# Patient Record
Sex: Female | Born: 1935 | Race: Black or African American | Hispanic: No | Smoking: Never smoker
Health system: Southern US, Community
[De-identification: ages and names within clinical notes are randomized; demographics above are authoritative.]

## PROBLEM LIST (undated history)

## (undated) DIAGNOSIS — G309 Alzheimer's disease, unspecified: Secondary | ICD-10-CM

## (undated) DIAGNOSIS — N289 Disorder of kidney and ureter, unspecified: Secondary | ICD-10-CM

## (undated) DIAGNOSIS — R51 Headache: Secondary | ICD-10-CM

## (undated) DIAGNOSIS — I639 Cerebral infarction, unspecified: Secondary | ICD-10-CM

## (undated) DIAGNOSIS — E559 Vitamin D deficiency, unspecified: Secondary | ICD-10-CM

## (undated) DIAGNOSIS — M1990 Unspecified osteoarthritis, unspecified site: Secondary | ICD-10-CM

## (undated) DIAGNOSIS — G459 Transient cerebral ischemic attack, unspecified: Secondary | ICD-10-CM

## (undated) DIAGNOSIS — F419 Anxiety disorder, unspecified: Secondary | ICD-10-CM

## (undated) DIAGNOSIS — I10 Essential (primary) hypertension: Secondary | ICD-10-CM

## (undated) DIAGNOSIS — F32A Depression, unspecified: Secondary | ICD-10-CM

## (undated) DIAGNOSIS — R443 Hallucinations, unspecified: Secondary | ICD-10-CM

## (undated) DIAGNOSIS — F329 Major depressive disorder, single episode, unspecified: Secondary | ICD-10-CM

## (undated) DIAGNOSIS — F0390 Unspecified dementia without behavioral disturbance: Secondary | ICD-10-CM

## (undated) DIAGNOSIS — K219 Gastro-esophageal reflux disease without esophagitis: Secondary | ICD-10-CM

## (undated) DIAGNOSIS — R519 Headache, unspecified: Secondary | ICD-10-CM

## (undated) DIAGNOSIS — F0280 Dementia in other diseases classified elsewhere without behavioral disturbance: Secondary | ICD-10-CM

## (undated) DIAGNOSIS — E539 Vitamin B deficiency, unspecified: Secondary | ICD-10-CM

## (undated) DIAGNOSIS — R009 Unspecified abnormalities of heart beat: Secondary | ICD-10-CM

## (undated) HISTORY — DX: Depression, unspecified: F32.A

## (undated) HISTORY — DX: Alzheimer's disease, unspecified: G30.9

## (undated) HISTORY — DX: Dementia in other diseases classified elsewhere, unspecified severity, without behavioral disturbance, psychotic disturbance, mood disturbance, and anxiety: F02.80

## (undated) HISTORY — DX: Hallucinations, unspecified: R44.3

## (undated) HISTORY — DX: Headache: R51

## (undated) HISTORY — DX: Unspecified abnormalities of heart beat: R00.9

## (undated) HISTORY — DX: Gastro-esophageal reflux disease without esophagitis: K21.9

## (undated) HISTORY — DX: Unspecified osteoarthritis, unspecified site: M19.90

## (undated) HISTORY — DX: Anxiety disorder, unspecified: F41.9

## (undated) HISTORY — DX: Disorder of kidney and ureter, unspecified: N28.9

## (undated) HISTORY — DX: Vitamin B deficiency, unspecified: E53.9

## (undated) HISTORY — DX: Major depressive disorder, single episode, unspecified: F32.9

## (undated) HISTORY — DX: Vitamin D deficiency, unspecified: E55.9

## (undated) HISTORY — DX: Headache, unspecified: R51.9

---

## 2013-12-12 ENCOUNTER — Emergency Department (HOSPITAL_COMMUNITY)
Admission: EM | Admit: 2013-12-12 | Discharge: 2013-12-12 | Disposition: A | Discharge status: Skilled Nursing Facility | Payer: Medicare Other | Attending: Emergency Medicine | Admitting: Emergency Medicine

## 2013-12-12 ENCOUNTER — Encounter (HOSPITAL_COMMUNITY): Payer: Self-pay | Admitting: Emergency Medicine

## 2013-12-12 DIAGNOSIS — Z79899 Other long term (current) drug therapy: Secondary | ICD-10-CM | POA: Insufficient documentation

## 2013-12-12 DIAGNOSIS — F0390 Unspecified dementia without behavioral disturbance: Secondary | ICD-10-CM | POA: Diagnosis not present

## 2013-12-12 DIAGNOSIS — Z8673 Personal history of transient ischemic attack (TIA), and cerebral infarction without residual deficits: Secondary | ICD-10-CM | POA: Insufficient documentation

## 2013-12-12 DIAGNOSIS — I10 Essential (primary) hypertension: Secondary | ICD-10-CM | POA: Insufficient documentation

## 2013-12-12 DIAGNOSIS — R531 Weakness: Secondary | ICD-10-CM | POA: Insufficient documentation

## 2013-12-12 DIAGNOSIS — Z791 Long term (current) use of non-steroidal anti-inflammatories (NSAID): Secondary | ICD-10-CM | POA: Insufficient documentation

## 2013-12-12 DIAGNOSIS — N390 Urinary tract infection, site not specified: Secondary | ICD-10-CM | POA: Diagnosis not present

## 2013-12-12 DIAGNOSIS — Z8659 Personal history of other mental and behavioral disorders: Secondary | ICD-10-CM | POA: Diagnosis not present

## 2013-12-12 DIAGNOSIS — R29898 Other symptoms and signs involving the musculoskeletal system: Secondary | ICD-10-CM

## 2013-12-12 HISTORY — DX: Unspecified dementia, unspecified severity, without behavioral disturbance, psychotic disturbance, mood disturbance, and anxiety: F03.90

## 2013-12-12 HISTORY — DX: Cerebral infarction, unspecified: I63.9

## 2013-12-12 HISTORY — DX: Essential (primary) hypertension: I10

## 2013-12-12 LAB — URINE MICROSCOPIC-ADD ON

## 2013-12-12 LAB — CBC WITH DIFFERENTIAL/PLATELET
Basophils Absolute: 0 10*3/uL (ref 0.0–0.1)
Basophils Relative: 0 % (ref 0–1)
Eosinophils Absolute: 0.2 10*3/uL (ref 0.0–0.7)
Eosinophils Relative: 3 % (ref 0–5)
HCT: 32.3 % — ABNORMAL LOW (ref 36.0–46.0)
Hemoglobin: 10.6 g/dL — ABNORMAL LOW (ref 12.0–15.0)
Lymphocytes Relative: 17 % (ref 12–46)
Lymphs Abs: 1 10*3/uL (ref 0.7–4.0)
MCH: 31 pg (ref 26.0–34.0)
MCHC: 32.8 g/dL (ref 30.0–36.0)
MCV: 94.4 fL (ref 78.0–100.0)
Monocytes Absolute: 0.7 10*3/uL (ref 0.1–1.0)
Monocytes Relative: 11 % (ref 3–12)
Neutro Abs: 4.3 10*3/uL (ref 1.7–7.7)
Neutrophils Relative %: 69 % (ref 43–77)
Platelets: 244 10*3/uL (ref 150–400)
RBC: 3.42 MIL/uL — ABNORMAL LOW (ref 3.87–5.11)
RDW: 14.1 % (ref 11.5–15.5)
WBC: 6.2 10*3/uL (ref 4.0–10.5)

## 2013-12-12 LAB — COMPREHENSIVE METABOLIC PANEL
ALT: 18 U/L (ref 0–35)
AST: 40 U/L — ABNORMAL HIGH (ref 0–37)
Albumin: 4 g/dL (ref 3.5–5.2)
Alkaline Phosphatase: 98 U/L (ref 39–117)
Anion gap: 14 (ref 5–15)
BUN: 31 mg/dL — ABNORMAL HIGH (ref 6–23)
CO2: 26 mEq/L (ref 19–32)
Calcium: 9.8 mg/dL (ref 8.4–10.5)
Chloride: 101 mEq/L (ref 96–112)
Creatinine, Ser: 1.86 mg/dL — ABNORMAL HIGH (ref 0.50–1.10)
GFR calc Af Amer: 27 mL/min — ABNORMAL LOW (ref >90)
GFR calc non Af Amer: 23 mL/min — ABNORMAL LOW (ref >90)
Glucose, Bld: 107 mg/dL — ABNORMAL HIGH (ref 70–99)
Potassium: 4.1 mEq/L (ref 3.7–5.3)
Sodium: 141 mEq/L (ref 137–147)
Total Bilirubin: 0.3 mg/dL (ref 0.3–1.2)
Total Protein: 7.6 g/dL (ref 6.0–8.3)

## 2013-12-12 LAB — URINALYSIS, ROUTINE W REFLEX MICROSCOPIC
Bilirubin Urine: NEGATIVE
Glucose, UA: NEGATIVE mg/dL
Ketones, ur: NEGATIVE mg/dL
Nitrite: NEGATIVE
Protein, ur: NEGATIVE mg/dL
Specific Gravity, Urine: 1.014 (ref 1.005–1.030)
Urobilinogen, UA: 0.2 mg/dL (ref 0.0–1.0)
pH: 5 (ref 5.0–8.0)

## 2013-12-12 MED ORDER — CEPHALEXIN 500 MG PO CAPS: 500.0000 mg | ORAL_CAPSULE | Freq: Four times a day (QID) | ORAL | Status: DC

## 2013-12-12 NOTE — ED Provider Notes (Addendum)
CSN: [id number]     Arrival date & time 12/12/13  1439 History   First MD Initiated Contact with Patient 12/12/13 1609     Chief Complaint  Patient presents with  . Extremity Weakness     (Consider location/radiation/quality/duration/timing/severity/associated sxs/prior Treatment) Patient is a 80 y.o. female presenting with extremity weakness. The history is provided by the patient.  Extremity Weakness This is a new problem. Episode onset: 2 weeks. The problem occurs constantly. The problem has been gradually improving. Pertinent negatives include no chest pain, no abdominal pain, no headaches and no shortness of breath. Associated symptoms comments: When she holds things she is dropping them and states hand grip feels weak.  Before started to feel weak she had some numbness, tingling and pain in the right 1st-3rd finger.  She denies facial or lower ext numbness or weakness.  No speech or swallowing problems.  Denies CP, SOB, cough, abd pain, nausea or diet changes.  She recently was started on tramadol and since that time was having gait unsteadiness which has improved in the last 4 days since that medication was stopped. Exacerbated by: gripping. The symptoms are relieved by rest. She has tried nothing for the symptoms. The treatment provided no relief.    Past Medical History  Diagnosis Date  . Stroke   . Hypertension   . Dementia    History reviewed. No pertinent past surgical history. History reviewed. No pertinent family history. History  Substance Use Topics  . Smoking status: Never Smoker   . Smokeless tobacco: Not on file  . Alcohol Use: No   OB History    No data available     Review of Systems  Respiratory: Negative for shortness of breath.   Cardiovascular: Negative for chest pain.  Gastrointestinal: Negative for abdominal pain.  Musculoskeletal: Positive for extremity weakness.  Neurological: Negative for headaches.  All other systems reviewed and are  negative.     Allergies  Review of patient's allergies indicates no known allergies.  Home Medications   Prior to Admission medications   Medication Sig Start Date End Date Taking? Authorizing Provider  acetaminophen (TYLENOL) 325 MG tablet Take 650 mg by mouth every 4 (four) hours as needed for moderate pain.   Yes Historical Provider, MD  atorvastatin (LIPITOR) 20 MG tablet Take 20 mg by mouth daily at 6 PM.   Yes Historical Provider, MD  clopidogrel (PLAVIX) 75 MG tablet Take 75 mg by mouth daily.   Yes Historical Provider, MD  diclofenac sodium (VOLTAREN) 1 % GEL Apply 1 g topically 2 (two) times daily.   Yes Historical Provider, MD  gabapentin (NEURONTIN) 400 MG capsule Take 400 mg by mouth 3 (three) times daily.   Yes Historical Provider, MD  guaifenesin (ROBITUSSIN) 100 MG/5ML syrup Take 200 mg by mouth every 4 (four) hours as needed for cough.   Yes Historical Provider, MD  hydrochlorothiazide (HYDRODIURIL) 25 MG tablet Take 25 mg by mouth daily.   Yes Historical Provider, MD  ibuprofen (ADVIL,MOTRIN) 600 MG tablet Take 600 mg by mouth 3 (three) times daily as needed for moderate pain.   Yes Historical Provider, MD  lisinopril (PRINIVIL,ZESTRIL) 40 MG tablet Take 40 mg by mouth daily.   Yes Historical Provider, MD  phenol (CHLORASEPTIC) 1.4 % LIQD Use as directed 2 sprays in the mouth or throat every 4 (four) hours as needed for throat irritation / pain (for 48 hours).   Yes Historical Provider, MD  traMADol (ULTRAM) 50 MG tablet Take  50 mg by mouth 3 (three) times daily.   Yes Historical Provider, MD   BP 124/60 mmHg  Pulse 80  Temp(Src) 98.8 F (37.1 C) (Oral)  Resp 17  Ht 5\' 3"  (1.6 m)  Wt 153 lb (69.4 kg)  BMI 27.11 kg/m2  SpO2 98% Physical Exam  Constitutional: She is oriented to person, place, and time. She appears well-developed and well-nourished. No distress.  HENT:  Head: Normocephalic and atraumatic.  Mouth/Throat: Oropharynx is clear and moist.  Eyes:  Conjunctivae and EOM are normal. Pupils are equal, round, and reactive to light.  Neck: Normal range of motion. Neck supple.  Cardiovascular: Normal rate, regular rhythm and intact distal pulses.   No murmur heard. Pulmonary/Chest: Effort normal and breath sounds normal. No respiratory distress. She has no wheezes. She has no rales.  Abdominal: Soft. She exhibits no distension. There is no tenderness. There is no rebound and no guarding.  Musculoskeletal: Normal range of motion. She exhibits no edema or tenderness.  Neurological: She is alert and oriented to person, place, and time. No cranial nerve deficit or sensory deficit.  5/5 strength in lower ext bilaterally.  5/5 hand grip and bicep strength bilaterally.  4/5 tricep strength on right and 5/5 on the left.  Normal hand sensation   Skin: Skin is warm and dry. No rash noted. No erythema.  Psychiatric: She has a normal mood and affect. Her behavior is normal.  Nursing note and vitals reviewed.   ED Course  Procedures (including critical care time) Labs Review Labs Reviewed  CBC WITH DIFFERENTIAL - Abnormal; Notable for the following:    RBC 3.42 (*)    Hemoglobin 10.6 (*)    HCT 32.3 (*)    All other components within normal limits  URINALYSIS, ROUTINE W REFLEX MICROSCOPIC - Abnormal; Notable for the following:    APPearance CLOUDY (*)    Hgb urine dipstick SMALL (*)    Leukocytes, UA LARGE (*)    All other components within normal limits  COMPREHENSIVE METABOLIC PANEL - Abnormal; Notable for the following:    Glucose, Bld 107 (*)    BUN 31 (*)    Creatinine, Ser 1.86 (*)    AST 40 (*)    GFR calc non Af Amer 23 (*)    GFR calc Af Amer 27 (*)    All other components within normal limits  URINE MICROSCOPIC-ADD ON - Abnormal; Notable for the following:    Squamous Epithelial / LPF FEW (*)    Bacteria, UA FEW (*)    All other components within normal limits    Imaging Review No results found.   EKG Interpretation None       MDM   Final diagnoses:  Right hand weakness  UTI (lower urinary tract infection)    Patient presenting with vague right hand weakness for the last 2 weeks. States that she has started dropping things when she is holding them in her handgrip feels weak. Also she noted some unsteadiness with gait over the last month which has improved greatly since the new tramadol they started her on at the beginning of the month was stopped in the last 4 days. She does not know if that has helped with her hand weakness or not. She denies any swallowing, speech difficulty, no numbness in the lower extremity or leg weakness. Denies headaches. No cardiac, respiratory or GI complaints at this time. On exam of note patient has mild weakness when testing the right tricep but  otherwise bicep and handgrip strength 5 out of 5.  Other than having her metabolic DC and starting tramadol she's had no other medication changes. She is otherwise well appearing with normal vital signs. She had a complete stroke workup about one year ago was diagnosed with a TIA.  She states before her hand started to become weak she had numbness tingling and mild pain in the medial nerve distribution. Feel that this could be carpal tunnel syndrome versus higher nerve impingement. Lower suspicion for stroke at this time given patient's symptoms. Discussed doing a head CT and at this point patient and family do not want a CT done.  CBC, CMP, UA all within normal limits. Patient given wrist splint to be worn at night and during the day for support. Encouraged her to continue to abstain from tramadol and follow-up with her doctor if hand weakness does not improve.  7:17 PM Labs with some CKD but o/w normal lytes.  Also pt has UTI.  Will fax prescription to [facility].  Pt d/ced home with velcro wrist splint to f/u with PCP or return if sx worsen.    Gwyneth Sprout, MD 12/12/13 1610  Gwyneth Sprout, MD 12/12/13 2113

## 2013-12-12 NOTE — ED Notes (Signed)
From [facility], reports right hand grip issues for one week. Dropping things with right hand. Has also noticed less steady gait for the last month.

## 2013-12-12 NOTE — ED Notes (Signed)
Attempted to call report to [facility] X 2 with no answer.

## 2013-12-12 NOTE — ED Notes (Signed)
Patient refusing vital signs. Pt is disoriented to place, situation, and time. EDP made aware.

## 2013-12-12 NOTE — ED Notes (Signed)
Pt refusing vital sign recheck ?

## 2014-04-19 ENCOUNTER — Other Ambulatory Visit: Payer: Self-pay | Admitting: Geriatric Medicine

## 2014-04-19 DIAGNOSIS — N6320 Unspecified lump in the left breast, unspecified quadrant: Secondary | ICD-10-CM

## 2014-05-03 ENCOUNTER — Ambulatory Visit
Admission: RE | Admit: 2014-05-03 | Discharge: 2014-05-03 | Disposition: A | Discharge status: Home / Self Care | Payer: Medicare Other | Source: Ambulatory Visit | Attending: Geriatric Medicine | Admitting: Geriatric Medicine

## 2014-05-03 DIAGNOSIS — N6320 Unspecified lump in the left breast, unspecified quadrant: Secondary | ICD-10-CM

## 2014-05-28 ENCOUNTER — Encounter (HOSPITAL_COMMUNITY): Payer: Self-pay | Admitting: Emergency Medicine

## 2014-05-28 ENCOUNTER — Emergency Department (HOSPITAL_COMMUNITY): Payer: Medicare Other

## 2014-05-28 ENCOUNTER — Emergency Department (HOSPITAL_COMMUNITY)
Admission: EM | Admit: 2014-05-28 | Discharge: 2014-05-28 | Disposition: A | Discharge status: Home / Self Care | Payer: Medicare Other | Attending: Emergency Medicine | Admitting: Emergency Medicine

## 2014-05-28 DIAGNOSIS — R112 Nausea with vomiting, unspecified: Secondary | ICD-10-CM | POA: Diagnosis present

## 2014-05-28 DIAGNOSIS — Z79899 Other long term (current) drug therapy: Secondary | ICD-10-CM | POA: Insufficient documentation

## 2014-05-28 DIAGNOSIS — F0390 Unspecified dementia without behavioral disturbance: Secondary | ICD-10-CM | POA: Insufficient documentation

## 2014-05-28 DIAGNOSIS — I10 Essential (primary) hypertension: Secondary | ICD-10-CM | POA: Insufficient documentation

## 2014-05-28 DIAGNOSIS — Z791 Long term (current) use of non-steroidal anti-inflammatories (NSAID): Secondary | ICD-10-CM | POA: Diagnosis not present

## 2014-05-28 DIAGNOSIS — Z8673 Personal history of transient ischemic attack (TIA), and cerebral infarction without residual deficits: Secondary | ICD-10-CM | POA: Insufficient documentation

## 2014-05-28 LAB — URINALYSIS, ROUTINE W REFLEX MICROSCOPIC
Bilirubin Urine: NEGATIVE
Glucose, UA: NEGATIVE mg/dL
Hgb urine dipstick: NEGATIVE
Ketones, ur: NEGATIVE mg/dL
Leukocytes, UA: NEGATIVE
Nitrite: NEGATIVE
Protein, ur: NEGATIVE mg/dL
Specific Gravity, Urine: 1.016 (ref 1.005–1.030)
Urobilinogen, UA: 0.2 mg/dL (ref 0.0–1.0)
pH: 5.5 (ref 5.0–8.0)

## 2014-05-28 LAB — CBC WITH DIFFERENTIAL/PLATELET
Basophils Absolute: 0 10*3/uL (ref 0.0–0.1)
Basophils Relative: 1 % (ref 0–1)
Eosinophils Absolute: 0.2 10*3/uL (ref 0.0–0.7)
Eosinophils Relative: 3 % (ref 0–5)
HCT: 32.8 % — ABNORMAL LOW (ref 36.0–46.0)
Hemoglobin: 10.6 g/dL — ABNORMAL LOW (ref 12.0–15.0)
Lymphocytes Relative: 14 % (ref 12–46)
Lymphs Abs: 0.8 10*3/uL (ref 0.7–4.0)
MCH: 30.5 pg (ref 26.0–34.0)
MCHC: 32.3 g/dL (ref 30.0–36.0)
MCV: 94.3 fL (ref 78.0–100.0)
Monocytes Absolute: 0.5 10*3/uL (ref 0.1–1.0)
Monocytes Relative: 9 % (ref 3–12)
Neutro Abs: 4.1 10*3/uL (ref 1.7–7.7)
Neutrophils Relative %: 73 % (ref 43–77)
Platelets: 175 10*3/uL (ref 150–400)
RBC: 3.48 MIL/uL — ABNORMAL LOW (ref 3.87–5.11)
RDW: 14.5 % (ref 11.5–15.5)
WBC: 5.6 10*3/uL (ref 4.0–10.5)

## 2014-05-28 LAB — COMPREHENSIVE METABOLIC PANEL
ALT: 10 U/L (ref 0–35)
AST: 18 U/L (ref 0–37)
Albumin: 4.3 g/dL (ref 3.5–5.2)
Alkaline Phosphatase: 84 U/L (ref 39–117)
Anion gap: 8 (ref 5–15)
BUN: 33 mg/dL — ABNORMAL HIGH (ref 6–23)
CO2: 27 mmol/L (ref 19–32)
Calcium: 9.2 mg/dL (ref 8.4–10.5)
Chloride: 109 mmol/L (ref 96–112)
Creatinine, Ser: 1.44 mg/dL — ABNORMAL HIGH (ref 0.50–1.10)
GFR calc Af Amer: 37 mL/min — ABNORMAL LOW (ref >90)
GFR calc non Af Amer: 32 mL/min — ABNORMAL LOW (ref >90)
Glucose, Bld: 104 mg/dL — ABNORMAL HIGH (ref 70–99)
Potassium: 3.8 mmol/L (ref 3.5–5.1)
Sodium: 144 mmol/L (ref 135–145)
Total Bilirubin: 0.6 mg/dL (ref 0.3–1.2)
Total Protein: 7.5 g/dL (ref 6.0–8.3)

## 2014-05-28 LAB — LIPASE, BLOOD: Lipase: 22 U/L (ref 11–59)

## 2014-05-28 MED ORDER — ONDANSETRON HCL 4 MG/2ML IJ SOLN
4.0000 mg | Freq: Once | INTRAMUSCULAR | Status: AC
  Administered 2014-05-28: 4 mg via INTRAVENOUS
  Filled 2014-05-28: qty 2

## 2014-05-28 MED ORDER — SODIUM CHLORIDE 0.9 % IV SOLN: 1000.0000 mL | INTRAVENOUS | Status: DC

## 2014-05-28 MED ORDER — SODIUM CHLORIDE 0.9 % IV SOLN
1000.0000 mL | Freq: Once | INTRAVENOUS | Status: AC
  Administered 2014-05-28: 1000 mL via INTRAVENOUS

## 2014-05-28 MED ORDER — ONDANSETRON HCL 4 MG PO TABS: 4.0000 mg | ORAL_TABLET | Freq: Four times a day (QID) | ORAL | Status: DC

## 2014-05-28 NOTE — ED Notes (Signed)
Pt refused discharge Vital signs. Pt is agitated. NT with patient.

## 2014-05-28 NOTE — ED Provider Notes (Signed)
CSN: [id number]     Arrival date & time 05/28/14  1746 History   First MD Initiated Contact with Patient 05/28/14 1749     Chief Complaint  Patient presents with  . Emesis   Patient is a 81 y.o. female presenting with vomiting. The history is provided by the patient.  Emesis Severity:  Mild (Pt states she was able to eat breakfast today.) Duration:  1 day Timing:  Intermittent Number of daily episodes:  1 Quality:  Unable to specify Chronicity:  New Recent urination:  Normal Relieved by:  Nothing Worsened by:  Nothing tried Associated symptoms: no abdominal pain, no chills, no cough, no diarrhea, no fever and no URI   She also had a fall a couple of days ago but she denies hitting her head.  No loc. She denies hip pain although it is noted in the nursing triage note.  Past Medical History  Diagnosis Date  . Stroke   . Hypertension   . Dementia    History reviewed. No pertinent past surgical history. No family history on file. History  Substance Use Topics  . Smoking status: Never Smoker   . Smokeless tobacco: Not on file  . Alcohol Use: No   OB History    No data available     Review of Systems  Constitutional: Negative for chills.  Gastrointestinal: Positive for vomiting. Negative for abdominal pain and diarrhea.      Allergies  Review of patient's allergies indicates no known allergies.  Home Medications   Prior to Admission medications   Medication Sig Start Date End Date Taking? Authorizing Provider  acetaminophen (TYLENOL) 325 MG tablet Take 650 mg by mouth every 6 (six) hours as needed for moderate pain (no more than 4gms of tylenol in 24 hours).    Yes Historical Provider, MD  atorvastatin (LIPITOR) 20 MG tablet Take 20 mg by mouth daily at 6 PM.   Yes Historical Provider, MD  benztropine (COGENTIN) 1 MG tablet Take 1 mg by mouth daily.   Yes Historical Provider, MD  Cholecalciferol (VITAMIN D-3) 1000 UNITS CAPS Take 1 capsule by mouth daily.   Yes  Historical Provider, MD  clopidogrel (PLAVIX) 75 MG tablet Take 75 mg by mouth daily.   Yes Historical Provider, MD  diclofenac sodium (VOLTAREN) 1 % GEL Apply 1 g topically 2 (two) times daily.   Yes Historical Provider, MD  docusate sodium (COLACE) 100 MG capsule Take 200 mg by mouth 2 (two) times daily.   Yes Historical Provider, MD  gabapentin (NEURONTIN) 100 MG capsule Take 300 mg by mouth 2 (two) times daily.   Yes Historical Provider, MD  guaifenesin (ROBITUSSIN) 100 MG/5ML syrup Take 200 mg by mouth every 4 (four) hours as needed for cough.   Yes Historical Provider, MD  hydrochlorothiazide (HYDRODIURIL) 25 MG tablet Take 25 mg by mouth daily.   Yes Historical Provider, MD  hydrocortisone cream 1 % Apply 1 application topically 2 (two) times daily as needed for itching (and apply to affected area bid until healed).    Yes Historical Provider, MD  ibuprofen (ADVIL,MOTRIN) 600 MG tablet Take 600 mg by mouth 3 (three) times daily as needed for moderate pain.   Yes Historical Provider, MD  lisinopril (PRINIVIL,ZESTRIL) 40 MG tablet Take 40 mg by mouth daily.   Yes Historical Provider, MD  loratadine (CLARITIN) 10 MG tablet Take 10 mg by mouth daily.   Yes Historical Provider, MD  memantine (NAMENDA XR) 28 MG CP24 24  hr capsule Take 28 mg by mouth at bedtime.   Yes Historical Provider, MD  Nutritional Supplements (NUTRITIONAL DRINK PO) Take 1 Can by mouth 2 (two) times daily between meals.   Yes Historical Provider, MD  oxyCODONE-acetaminophen (PERCOCET/ROXICET) 5-325 MG per tablet Take 0.5 tablets by mouth 3 (three) times daily.   Yes Historical Provider, MD  phenol (CHLORASEPTIC) 1.4 % LIQD Use as directed 2 sprays in the mouth or throat every 4 (four) hours as needed for throat irritation / pain (for 48 hours).   Yes Historical Provider, MD  sertraline (ZOLOFT) 50 MG tablet Take 50 mg by mouth daily.   Yes Historical Provider, MD  cephALEXin (KEFLEX) 500 MG capsule Take 1 capsule (500 mg total)  by mouth 4 (four) times daily. Patient not taking: Reported on 05/28/2014 12/12/13   Gwyneth Sprout, MD  ondansetron (ZOFRAN) 4 MG tablet Take 1 tablet (4 mg total) by mouth every 6 (six) hours. 05/28/14   Linwood Dibbles, MD  traMADol (ULTRAM) 50 MG tablet Take 50 mg by mouth 3 (three) times daily.    Historical Provider, MD   BP 110/67 mmHg  Pulse 80  Temp(Src) 98.7 F (37.1 C) (Oral)  Resp 18  SpO2 96% Physical Exam  Constitutional: No distress.  HENT:  Head: Normocephalic and atraumatic.  Right Ear: External ear normal.  Left Ear: External ear normal.  Eyes: Conjunctivae are normal. Right eye exhibits no discharge. Left eye exhibits no discharge. No scleral icterus.  Neck: Neck supple. No tracheal deviation present.  Cardiovascular: Normal rate, regular rhythm and intact distal pulses.   Pulmonary/Chest: Effort normal and breath sounds normal. No stridor. No respiratory distress. She has no wheezes. She has no rales.  Abdominal: Soft. Bowel sounds are normal. She exhibits no distension. There is no tenderness. There is no rebound and no guarding.  Musculoskeletal: She exhibits no edema or tenderness.  Neurological: She is alert. She has normal strength. No cranial nerve deficit (no facial droop, extraocular movements intact, no slurred speech) or sensory deficit. She exhibits normal muscle tone. She displays no seizure activity. Coordination normal.  Skin: Skin is warm and dry. No rash noted.  Psychiatric: She has a normal mood and affect.  Nursing note and vitals reviewed.   ED Course  Procedures (including critical care time) Labs Review Labs Reviewed  CBC WITH DIFFERENTIAL/PLATELET - Abnormal; Notable for the following:    RBC 3.48 (*)    Hemoglobin 10.6 (*)    HCT 32.8 (*)    All other components within normal limits  COMPREHENSIVE METABOLIC PANEL - Abnormal; Notable for the following:    Glucose, Bld 104 (*)    BUN 33 (*)    Creatinine, Ser 1.44 (*)    GFR calc non Af  Amer 32 (*)    GFR calc Af Amer 37 (*)    All other components within normal limits  LIPASE, BLOOD  URINALYSIS, ROUTINE W REFLEX MICROSCOPIC    Imaging Review Ct Head Wo Contrast  05/28/2014   CLINICAL DATA:  Emesis.  Dementia.  Fall 2 days ago.  EXAM: CT HEAD WITHOUT CONTRAST  TECHNIQUE: Contiguous axial images were obtained from the base of the skull through the vertex without intravenous contrast.  COMPARISON:  None.  FINDINGS: Sinuses/Soft tissues: Left sphenoid sinus mucous retention cyst or polyp. Clear mastoid air cells.  Intracranial: Cerebral atrophy is moderate. Mild low density in the periventricular white matter likely related to small vessel disease. No mass lesion, hemorrhage, hydrocephalus, acute  infarct, intra-axial, or extra-axial fluid collection.  IMPRESSION: 1.  No acute intracranial abnormality. 2.  Cerebral atrophy and small vessel ischemic change.   Electronically Signed   By: Jeronimo Greaves M.D.   On: 05/28/2014 18:59   Dg Abd Acute W/chest  05/28/2014   CLINICAL DATA:  Emesis.  Dementia.  EXAM: DG ABDOMEN ACUTE W/ 1V CHEST  COMPARISON:  None.  FINDINGS: Frontal view of the chest demonstrates chin overlies the apices, greater on the left. Normal heart size. No pleural effusion or pneumothorax. Low lung volumes with resultant pulmonary interstitial prominence. No lobar consolidation.  Abdominal films demonstrate No free intraperitoneal air or significant air-fluid levels on right-sided decubitus imaging. No bowel distension. Phleboliths in the left hemipelvis. Aortic atherosclerosis.  IMPRESSION: No acute process in the abdomen/pelvis.  Cardiomegaly and low lung volumes.   Electronically Signed   By: Jeronimo Greaves M.D.   On: 05/28/2014 19:02    MDM   Final diagnoses:  Non-intractable vomiting with nausea, vomiting of unspecified type   Pt has had no vomiting in the ED.  She denies any abdominal pain.  No signs of head injury or stroke on CT scan.  Pt remains asymptomatic and  is ready to go home.  She has been able to walk in her room and bear weight without difficulty.    Linwood Dibbles, MD 05/28/14 2102

## 2014-05-28 NOTE — ED Notes (Signed)
3 failed attempts to reach [facility] on [address]

## 2014-05-28 NOTE — ED Notes (Signed)
Bed: [id number] Expected date: 05/28/14 Expected time: 5:30 PM Means of arrival: Ambulance Comments: vomiting

## 2014-05-28 NOTE — ED Notes (Signed)
Per ems from [facility], [address], per staff pt had 1 episode of emesis. Pt hx dementia. Per ems pt is oriented to person, place and situation. Pt is alert. Also pt had fall 2 days, right hip pain painful only with weight baring. No loc when fall. No anticoagulants.

## 2014-05-28 NOTE — Discharge Instructions (Signed)

## 2014-05-28 NOTE — ED Notes (Signed)
PTAR at bedside 

## 2014-06-04 ENCOUNTER — Emergency Department (HOSPITAL_COMMUNITY): Payer: Medicare Other

## 2014-06-04 ENCOUNTER — Inpatient Hospital Stay (HOSPITAL_COMMUNITY)
Admission: EM | Admit: 2014-06-04 | Discharge: 2014-06-06 | DRG: 071 | Disposition: A | Discharge status: Home / Self Care | Payer: Medicare Other | Attending: Internal Medicine | Admitting: Internal Medicine

## 2014-06-04 ENCOUNTER — Encounter (HOSPITAL_COMMUNITY): Payer: Self-pay | Admitting: Emergency Medicine

## 2014-06-04 DIAGNOSIS — Z8673 Personal history of transient ischemic attack (TIA), and cerebral infarction without residual deficits: Secondary | ICD-10-CM

## 2014-06-04 DIAGNOSIS — R519 Headache, unspecified: Secondary | ICD-10-CM | POA: Diagnosis present

## 2014-06-04 DIAGNOSIS — I129 Hypertensive chronic kidney disease with stage 1 through stage 4 chronic kidney disease, or unspecified chronic kidney disease: Secondary | ICD-10-CM | POA: Diagnosis present

## 2014-06-04 DIAGNOSIS — D649 Anemia, unspecified: Secondary | ICD-10-CM | POA: Diagnosis present

## 2014-06-04 DIAGNOSIS — Z79899 Other long term (current) drug therapy: Secondary | ICD-10-CM

## 2014-06-04 DIAGNOSIS — G9341 Metabolic encephalopathy: Principal | ICD-10-CM | POA: Diagnosis present

## 2014-06-04 DIAGNOSIS — N183 Chronic kidney disease, stage 3 (moderate): Secondary | ICD-10-CM | POA: Diagnosis present

## 2014-06-04 DIAGNOSIS — F0390 Unspecified dementia without behavioral disturbance: Secondary | ICD-10-CM | POA: Diagnosis present

## 2014-06-04 DIAGNOSIS — R4182 Altered mental status, unspecified: Secondary | ICD-10-CM | POA: Diagnosis not present

## 2014-06-04 DIAGNOSIS — Z7902 Long term (current) use of antithrombotics/antiplatelets: Secondary | ICD-10-CM

## 2014-06-04 DIAGNOSIS — N19 Unspecified kidney failure: Secondary | ICD-10-CM

## 2014-06-04 DIAGNOSIS — G459 Transient cerebral ischemic attack, unspecified: Secondary | ICD-10-CM

## 2014-06-04 DIAGNOSIS — N179 Acute kidney failure, unspecified: Secondary | ICD-10-CM | POA: Diagnosis present

## 2014-06-04 DIAGNOSIS — R51 Headache: Secondary | ICD-10-CM

## 2014-06-04 HISTORY — DX: Transient cerebral ischemic attack, unspecified: G45.9

## 2014-06-04 LAB — RETICULOCYTES
RBC.: 3.25 MIL/uL — ABNORMAL LOW (ref 3.87–5.11)
Retic Count, Absolute: 65 10*3/uL (ref 19.0–186.0)
Retic Ct Pct: 2 % (ref 0.4–3.1)

## 2014-06-04 LAB — IRON AND TIBC
Iron: 41 ug/dL (ref 28–170)
Saturation Ratios: 18 % (ref 10.4–31.8)
TIBC: 230 ug/dL — ABNORMAL LOW (ref 250–450)
UIBC: 189 ug/dL

## 2014-06-04 LAB — CBC WITH DIFFERENTIAL/PLATELET
Basophils Absolute: 0 10*3/uL (ref 0.0–0.1)
Basophils Relative: 0 % (ref 0–1)
Eosinophils Absolute: 0.2 10*3/uL (ref 0.0–0.7)
Eosinophils Relative: 5 % (ref 0–5)
HCT: 31.1 % — ABNORMAL LOW (ref 36.0–46.0)
Hemoglobin: 10.1 g/dL — ABNORMAL LOW (ref 12.0–15.0)
Lymphocytes Relative: 19 % (ref 12–46)
Lymphs Abs: 1 10*3/uL (ref 0.7–4.0)
MCH: 30.3 pg (ref 26.0–34.0)
MCHC: 32.5 g/dL (ref 30.0–36.0)
MCV: 93.4 fL (ref 78.0–100.0)
Monocytes Absolute: 0.4 10*3/uL (ref 0.1–1.0)
Monocytes Relative: 7 % (ref 3–12)
Neutro Abs: 3.7 10*3/uL (ref 1.7–7.7)
Neutrophils Relative %: 69 % (ref 43–77)
Platelets: 185 10*3/uL (ref 150–400)
RBC: 3.33 MIL/uL — ABNORMAL LOW (ref 3.87–5.11)
RDW: 14.7 % (ref 11.5–15.5)
WBC: 5.4 10*3/uL (ref 4.0–10.5)

## 2014-06-04 LAB — COMPREHENSIVE METABOLIC PANEL
ALT: 11 U/L — ABNORMAL LOW (ref 14–54)
AST: 25 U/L (ref 15–41)
Albumin: 3.8 g/dL (ref 3.5–5.0)
Alkaline Phosphatase: 75 U/L (ref 38–126)
Anion gap: 10 (ref 5–15)
BUN: 31 mg/dL — ABNORMAL HIGH (ref 6–20)
CO2: 25 mmol/L (ref 22–32)
Calcium: 8.9 mg/dL (ref 8.9–10.3)
Chloride: 106 mmol/L (ref 101–111)
Creatinine, Ser: 3.59 mg/dL — ABNORMAL HIGH (ref 0.44–1.00)
GFR calc Af Amer: 12 mL/min — ABNORMAL LOW (ref >60)
GFR calc non Af Amer: 10 mL/min — ABNORMAL LOW (ref >60)
Glucose, Bld: 98 mg/dL (ref 70–99)
Potassium: 4.4 mmol/L (ref 3.5–5.1)
Sodium: 141 mmol/L (ref 135–145)
Total Bilirubin: 0.6 mg/dL (ref 0.3–1.2)
Total Protein: 6.6 g/dL (ref 6.5–8.1)

## 2014-06-04 LAB — URINALYSIS, ROUTINE W REFLEX MICROSCOPIC
Glucose, UA: NEGATIVE mg/dL
Hgb urine dipstick: NEGATIVE
Ketones, ur: NEGATIVE mg/dL
Leukocytes, UA: NEGATIVE
Nitrite: NEGATIVE
Protein, ur: NEGATIVE mg/dL
Specific Gravity, Urine: 1.017 (ref 1.005–1.030)
Urobilinogen, UA: 0.2 mg/dL (ref 0.0–1.0)
pH: 5 (ref 5.0–8.0)

## 2014-06-04 LAB — I-STAT CG4 LACTIC ACID, ED: Lactic Acid, Venous: 1.2 mmol/L (ref 0.5–2.0)

## 2014-06-04 LAB — TROPONIN I: Troponin I: <0.03 ng/mL (ref <0.031)

## 2014-06-04 LAB — FOLATE: Folate: 9.3 ng/mL (ref >5.9)

## 2014-06-04 LAB — VITAMIN B12: Vitamin B-12: 317 pg/mL (ref 180–914)

## 2014-06-04 LAB — FERRITIN: Ferritin: 28 ng/mL (ref 11–307)

## 2014-06-04 MED ORDER — SODIUM CHLORIDE 0.9 % IV BOLUS (SEPSIS)
500.0000 mL | Freq: Once | INTRAVENOUS | Status: AC
  Administered 2014-06-04: 500 mL via INTRAVENOUS

## 2014-06-04 MED ORDER — NALOXONE HCL 0.4 MG/ML IJ SOLN
0.4000 mg | Freq: Once | INTRAMUSCULAR | Status: AC
  Administered 2014-06-04: 0.4 mg via INTRAVENOUS
  Filled 2014-06-04: qty 1

## 2014-06-04 MED ORDER — SODIUM CHLORIDE 0.9 % IV SOLN
INTRAVENOUS | Status: DC
  Administered 2014-06-04: 22:00:00 via INTRAVENOUS

## 2014-06-04 MED ORDER — ONDANSETRON HCL 4 MG/2ML IJ SOLN: 4.0000 mg | Freq: Four times a day (QID) | INTRAMUSCULAR | Status: DC | PRN

## 2014-06-04 MED ORDER — ONDANSETRON HCL 4 MG PO TABS: 4.0000 mg | ORAL_TABLET | Freq: Four times a day (QID) | ORAL | Status: DC | PRN

## 2014-06-04 MED ORDER — HEPARIN SODIUM (PORCINE) 5000 UNIT/ML IJ SOLN
5000.0000 [IU] | Freq: Three times a day (TID) | INTRAMUSCULAR | Status: DC
  Administered 2014-06-04 – 2014-06-06 (×6): 5000 [IU] via SUBCUTANEOUS
  Filled 2014-06-04 (×8): qty 1

## 2014-06-04 MED ORDER — ACETAMINOPHEN 325 MG PO TABS: 650.0000 mg | ORAL_TABLET | Freq: Four times a day (QID) | ORAL | Status: DC | PRN

## 2014-06-04 MED ORDER — WHITE PETROLATUM GEL
Status: DC | PRN
  Filled 2014-06-04: qty 28.35

## 2014-06-04 MED ORDER — CLOPIDOGREL BISULFATE 75 MG PO TABS
75.0000 mg | ORAL_TABLET | Freq: Every day | ORAL | Status: DC
  Administered 2014-06-05 – 2014-06-06 (×2): 75 mg via ORAL
  Filled 2014-06-04 (×2): qty 1

## 2014-06-04 MED ORDER — SODIUM CHLORIDE 0.9 % IJ SOLN
3.0000 mL | Freq: Two times a day (BID) | INTRAMUSCULAR | Status: DC
  Administered 2014-06-04 – 2014-06-06 (×4): 3 mL via INTRAVENOUS

## 2014-06-04 MED ORDER — ALUM & MAG HYDROXIDE-SIMETH 200-200-20 MG/5ML PO SUSP: 30.0000 mL | Freq: Four times a day (QID) | ORAL | Status: DC | PRN

## 2014-06-04 MED ORDER — ACETAMINOPHEN 650 MG RE SUPP: 650.0000 mg | Freq: Four times a day (QID) | RECTAL | Status: DC | PRN

## 2014-06-04 NOTE — ED Notes (Signed)
Dr. Onalee Huaavid at bedside-- pt answering all questions , awake, pleasant, cooperative.

## 2014-06-04 NOTE — H&P (Signed)
PCP:   No primary care provider on file.   Chief Complaint:  confusion  HPI: 81 yo female sent in from SNF for confusion today, not acting normal.  No report of illness.  No fevers.  No n/v/d.  Pt is on narcotics at snf was given narcan no immediate response but now 15 minutes later is improving and responsive.  No swelling.  No complaints of pain.  Pt alert now and much improved.  Denies any pain.  No sob.    Review of Systems:  Positive and negative as per HPI otherwise all other systems are negative  Past Medical History: Past Medical History  Diagnosis Date  . Stroke   . Hypertension   . Dementia   . TIA (transient ischemic attack)    History reviewed. No pertinent past surgical history.  Medications: Prior to Admission medications   Medication Sig Start Date End Date Taking? Authorizing Provider  acetaminophen (TYLENOL) 325 MG tablet Take 650 mg by mouth every 6 (six) hours as needed for moderate pain (no more than 4gms of tylenol in 24 hours).    Yes Historical Provider, MD  benztropine (COGENTIN) 1 MG tablet Take 1 mg by mouth daily.   Yes Historical Provider, MD  Cholecalciferol (VITAMIN D-3) 1000 UNITS CAPS Take 1 capsule by mouth daily.   Yes Historical Provider, MD  clopidogrel (PLAVIX) 75 MG tablet Take 75 mg by mouth daily.   Yes Historical Provider, MD  diclofenac sodium (VOLTAREN) 1 % GEL Apply 1 g topically 2 (two) times daily.   Yes Historical Provider, MD  docusate sodium (COLACE) 100 MG capsule Take 200 mg by mouth 2 (two) times daily.   Yes Historical Provider, MD  gabapentin (NEURONTIN) 100 MG capsule Take 300 mg by mouth 2 (two) times daily.   Yes Historical Provider, MD  guaifenesin (ROBITUSSIN) 100 MG/5ML syrup Take 200 mg by mouth every 4 (four) hours as needed for cough.   Yes Historical Provider, MD  hydrochlorothiazide (HYDRODIURIL) 25 MG tablet Take 25 mg by mouth daily.   Yes Historical Provider, MD  hydrocortisone cream 1 % Apply 1 application  topically 2 (two) times daily as needed for itching (and apply to affected area bid until healed).    Yes Historical Provider, MD  ibuprofen (ADVIL,MOTRIN) 600 MG tablet Take 600 mg by mouth 3 (three) times daily as needed for moderate pain.   Yes Historical Provider, MD  lisinopril (PRINIVIL,ZESTRIL) 40 MG tablet Take 40 mg by mouth daily.   Yes Historical Provider, MD  loratadine (CLARITIN) 10 MG tablet Take 10 mg by mouth daily.   Yes Historical Provider, MD  memantine (NAMENDA XR) 28 MG CP24 24 hr capsule Take 28 mg by mouth at bedtime.   Yes Historical Provider, MD  Nutritional Supplements (NUTRITIONAL DRINK PO) Take 1 Can by mouth 2 (two) times daily between meals.   Yes Historical Provider, MD  ondansetron (ZOFRAN) 4 MG tablet Take 1 tablet (4 mg total) by mouth every 6 (six) hours. 05/28/14  Yes Linwood Dibbles, MD  oxyCODONE-acetaminophen (PERCOCET/ROXICET) 5-325 MG per tablet Take 0.5 tablets by mouth 3 (three) times daily.   Yes Historical Provider, MD  phenol (CHLORASEPTIC) 1.4 % LIQD Use as directed 2 sprays in the mouth or throat every 4 (four) hours as needed for throat irritation / pain (for 48 hours).   Yes Historical Provider, MD  sertraline (ZOLOFT) 50 MG tablet Take 50 mg by mouth daily.   Yes Historical Provider, MD  cephALEXin (  KEFLEX) 500 MG capsule Take 1 capsule (500 mg total) by mouth 4 (four) times daily. Patient not taking: Reported on 05/28/2014 12/12/13   Gwyneth SproutWhitney Plunkett, MD    Allergies:  No Known Allergies  Social History:  reports that she has never smoked. She does not have any smokeless tobacco history on file. She reports that she does not drink alcohol or use illicit drugs.  Family History: History reviewed. No pertinent family history.  Physical Exam: Filed Vitals:   06/04/14 1715 06/04/14 1730 06/04/14 1745 06/04/14 1800  BP: 120/76 107/62 104/60 120/66  Pulse: 70 67 64 68  Temp:      TempSrc:      Resp:    10  Height:      Weight:      SpO2: 98% 100%  97% 97%   General appearance: alert, cooperative and no distress Head: Normocephalic, without obvious abnormality, atraumatic Eyes: negative Nose: Nares normal. Septum midline. Mucosa normal. No drainage or sinus tenderness. Neck: no JVD and supple, symmetrical, trachea midline Lungs: clear to auscultation bilaterally Heart: regular rate and rhythm, S1, S2 normal, no murmur, click, rub or gallop Abdomen: soft, non-tender; bowel sounds normal; no masses,  no organomegaly Extremities: extremities normal, atraumatic, no cyanosis or edema Pulses: 2+ and symmetric Skin: Skin color, texture, turgor normal. No rashes or lesions Neurologic: Grossly normal  MAE.  No focal neurological deficits.    Labs on Admission:   Recent Labs  06/04/14 1511  NA 141  K 4.4  CL 106  CO2 25  GLUCOSE 98  BUN 31*  CREATININE 3.59*  CALCIUM 8.9    Recent Labs  06/04/14 1511  AST 25  ALT 11*  ALKPHOS 75  BILITOT 0.6  PROT 6.6  ALBUMIN 3.8    Recent Labs  06/04/14 1511  WBC 5.4  NEUTROABS 3.7  HGB 10.1*  HCT 31.1*  MCV 93.4  PLT 185   Radiological Exams on Admission: Dg Chest 2 View  06/04/2014   CLINICAL DATA:  81 year old female with altered mental status, weakness and shaking.  EXAM: CHEST  2 VIEW  COMPARISON:  Prior acute abdominal series including chest x-ray 05/28/2014  FINDINGS: Low inspiratory volumes with bibasilar atelectasis. Borderline cardiomegaly. Atherosclerotic and slightly ectatic thoracic aorta. No definite focal airspace consolidation, pleural effusion or pneumothorax. Central bronchitic changes appear similar compared to prior. No acute osseous abnormality.  IMPRESSION: 1. Stable chest x-ray without acute cardiopulmonary process. 2. Low inspiratory volumes with bibasilar atelectasis. 3. Borderline cardiomegaly and aortic atherosclerosis.   Electronically Signed   By: Malachy MoanHeath  McCullough M.D.   On: 06/04/2014 13:27   Ct Head Wo Contrast  06/04/2014   CLINICAL DATA:  Altered  mental status and failure to thrive for 2 weeks, loss of appetite, history hypertension, dementia, stroke  EXAM: CT HEAD WITHOUT CONTRAST  TECHNIQUE: Contiguous axial images were obtained from the base of the skull through the vertex without intravenous contrast.  COMPARISON:  05/28/2014  FINDINGS: Generalized atrophy.  Normal ventricular morphology.  No midline shift or mass effect.  Small vessel chronic ischemic changes of deep cerebral white matter.  Normal appearance of brain parenchyma.  No intracranial hemorrhage, mass lesion, or evidence acute infarction.  No extra-axial fluid collections.  Visualized sinuses clear and bones unremarkable.  IMPRESSION: Atrophy with small vessel chronic ischemic changes of deep cerebral white matter.  No acute intracranial abnormalities.   Electronically Signed   By: Ulyses SouthwardMark  Boles M.D.   On: 06/04/2014 14:18   Assessment/Plan  81 yo female with ams/encephalopathy may be due to medication  Principal Problem:   Metabolic encephalopathy-  No source of infection.   ua and cxr are negative.  Ck troponin.  Responded slowly to narcan and improved now.  Obtain freq neuro checks.  Admit to tele bed.  Hold sedatives.  Active Problems:   AKI (acute kidney injury)-   Ck urine studies and renal ultrasound.  ivf overnight.  I/o monitor closely.  Hold ace and diuretics.   Dementia   Normochromic anemia- ck anemia panel, can be followed as outpt.  No bleeding at this time.   H/O: CVA (cerebrovascular accident)-  Ck mri in am, if positive do further stroke w/u.  Appears ams may be due to medications.  Admit to tele bed.  Full code.  Dianelly Ferran A 06/04/2014, 7:10 PM

## 2014-06-04 NOTE — ED Notes (Signed)
Pt difficult to arouse--- will open eyes with tactile stimulation and repetitive verbal stimulation. Spoke with niece on the phone-- states pt is normally alert/oriented, able to walk on own, has been taking oxycodone-- niece feels that she is taking too much medicine.

## 2014-06-04 NOTE — ED Notes (Signed)
Patient transported to CT 

## 2014-06-04 NOTE — ED Notes (Signed)
Per EMS- Pt here from [place] [facility] with c/o altered mental status/failure to thrive x 2 weeks. Per staff- pt is normally a/o x 4, ambulatory without cane or waler, but for the past 2 weeks has had no appetite, only oriented to self and situation. Staff also reported to EMS dark, foul smelling urine. Patient is hot to touch at this time. [facility].

## 2014-06-04 NOTE — ED Notes (Signed)
Dr. Arlys JohnBrian cook to order 500cc bolus of NaCl IV.

## 2014-06-04 NOTE — ED Notes (Signed)
Report attempted 

## 2014-06-04 NOTE — ED Provider Notes (Signed)
CSN: [id number]     Arrival date & time 06/04/14  1214 History   First MD Initiated Contact with Patient 06/04/14 1231     Chief Complaint  Patient presents with  . Altered Mental Status     (Consider location/radiation/quality/duration/timing/severity/associated sxs/prior Treatment) HPI Comments: Patient with a history of CVA, TIA, and Dementia presents today from [facility] due to altered mental status.  Called staff at the Nursing home to obtain further history.  Staff report that the patient has not been as social and not communicating as well over the past week.  They report that she has been staying in bed more often.  They also report that she was incontinent of stool today, which was new for her.  No new medications.  Staff report that she is on Oxycodone, but has been on this for a while.  They report that she has not had a fever or vomiting, but that her appetite has been decreased.  No recent falls or injuries.  They have not noticed a cough.  Staff report that her vital signs have been stable.  Patient denies any pain at this time, but is unable to provide further history.    The history is provided by the patient.    Past Medical History  Diagnosis Date  . Stroke   . Hypertension   . Dementia   . TIA (transient ischemic attack)    History reviewed. No pertinent past surgical history. History reviewed. No pertinent family history. History  Substance Use Topics  . Smoking status: Never Smoker   . Smokeless tobacco: Not on file  . Alcohol Use: No   OB History    No data available     Review of Systems  Unable to perform ROS: Dementia      Allergies  Review of patient's allergies indicates no known allergies.  Home Medications   Prior to Admission medications   Medication Sig Start Date End Date Taking? Authorizing Provider  acetaminophen (TYLENOL) 325 MG tablet Take 650 mg by mouth every 6 (six) hours as needed for moderate pain (no more than 4gms  of tylenol in 24 hours).    Yes Historical Provider, MD  benztropine (COGENTIN) 1 MG tablet Take 1 mg by mouth daily.   Yes Historical Provider, MD  Cholecalciferol (VITAMIN D-3) 1000 UNITS CAPS Take 1 capsule by mouth daily.   Yes Historical Provider, MD  clopidogrel (PLAVIX) 75 MG tablet Take 75 mg by mouth daily.   Yes Historical Provider, MD  diclofenac sodium (VOLTAREN) 1 % GEL Apply 1 g topically 2 (two) times daily.   Yes Historical Provider, MD  docusate sodium (COLACE) 100 MG capsule Take 200 mg by mouth 2 (two) times daily.   Yes Historical Provider, MD  gabapentin (NEURONTIN) 100 MG capsule Take 300 mg by mouth 2 (two) times daily.   Yes Historical Provider, MD  guaifenesin (ROBITUSSIN) 100 MG/5ML syrup Take 200 mg by mouth every 4 (four) hours as needed for cough.   Yes Historical Provider, MD  hydrochlorothiazide (HYDRODIURIL) 25 MG tablet Take 25 mg by mouth daily.   Yes Historical Provider, MD  hydrocortisone cream 1 % Apply 1 application topically 2 (two) times daily as needed for itching (and apply to affected area bid until healed).    Yes Historical Provider, MD  ibuprofen (ADVIL,MOTRIN) 600 MG tablet Take 600 mg by mouth 3 (three) times daily as needed for moderate pain.   Yes Historical Provider, MD  lisinopril (PRINIVIL,ZESTRIL)  40 MG tablet Take 40 mg by mouth daily.   Yes Historical Provider, MD  loratadine (CLARITIN) 10 MG tablet Take 10 mg by mouth daily.   Yes Historical Provider, MD  memantine (NAMENDA XR) 28 MG CP24 24 hr capsule Take 28 mg by mouth at bedtime.   Yes Historical Provider, MD  Nutritional Supplements (NUTRITIONAL DRINK PO) Take 1 Can by mouth 2 (two) times daily between meals.   Yes Historical Provider, MD  ondansetron (ZOFRAN) 4 MG tablet Take 1 tablet (4 mg total) by mouth every 6 (six) hours. 05/28/14  Yes Linwood DibblesJon Knapp, MD  oxyCODONE-acetaminophen (PERCOCET/ROXICET) 5-325 MG per tablet Take 0.5 tablets by mouth 3 (three) times daily.   Yes Historical  Provider, MD  phenol (CHLORASEPTIC) 1.4 % LIQD Use as directed 2 sprays in the mouth or throat every 4 (four) hours as needed for throat irritation / pain (for 48 hours).   Yes Historical Provider, MD  sertraline (ZOLOFT) 50 MG tablet Take 50 mg by mouth daily.   Yes Historical Provider, MD  cephALEXin (KEFLEX) 500 MG capsule Take 1 capsule (500 mg total) by mouth 4 (four) times daily. Patient not taking: Reported on 05/28/2014 12/12/13   Gwyneth SproutWhitney Plunkett, MD   BP 127/70 mmHg  Pulse 80  Temp(Src) 98.4 F (36.9 C) (Oral)  Resp 12  Ht 5' (1.524 m)  Wt 150 lb (68.04 kg)  BMI 29.30 kg/m2  SpO2 99% Physical Exam  Constitutional: She appears well-developed and well-nourished.  HENT:  Head: Normocephalic and atraumatic.  Mouth/Throat: Oropharynx is clear and moist.  Eyes: EOM are normal. Pupils are equal, round, and reactive to light.  Neck: Normal range of motion. Neck supple.  Cardiovascular: Normal rate, regular rhythm and normal heart sounds.   Pulmonary/Chest: Effort normal and breath sounds normal.  Abdominal: Soft. Bowel sounds are normal. She exhibits no distension and no mass. There is no tenderness. There is no rebound and no guarding.  Musculoskeletal: Normal range of motion.  Neurological: She is alert. No cranial nerve deficit.  Muscle strength is symmetric  Skin: Skin is warm and dry.  Psychiatric: She has a normal mood and affect.  Nursing note and vitals reviewed.   ED Course  Procedures (including critical care time) Labs Review Labs Reviewed  URINALYSIS, ROUTINE W REFLEX MICROSCOPIC - Abnormal; Notable for the following:    Bilirubin Urine SMALL (*)    All other components within normal limits  CBC WITH DIFFERENTIAL/PLATELET  COMPREHENSIVE METABOLIC PANEL  I-STAT CG4 LACTIC ACID, ED    Imaging Review Dg Chest 2 View  06/04/2014   CLINICAL DATA:  81 year old female with altered mental status, weakness and shaking.  EXAM: CHEST  2 VIEW  COMPARISON:  Prior acute  abdominal series including chest x-ray 05/28/2014  FINDINGS: Low inspiratory volumes with bibasilar atelectasis. Borderline cardiomegaly. Atherosclerotic and slightly ectatic thoracic aorta. No definite focal airspace consolidation, pleural effusion or pneumothorax. Central bronchitic changes appear similar compared to prior. No acute osseous abnormality.  IMPRESSION: 1. Stable chest x-ray without acute cardiopulmonary process. 2. Low inspiratory volumes with bibasilar atelectasis. 3. Borderline cardiomegaly and aortic atherosclerosis.   Electronically Signed   By: Malachy MoanHeath  McCullough M.D.   On: 06/04/2014 13:27   Ct Head Wo Contrast  06/04/2014   CLINICAL DATA:  Altered mental status and failure to thrive for 2 weeks, loss of appetite, history hypertension, dementia, stroke  EXAM: CT HEAD WITHOUT CONTRAST  TECHNIQUE: Contiguous axial images were obtained from the base of the skull  through the vertex without intravenous contrast.  COMPARISON:  05/28/2014  FINDINGS: Generalized atrophy.  Normal ventricular morphology.  No midline shift or mass effect.  Small vessel chronic ischemic changes of deep cerebral white matter.  Normal appearance of brain parenchyma.  No intracranial hemorrhage, mass lesion, or evidence acute infarction.  No extra-axial fluid collections.  Visualized sinuses clear and bones unremarkable.  IMPRESSION: Atrophy with small vessel chronic ischemic changes of deep cerebral white matter.  No acute intracranial abnormalities.   Electronically Signed   By: Ulyses SouthwardMark  Boles M.D.   On: 06/04/2014 14:18     EKG Interpretation   Date/Time:  Saturday Jun 04 2014 12:28:59 EDT Ventricular Rate:  77 PR Interval:  176 QRS Duration: 82 QT Interval:  408 QTC Calculation: 462 R Axis:   -5 Text Interpretation:  Sinus rhythm Posterior infarct, old Baseline wander  in lead(s) V1 Sinus rhythm Artifact T wave abnormality No significant  change since last tracing Abnormal ekg Confirmed by Gerhard MunchLOCKWOOD, ROBERT   MD  [phone]([id number]) on 06/04/2014 12:58:34 PM      MDM   Final diagnoses:  Altered mental status   Patient presents today from Nursing Home due to AMS.  Staff report that she has not been communicating as well over the past.  Staff report no new changes in medication.  Head CT is negative.  CXR is negative.  UA today is also negative.  CBC unremarkable.  Lactate WNL.  CMP pending.  [facility] Neese, [place]-C will follow up on the results of the CMP and dispo accordingly.    Santiago GladHeather Keefer Soulliere, PA-C 06/06/14 1017  Gerhard Munchobert Lockwood, MD 06/08/14 [phone]

## 2014-06-04 NOTE — ED Provider Notes (Signed)
Patient does not answers questions appropriately. According to nursing home and niece, she is normally reasonably interactive. Creatinine noted to be elevated at greater than 3. CT head, chest x-ray, urinalysis all negative for acute findings. There was a question whether patient had been taking opiates. Narcan 0.4 mg IV administered with no significant change in behavior.  Cathy HutchingBrian Callista Hoh, MD 06/04/14 [phone]

## 2014-06-04 NOTE — ED Notes (Signed)
Pt sleeping soundly -- difficult to arouse, Dr. Adriana Simasook notified -- fluids ordered.

## 2014-06-05 ENCOUNTER — Inpatient Hospital Stay (HOSPITAL_COMMUNITY): Payer: Medicare Other

## 2014-06-05 LAB — BASIC METABOLIC PANEL
Anion gap: 10 (ref 5–15)
BUN: 24 mg/dL — ABNORMAL HIGH (ref 6–20)
CO2: 23 mmol/L (ref 22–32)
Calcium: 8.3 mg/dL — ABNORMAL LOW (ref 8.9–10.3)
Chloride: 112 mmol/L — ABNORMAL HIGH (ref 101–111)
Creatinine, Ser: 2.1 mg/dL — ABNORMAL HIGH (ref 0.44–1.00)
GFR calc Af Amer: 23 mL/min — ABNORMAL LOW (ref >60)
GFR calc non Af Amer: 20 mL/min — ABNORMAL LOW (ref >60)
Glucose, Bld: 92 mg/dL (ref 70–99)
Potassium: 3.9 mmol/L (ref 3.5–5.1)
Sodium: 145 mmol/L (ref 135–145)

## 2014-06-05 LAB — OSMOLALITY, URINE: Osmolality, Ur: 386 mOsm/kg — ABNORMAL LOW (ref 390–1090)

## 2014-06-05 LAB — RAPID URINE DRUG SCREEN, HOSP PERFORMED
Amphetamines: NOT DETECTED
Barbiturates: NOT DETECTED
Benzodiazepines: NOT DETECTED
Cocaine: NOT DETECTED
Opiates: NOT DETECTED
Tetrahydrocannabinol: NOT DETECTED

## 2014-06-05 LAB — CBC
HCT: 28.1 % — ABNORMAL LOW (ref 36.0–46.0)
Hemoglobin: 9.2 g/dL — ABNORMAL LOW (ref 12.0–15.0)
MCH: 30.7 pg (ref 26.0–34.0)
MCHC: 32.7 g/dL (ref 30.0–36.0)
MCV: 93.7 fL (ref 78.0–100.0)
Platelets: 164 10*3/uL (ref 150–400)
RBC: 3 MIL/uL — ABNORMAL LOW (ref 3.87–5.11)
RDW: 14.6 % (ref 11.5–15.5)
WBC: 4.8 10*3/uL (ref 4.0–10.5)

## 2014-06-05 LAB — TROPONIN I
Troponin I: <0.03 ng/mL (ref <0.031)
Troponin I: <0.03 ng/mL (ref <0.031)

## 2014-06-05 LAB — SODIUM, URINE, RANDOM: Sodium, Ur: 81 mmol/L

## 2014-06-05 LAB — MRSA PCR SCREENING: MRSA by PCR: NEGATIVE

## 2014-06-05 LAB — OSMOLALITY: Osmolality: 302 mOsm/kg — ABNORMAL HIGH (ref 275–300)

## 2014-06-05 MED ORDER — HALOPERIDOL LACTATE 5 MG/ML IJ SOLN
2.5000 mg | Freq: Once | INTRAMUSCULAR | Status: AC
  Administered 2014-06-05: 2.5 mg via INTRAVENOUS
  Filled 2014-06-05: qty 1

## 2014-06-05 NOTE — Progress Notes (Signed)
Patient confused this evening pulling at IV and attempting to leave room, patient not easily reoriented and at risk for fall, Dr. Clearence PedSchorr notified and 2.5 mg IV haldol ordered and administered with good effect, posey restraint order also placed, however, restraints not initiated and order discontinued 2/2 medication effect, patient currently resting comfortably, bed alarm minatained, frequent checks made, patient reoriented as needed, will continue to monitor.

## 2014-06-05 NOTE — Evaluation (Signed)
Clinical/Bedside Swallow Evaluation Patient Details  Name: Cathy RacerSusie Beatrice Stiff MRN: [id number] Date of Birth: May 31, 1924  Today's Date: 06/05/2014 Time: SLP Start Time (ACUTE ONLY): 0910 SLP Stop Time (ACUTE ONLY): 0930 SLP Time Calculation (min) (ACUTE ONLY): 20 min  Past Medical History:  Past Medical History  Diagnosis Date  . Stroke   . Hypertension   . Dementia   . TIA (transient ischemic attack)    Past Surgical History: History reviewed. No pertinent past surgical history. HPI:  81 yo female sent in from SNF for confusion today, not acting normal. No report of illness. No fevers. No n/v/d. Pt is on narcotics at snf was given narcan no immediate response but now 15 minutes later is improving and responsive. No swelling. No complaints of pain. Pt alert now and much improved. Denies any pain. No sob. Suspect issues related to medication.  Current head CT shows the following:  Atrophy with small vessel chronic ischemic changes of deep cerebral.  Current chest x-ray shows the following:  Stable chest x-ray without acute cardiopulmonary process`   Assessment / Plan / Recommendation Clinical Impression  Clinical evaluation of swallowing was completed.  The pt's oral/pharyngeal swallow appear functional.  The pt did not present with any overt s/s of aspiration.  Respiratory status remained stable throughout assessment.  Rx regular diet and thin liquids.  Acute ST needs are not identified.  Please reconsult if further ST needs are identified.      Aspiration Risk  Mild    Diet Recommendation  Regular diet with thin liquids  Medication Administration: Whole meds with liquid Compensations: None    Other  Recommendations Oral Care Recommendations: Oral care QID    Swallow Study Prior Functional Status   The patient and the patient's daughter do not report any baseline swallowing issues.      General Date of Onset: 06/04/14 Other Pertinent Information: 81 yo female sent in  from SNF for confusion today, not acting normal. No report of illness. No fevers. No n/v/d. Pt is on narcotics at snf was given narcan no immediate response but now 15 minutes later is improving and responsive. No swelling. No complaints of pain. Pt alert now and much improved. Denies any pain. No sob. Suspect issues related to medication.  Current head CT shows the following:  Atrophy with small vessel chronic ischemic changes of deep cerebral.  Current chest x-ray shows the following:  Stable chest x-ray without acute cardiopulmonary process` Type of Study: Bedside swallow evaluation Previous Swallow Assessment: none noted Diet Prior to this Study: NPO Temperature Spikes Noted: No Respiratory Status: Room air History of Recent Intubation: No Behavior/Cognition: Alert;Cooperative;Pleasant mood Oral Cavity - Dentition: Adequate natural dentition/normal for age Self-Feeding Abilities: Able to feed self;Needs assist Patient Positioning: Upright in bed Baseline Vocal Quality: Normal Volitional Cough: Weak Volitional Swallow: Able to elicit    Oral/Motor/Sensory Function Overall Oral Motor/Sensory Function: Appears within functional limits for tasks assessed Labial ROM: Within Functional Limits Labial Symmetry: Within Functional Limits Labial Strength: Within Functional Limits Lingual ROM: Within Functional Limits Lingual Symmetry: Within Functional Limits Lingual Strength: Within Functional Limits Facial ROM: Within Functional Limits Facial Symmetry: Within Functional Limits Mandible: Within Functional Limits   Ice Chips Ice chips: Within functional limits Presentation: Spoon   Thin Liquid Thin Liquid: Within functional limits Presentation: Cup;Self Fed;Spoon    Nectar Thick Nectar Thick Liquid: Not tested   Honey Thick Honey Thick Liquid: Not tested   Puree Puree: Within functional limits Presentation: Spoon  Solid   GO    Solid: Within functional limits Presentation:  Self Harlon FlorFed       Hallee Mckenny N 06/05/2014,9:39 AM  Dimas AguasMelissa Mylena Sedberry, MA, CCC-SLP Acute Rehab SLP [phone]

## 2014-06-05 NOTE — Progress Notes (Addendum)
[facility]                                                                              Patient Demographics  Cathy Rose, is a 81 y.o. female, DOB - 1924/05/01, ZOX:[id number]  Admit date - 06/04/2014   Admitting Physician Haydee Monicaachal A David, MD  Outpatient Primary MD for the patient is No primary care provider on file.  LOS - 1   Chief Complaint  Patient presents with  . Altered Mental Status      HPI on 06/04/2014 by Dr. Tarry Kosachal David 81 yo female sent in from SNF for confusion today, not acting normal. No report of illness. No fevers. No n/v/d. Pt is on narcotics at snf was given narcan no immediate response but now 15 minutes later is improving and responsive. No swelling. No complaints of pain. Pt alert now and much improved. Denies any pain. No sob.   Assessment & Plan   Metabolic Encephalopathy  -Appears to baseline per daughter -possibly secondary to medications, as patient was given narcan and improved.  -Troponins negative -No leukocytosis, patient afebrile -UA and CXR negative for infection -CT and MRI negative for acute infarct -Pending PT and OT  Acute Kidney Injury on Chronic kidney Disease, stage III  -Creatinie improving, currently 2.1 (was 3.59 upon admission) -Unknown baseline creatinine, however in April 2016, was 1.44 -Continue to monitor BMP -Renal US: normal  Dementia -Appears at baseline -Namenda held  History of CVA -CT and MRI negative for acute infarcts -Continue plavix  Hypertension  -lisinopril and HCTZ held -BP currently stable  Normocytic Anemia -Appears baseline Hb 10, currently 9.2 (possible drop due to dilutional componenet) -Continue to monitor CBC -Currently hemodynamically stable, no evidence of bleeding   Code Status: Full  Family Communication: Daughter at bedside  Disposition Plan: Admitted. Pending PT and OT consults.   Time Spent in minutes   30 minutes  Procedures  None  Consults   None  DVT  Prophylaxis  Heparin  Lab Results  Component Value Date   PLT 164 06/05/2014    Medications  Scheduled Meds: . clopidogrel  75 mg Oral Daily  . heparin  5,000 Units Subcutaneous 3 times per day  . sodium chloride  3 mL Intravenous Q12H   Continuous Infusions:  PRN Meds:.acetaminophen **OR** acetaminophen, alum & mag hydroxide-simeth, ondansetron **OR** ondansetron (ZOFRAN) IV  Antibiotics    Anti-infectives    None      Subjective:   Cathy GuadalajaraSusie Asselin seen and examined today.  Patient feels much better today.  Per daughter, patient is at her baseline.  She denies chest pain, shortness of breath, abdominal pain, nausea, vomiting, diarrhea, constipation.   Objective:   Filed Vitals:   06/04/14 2143 06/04/14 2146 06/05/14 0500 06/05/14 0531  BP: 139/81 127/90  119/70  Pulse: 71 77  59  Temp:    98 F (36.7 C)  TempSrc:    Oral  Resp:    18  Height:      Weight:   64.3 kg (141 lb 12.1 oz)   SpO2: 100% 76%  94%    Wt Readings from Last 3 Encounters:  06/05/14 64.3 kg (141 lb 12.1  oz)  12/12/13 69.4 kg (153 lb)     Intake/Output Summary (Last 24 hours) at 06/05/14 1144 Last data filed at 06/05/14 1001  Gross per 24 hour  Intake   1100 ml  Output      0 ml  Net   1100 ml    Exam  General: Well developed, well nourished, NAD, appears stated age  HEENT: NCAT, PERRLA, EOMI, Anicteic Sclera, mucous membranes moist.   Cardiovascular: S1 S2 auscultated, no rubs, murmurs or gallops. Regular rate and rhythm.  Respiratory: Clear to auscultation bilaterally with equal chest rise  Abdomen: Soft, nontender, nondistended, + bowel sounds  Extremities: warm dry without cyanosis clubbing or edema  Neuro: AAOx3, cranial nerves grossly intact. Strength 5/5 in patient's upper and lower extremities bilaterally  Psych: Normal affect and demeanor with intact judgement and insight  Data Review   Micro Results Recent Results (from the past 240 hour(s))  MRSA PCR Screening      Status: None   Collection Time: 06/04/14  9:28 PM  Result Value Ref Range Status   MRSA by PCR NEGATIVE NEGATIVE Final    Comment:        The GeneXpert MRSA Assay (FDA approved for NASAL specimens only), is one component of a comprehensive MRSA colonization surveillance program. It is not intended to diagnose MRSA infection nor to guide or monitor treatment for MRSA infections.     Radiology Reports Dg Chest 2 View  06/04/2014   CLINICAL DATA:  81 year old female with altered mental status, weakness and shaking.  EXAM: CHEST  2 VIEW  COMPARISON:  Prior acute abdominal series including chest x-ray 05/28/2014  FINDINGS: Low inspiratory volumes with bibasilar atelectasis. Borderline cardiomegaly. Atherosclerotic and slightly ectatic thoracic aorta. No definite focal airspace consolidation, pleural effusion or pneumothorax. Central bronchitic changes appear similar compared to prior. No acute osseous abnormality.  IMPRESSION: 1. Stable chest x-ray without acute cardiopulmonary process. 2. Low inspiratory volumes with bibasilar atelectasis. 3. Borderline cardiomegaly and aortic atherosclerosis.   Electronically Signed   By: Malachy Moan M.D.   On: 06/04/2014 13:27   Ct Head Wo Contrast  06/04/2014   CLINICAL DATA:  Altered mental status and failure to thrive for 2 weeks, loss of appetite, history hypertension, dementia, stroke  EXAM: CT HEAD WITHOUT CONTRAST  TECHNIQUE: Contiguous axial images were obtained from the base of the skull through the vertex without intravenous contrast.  COMPARISON:  05/28/2014  FINDINGS: Generalized atrophy.  Normal ventricular morphology.  No midline shift or mass effect.  Small vessel chronic ischemic changes of deep cerebral white matter.  Normal appearance of brain parenchyma.  No intracranial hemorrhage, mass lesion, or evidence acute infarction.  No extra-axial fluid collections.  Visualized sinuses clear and bones unremarkable.  IMPRESSION: Atrophy with small  vessel chronic ischemic changes of deep cerebral white matter.  No acute intracranial abnormalities.   Electronically Signed   By: Ulyses Southward M.D.   On: 06/04/2014 14:18   Ct Head Wo Contrast  05/28/2014   CLINICAL DATA:  Emesis.  Dementia.  Fall 2 days ago.  EXAM: CT HEAD WITHOUT CONTRAST  TECHNIQUE: Contiguous axial images were obtained from the base of the skull through the vertex without intravenous contrast.  COMPARISON:  None.  FINDINGS: Sinuses/Soft tissues: Left sphenoid sinus mucous retention cyst or polyp. Clear mastoid air cells.  Intracranial: Cerebral atrophy is moderate. Mild low density in the periventricular white matter likely related to small vessel disease. No mass lesion, hemorrhage, hydrocephalus, acute infarct,  intra-axial, or extra-axial fluid collection.  IMPRESSION: 1.  No acute intracranial abnormality. 2.  Cerebral atrophy and small vessel ischemic change.   Electronically Signed   By: Jeronimo Greaves M.D.   On: 05/28/2014 18:59   Mr Brain Wo Contrast  06/05/2014   CLINICAL DATA:  81 year old female with confusion onset yesterday. Failure to thrive for 2 weeks. Initial encounter.  EXAM: MRI HEAD WITHOUT CONTRAST  TECHNIQUE: Multiplanar, multiecho pulse sequences of the brain and surrounding structures were obtained without intravenous contrast.  COMPARISON:  Head CTs without contrast 06/04/2014 and earlier.  FINDINGS: Study is intermittently degraded by motion artifact despite repeated imaging attempts.  Major intracranial vascular flow voids are preserved, diminutive vertebrobasilar junction with evidence of fetal type PCA origins.  No restricted diffusion or evidence of acute infarction. No midline shift, mass effect, or evidence of intracranial mass lesion. No ventriculomegaly. No acute intracranial hemorrhage identified. Negative pituitary and cervicomedullary junction. Scattered and patchy cerebral white matter T2 and FLAIR hyperintensity. No cortical encephalomalacia or chronic  blood products identified. Deep gray matter nuclei, brainstem and cerebellum are within normal limits for age.  Visible internal auditory structures appear normal. Mastoids are clear. Trace paranasal sinus mucosal thickening. Postoperative changes to the globes. Visualized scalp soft tissues are within normal limits. Normal bone marrow signal. Multilevel degenerative spondylolisthesis in the visible cervical spine.  IMPRESSION: No acute intracranial abnormality identified.   Electronically Signed   By: Odessa Fleming M.D.   On: 06/05/2014 07:51   US Renal  06/05/2014   CLINICAL DATA:  Renal failure.  EXAM: RENAL / URINARY TRACT ULTRASOUND COMPLETE  COMPARISON:  None.  FINDINGS: Right Kidney:  Length: 9.7 cm. Echogenicity within normal limits. No mass or hydronephrosis visualized.  Left Kidney:  Length: 10.7 cm. Echogenicity within normal limits. No mass or hydronephrosis visualized.  Bladder:  Appears normal for degree of bladder distention.  IMPRESSION: Normal examination.   Electronically Signed   By: Beckie Salts M.D.   On: 06/05/2014 08:25   Dg Abd Acute W/chest  05/28/2014   CLINICAL DATA:  Emesis.  Dementia.  EXAM: DG ABDOMEN ACUTE W/ 1V CHEST  COMPARISON:  None.  FINDINGS: Frontal view of the chest demonstrates chin overlies the apices, greater on the left. Normal heart size. No pleural effusion or pneumothorax. Low lung volumes with resultant pulmonary interstitial prominence. No lobar consolidation.  Abdominal films demonstrate No free intraperitoneal air or significant air-fluid levels on right-sided decubitus imaging. No bowel distension. Phleboliths in the left hemipelvis. Aortic atherosclerosis.  IMPRESSION: No acute process in the abdomen/pelvis.  Cardiomegaly and low lung volumes.   Electronically Signed   By: Jeronimo Greaves M.D.   On: 05/28/2014 19:02    CBC  Recent Labs Lab 06/04/14 1511 06/05/14 0340  WBC 5.4 4.8  HGB 10.1* 9.2*  HCT 31.1* 28.1*  PLT 185 164  MCV 93.4 93.7  MCH 30.3 30.7    MCHC 32.5 32.7  RDW 14.7 14.6  LYMPHSABS 1.0  --   MONOABS 0.4  --   EOSABS 0.2  --   BASOSABS 0.0  --     Chemistries   Recent Labs Lab 06/04/14 1511 06/05/14 0340  NA 141 145  K 4.4 3.9  CL 106 112*  CO2 25 23  GLUCOSE 98 92  BUN 31* 24*  CREATININE 3.59* 2.10*  CALCIUM 8.9 8.3*  AST 25  --   ALT 11*  --   ALKPHOS 75  --   BILITOT 0.6  --    ------------------------------------------------------------------------------------------------------------------  estimated creatinine clearance is 15.8 mL/min (by C-G formula based on Cr of 2.1). ------------------------------------------------------------------------------------------------------------------ No results for input(s): HGBA1C in the last 72 hours. ------------------------------------------------------------------------------------------------------------------ No results for input(s): CHOL, HDL, LDLCALC, TRIG, CHOLHDL, LDLDIRECT in the last 72 hours. ------------------------------------------------------------------------------------------------------------------ No results for input(s): TSH, T4TOTAL, T3FREE, THYROIDAB in the last 72 hours.  Invalid input(s): FREET3 ------------------------------------------------------------------------------------------------------------------  Recent Labs  06/04/14 2230  VITAMINB12 317  FOLATE 9.3  FERRITIN 28  TIBC 230*  IRON 41  RETICCTPCT 2.0    Coagulation profile No results for input(s): INR, PROTIME in the last 168 hours.  No results for input(s): DDIMER in the last 72 hours.  Cardiac Enzymes  Recent Labs Lab 06/04/14 2230 06/05/14 0340 06/05/14 0940  TROPONINI <0.03 <0.03 <0.03   ------------------------------------------------------------------------------------------------------------------ Invalid input(s): POCBNP    Anniah Glick D.O. on 06/05/2014 at 11:44 AM  Between 7am to 7pm - Pager - [phone]  After 7pm go to [email or web address] -  password [email or web address]  And look for the night coverage person covering for me after hours  [facility] Office  [phone]

## 2014-06-05 NOTE — Progress Notes (Signed)
[facility] Pt oriented to room and call-light. Placed on telemetry and resting comfortably. RN will continue to monitor.

## 2014-06-05 NOTE — Evaluation (Signed)
Physical Therapy Evaluation Patient Details Name: Cathy RacerSusie Beatrice Rose MRN: [id number] DOB: 1924/06/10 Today's Date: 06/05/2014   History of Present Illness  Patient is a 81 y/o female admitted from SNF for confusion due to medications. PMH of HTN, CVA, dementia and TIA.  Clinical Impression  Patient presents with generalized weakness, balance deficits and baseline cognitive deficits impacting safe mobility. Pt reports hx of falls. Not able to obtain a clear PLOF/history secondary to hx of dementia. Would benefit from skilled PT and ST SNF to improve transfers, gait, balance and mobility so pt can ease burden of care, minimize fall risk and maximize independence.     Follow Up Recommendations SNF    Equipment Recommendations  None recommended by PT    Recommendations for Other Services       Precautions / Restrictions Precautions Precautions: Fall Restrictions Weight Bearing Restrictions: No      Mobility  Bed Mobility Overal bed mobility: Needs Assistance Bed Mobility: Supine to Sit;Sit to Supine     Supine to sit: Min guard;HOB elevated Sit to supine: Min guard;HOB elevated   General bed mobility comments: Use of rails for support. Min guard for safety. Increased time.   Transfers Overall transfer level: Needs assistance Equipment used: Rolling walker (2 wheeled) Transfers: Sit to/from Stand Sit to Stand: Min assist         General transfer comment: Min A to rise from EOB for balance. Stood from AllstateEOB x1, from toilet x1 using grab bar.  Ambulation/Gait Ambulation/Gait assistance: Min assist Ambulation Distance (Feet): 15 Feet (x2 bouts) Assistive device: Rolling walker (2 wheeled) Gait Pattern/deviations: Step-through pattern;Decreased stride length;Trunk flexed;Drifts right/left;Narrow base of support   Gait velocity interpretation: Below normal speed for age/gender General Gait Details: Pt with slow, unsteady gait. Cues for RW management and safety.    Stairs            Wheelchair Mobility    Modified Rankin (Stroke Patients Only)       Balance Overall balance assessment: Needs assistance;History of Falls Sitting-balance support: Feet supported;No upper extremity supported Sitting balance-Leahy Scale: Fair     Standing balance support: During functional activity Standing balance-Leahy Scale: Poor Standing balance comment: Relient on RW for support.                              Pertinent Vitals/Pain Pain Assessment: No/denies pain    Home Living Family/patient expects to be discharged to:: Skilled nursing facility                      Prior Function Level of Independence: Needs assistance   Gait / Transfers Assistance Needed: Reports using RW PTA.     Comments: Pt not able to provide PLOF/history secondary to dementia. Pt reports falls.     Hand Dominance        Extremity/Trunk Assessment   Upper Extremity Assessment: Defer to OT evaluation           Lower Extremity Assessment: Generalized weakness         Communication   Communication: No difficulties  Cognition Arousal/Alertness: Awake/alert Behavior During Therapy: WFL for tasks assessed/performed Overall Cognitive Status: No family/caregiver present to determine baseline cognitive functioning Area of Impairment: Orientation;Problem solving;Safety/judgement Orientation Level: Disoriented to;Place;Time;Situation       Safety/Judgement: Decreased awareness of safety;Decreased awareness of deficits   Problem Solving: Slow processing;Requires verbal cues      General Comments  Exercises        Assessment/Plan    PT Assessment Patient needs continued PT services  PT Diagnosis Difficulty walking;Generalized weakness   PT Problem List Decreased strength;Decreased cognition;Decreased activity tolerance;Decreased balance;Decreased mobility;Decreased safety awareness  PT Treatment Interventions Balance  training;Gait training;Patient/family education;Functional mobility training;Therapeutic activities;Therapeutic exercise;DME instruction   PT Goals (Current goals can be found in the Care Plan section) Acute Rehab PT Goals Patient Stated Goal: none stated PT Goal Formulation: Patient unable to participate in goal setting Time For Goal Achievement: 06/19/14 Potential to Achieve Goals: Fair    Frequency Min 2X/week   Barriers to discharge        Co-evaluation               End of Session Equipment Utilized During Treatment: Gait belt Activity Tolerance: Patient tolerated treatment well Patient left: in bed;with call bell/phone within reach;with bed alarm set Nurse Communication: Mobility status         Time: [id number] PT Time Calculation (min) (ACUTE ONLY): 15 min   Charges:   PT Evaluation $Initial PT Evaluation Tier I: 1 Procedure     PT G CodesAlvie Heidelberg:        Folan, Calub Tarnow A 06/05/2014, 3:18 PM Mylo RedShauna Dorian Duval, PT, DPT [phone]

## 2014-06-06 LAB — CBC
HCT: 29.5 % — ABNORMAL LOW (ref 36.0–46.0)
Hemoglobin: 9.9 g/dL — ABNORMAL LOW (ref 12.0–15.0)
MCH: 30.9 pg (ref 26.0–34.0)
MCHC: 33.6 g/dL (ref 30.0–36.0)
MCV: 92.2 fL (ref 78.0–100.0)
Platelets: 170 10*3/uL (ref 150–400)
RBC: 3.2 MIL/uL — ABNORMAL LOW (ref 3.87–5.11)
RDW: 14.5 % (ref 11.5–15.5)
WBC: 4.7 10*3/uL (ref 4.0–10.5)

## 2014-06-06 LAB — BASIC METABOLIC PANEL
Anion gap: 11 (ref 5–15)
BUN: 14 mg/dL (ref 6–20)
CO2: 24 mmol/L (ref 22–32)
Calcium: 8.8 mg/dL — ABNORMAL LOW (ref 8.9–10.3)
Chloride: 108 mmol/L (ref 101–111)
Creatinine, Ser: 1.07 mg/dL — ABNORMAL HIGH (ref 0.44–1.00)
GFR calc Af Amer: 53 mL/min — ABNORMAL LOW (ref >60)
GFR calc non Af Amer: 45 mL/min — ABNORMAL LOW (ref >60)
Glucose, Bld: 100 mg/dL — ABNORMAL HIGH (ref 70–99)
Potassium: 3 mmol/L — ABNORMAL LOW (ref 3.5–5.1)
Sodium: 143 mmol/L (ref 135–145)

## 2014-06-06 MED ORDER — ALUM & MAG HYDROXIDE-SIMETH 200-200-20 MG/5ML PO SUSP: 30.0000 mL | Freq: Four times a day (QID) | ORAL | Status: DC | PRN

## 2014-06-06 MED ORDER — MEMANTINE HCL ER 28 MG PO CP24
28.0000 mg | ORAL_CAPSULE | Freq: Every day | ORAL | Status: DC
  Filled 2014-06-06: qty 1

## 2014-06-06 MED ORDER — ONDANSETRON HCL 4 MG PO TABS: 4.0000 mg | ORAL_TABLET | Freq: Four times a day (QID) | ORAL | Status: DC | PRN

## 2014-06-06 MED ORDER — BENZTROPINE MESYLATE 1 MG PO TABS
1.0000 mg | ORAL_TABLET | Freq: Every day | ORAL | Status: DC
  Filled 2014-06-06: qty 1

## 2014-06-06 MED ORDER — DICLOFENAC SODIUM 1 % TD GEL: 1.0000 g | Freq: Two times a day (BID) | TRANSDERMAL | Status: DC | PRN

## 2014-06-06 NOTE — Clinical Social Work Note (Signed)
Clinical Social Work Assessment  Patient Details  Name: Cathy Rose MRN: [id number] Date of Birth: 02/27/1924  Date of referral:  06/06/14               Reason for consult:  Facility Placement, Discharge Planning                Permission sought to share information with:  Family Supports Permission granted to share information::  Yes, Verbal Permission Granted  Name::     Marjory Lies  Relationship::  Niece  Contact Information:  [phone]  Housing/Transportation Living arrangements for the past 2 months:  [place] of Information:  Patient, Other (Comment Required) (Niece) Patient Interpreter Needed:  None Criminal Activity/Legal Involvement Pertinent to Current Situation/Hospitalization:  No - Comment as needed Significant Relationships:  Other Family Members Lives with:  Facility Resident Do you feel safe going back to the place where you live?  Yes Need for family participation in patient care:  Yes (Comment)  Care giving concerns:  Clinical Social Worker spoke with patient niece over the phone who confirmed patient current living arrangement and wishes for her to return when ready.  Patient niece's only concern was transportation and requested that the facility provide transport at discharge - CSW to notify facility.   Social Worker assessment / plan:  Holiday representative met with patient at bedside and spoke with patient niece over the phone.  Patient and patient family both state that patient is from [facility] and would like to return at discharge.  Patient was able to articulate "the food is better."  Patient plans to return to [facility] today pending facility approval.  CSW remains available for support and to facilitate patient discharge needs.  Employment status:  Retired Forensic scientist:  Medicare PT Recommendations:  [address] / Referral to community resources:  Other  (Comment Required) (Assisted Living)  Patient/Family's Response to care:  Patient and patient niece verbalized agreement with return to [facility] and were appreciative of CSW involvement and support.  Patient/Family's Understanding of and Emotional Response to Diagnosis, Current Treatment, and Prognosis:  Patient confused at bedside, however patient niece realistic and understanding that patient may need a higher level of care in the future.  Patient family feel that [facility] will be handle to patient needs at this time.    Emotional Assessment Appearance:  Appears younger than stated age Attitude/Demeanor/Rapport:  Other (Calm and Confused) Affect (typically observed):  Accepting, Happy Orientation:  Oriented to Self Alcohol / Substance use:  Not Applicable Psych involvement (Current and /or in the community):  No (Comment)  Discharge Needs  Concerns to be addressed:  Discharge Planning Concerns Readmission within the last 30 days:  No Current discharge risk:  None Barriers to Discharge:  No Barriers Identified  [place]bette Or, [place]

## 2014-06-06 NOTE — Progress Notes (Signed)
Physical Therapy Treatment Patient Details Name: Cathy Rose MRN: [id number] DOB: 11/02/1924 Today's Date: 06/06/2014    History of Present Illness Patient is a 81 y/o female admitted from SNF for confusion due to medications. PMH of HTN, CVA, dementia and TIA.    PT Comments    Patient seen at request of MD due to actually from ALF.  Patient much improved with mobility requiring virtually no assist except for safety with walker (cues) and in tight space to maneuver safely with walker.  Feel safe to d/c back to ALF; HHPT would assist with walker training in her room for safety.  Follow Up Recommendations  Home health PT     Equipment Recommendations  None recommended by PT    Recommendations for Other Services       Precautions / Restrictions Precautions Precautions: Fall Restrictions Weight Bearing Restrictions: No    Mobility  Bed Mobility         Supine to sit: Modified independent (Device/Increase time);HOB elevated        Transfers   Equipment used: Rolling walker (2 wheeled) Transfers: Sit to/from Stand Sit to Stand: Modified independent (Device/Increase time)         General transfer comment: from bed and from toilet in bathroom; no loss of balance  Ambulation/Gait Ambulation/Gait assistance: Supervision;Min guard Ambulation Distance (Feet): 120 Feet Assistive device: Rolling walker (2 wheeled)       General Gait Details: performed well in open space of hallway (despite walker too tall for her,) cues for proximity to walker and assist in tight spaces in room to maneuver walker side stepping.   Stairs            Wheelchair Mobility    Modified Rankin (Stroke Patients Only)       Balance Overall balance assessment: Needs assistance         Standing balance support: No upper extremity supported Standing balance-Leahy Scale: Good Standing balance comment: in bathroom without UE support standing to perform perineal hygiene  without loss of balance (about 5 minutes,) and turning to flush toilet                     Cognition Arousal/Alertness: Awake/alert Behavior During Therapy: WFL for tasks assessed/performed Overall Cognitive Status: No family/caregiver present to determine baseline cognitive functioning                      Exercises      General Comments        Pertinent Vitals/Pain Pain Assessment: No/denies pain    Home Living                      Prior Function            PT Goals (current goals can now be found in the care plan section) Progress towards PT goals: Goals met and updated - see care plan    Frequency  Min 3X/week    PT Plan Discharge plan needs to be updated    Co-evaluation             End of Session Equipment Utilized During Treatment: Gait belt Activity Tolerance: Patient tolerated treatment well Patient left: in chair;with chair alarm set;with call bell/phone within reach     Time: 0958-1022 PT Time Calculation (min) (ACUTE ONLY): 24 min  Charges:  $Gait Training: 8-22 mins $Therapeutic Activity: 8-22 mins                      G Codes:      WYNN,CYNDI 06/06/2014, 10:52 AM  Magda Kiel, PT [phone] 06/06/2014

## 2014-06-06 NOTE — Progress Notes (Signed)
OT Cancellation Note  Patient Details Name: Gracy RacerSusie Beatrice Schmelzle MRN: [id number] DOB: Jun 23, 1924   Cancelled Treatment:    Reason Eval/Treat Not Completed: OT screened. Pt is from SNF and current D/C plan is to return to SNF. No apparent immediate acute care OT needs, therefore will defer OT to SNF. If OT eval is needed please call Acute Rehab Dept. at [phone] or text page OT at [phone].    Earlie RavelingStraub, Santiel Topper L OTR/L [phone] 06/06/2014, 8:29 AM

## 2014-06-06 NOTE — Clinical Social Work Note (Signed)
Clinical Social Worker facilitated patient discharge including contacting patient family and facility to confirm patient discharge plans.  Clinical information faxed to facility and family agreeable with plan.  [facility]  RN to call report prior to discharge.  Clinical Social Worker will sign off for now as social work intervention is no longer needed. Please consult us again if new need arises.  Macario GoldsJesse Rita Prom, [place]CS[place] [phone]

## 2014-06-06 NOTE — Discharge Instructions (Signed)
Your work up in the Emergency Department looked OK today.  CT of your head, chest xray looked Ok and did not show evidence of stroke or infection.  Urine did not show infection.  Labs all look good today.     Altered Mental Status Altered mental status most often refers to an abnormal change in your responsiveness and awareness. It can affect your speech, thought, mobility, memory, attention span, or alertness. It can range from slight confusion to complete unresponsiveness (coma). Altered mental status can be a sign of a serious underlying medical condition. Rapid evaluation and medical treatment is necessary for patients having an altered mental status. CAUSES   Low blood sugar (hypoglycemia) or diabetes.  Severe loss of body fluids (dehydration) or a body salt (electrolyte) imbalance.  A stroke or other neurologic problem, such as dementia or delirium.  A head injury or tumor.  A drug or alcohol overdose.  Exposure to toxins or poisons.  Depression, anxiety, and stress.  A low oxygen level (hypoxia).  An infection.  Blood loss.  Twitching or shaking (seizure).  Heart problems, such as heart attack or heart rhythm problems (arrhythmias).  A body temperature that is too low or too high (hypothermia or hyperthermia). DIAGNOSIS  A diagnosis is based on your history, symptoms, physical and neurologic examinations, and diagnostic tests. Diagnostic tests may include:  Measurement of your blood pressure, pulse, breathing, and oxygen levels (vital signs).  Blood tests.  Urine tests.  X-ray exams.  A computerized magnetic scan (magnetic resonance imaging, MRI).  A computerized X-ray scan (computed tomography, CT scan). TREATMENT  Treatment will depend on the cause. Treatment may include:  Management of an underlying medical or mental health condition.  Critical care or support in the hospital. HOME CARE INSTRUCTIONS   Only take over-the-counter or prescription medicines  for pain, discomfort, or fever as directed by your caregiver.  Manage underlying conditions as directed by your caregiver.  Eat a healthy, well-balanced diet to maintain strength.  Join a support group or prevention program to cope with the condition or trauma that caused the altered mental status. Ask your caregiver to help choose a program that works for you.  Follow up with your caregiver for further examination, therapy, or testing as directed. SEEK MEDICAL CARE IF:   You feel unwell or have chills.  You or your family notice a change in your behavior or your alertness.  You have trouble following your caregiver's treatment plan.  You have questions or concerns. SEEK IMMEDIATE MEDICAL CARE IF:   You have a rapid heartbeat or have chest pain.  You have difficulty breathing.  You have a fever.  You have a headache with a stiff neck.  You cough up blood.  You have blood in your urine or stool.  You have severe agitation or confusion. MAKE SURE YOU:   Understand these instructions.  Will watch your condition.  Will get help right away if you are not doing well or get worse. Document Released: 07/04/2009 Document Revised: 04/08/2011 Document Reviewed: 07/04/2009 [facility] Patient Information 2015 [employer] [place]. This information is not intended to replace advice given to you by your health care provider. Make sure you discuss any questions you have with your health care provider.

## 2014-06-06 NOTE — Discharge Summary (Signed)
Physician Discharge Summary  Cathy Rose ZOX:[id number] DOB: Aug 08, 1924 DOA: 06/04/2014  PCP: No primary care provider on file.  Admit date: 06/04/2014 Discharge date: 06/06/2014  Time spent: 40 minutes  Recommendations for Outpatient Follow-up:  Patient will be discharged to  Assisted-living facility, with home health services PT.  Patient will need to follow up with primary care provider within one week of discharge and discuss her medications.  Patient should continue medications as prescribed.  Patient should follow a  Heart healthy diet.   Discharge Diagnoses:  Metabolicencephalopathy Acute kidney injury on chronic kidney disease, stage III Dementia  History of CVA Hypertension Normocytic anemia  Discharge Condition: Stable  Diet recommendation: Heart healthy  Filed Weights   06/04/14 1229 06/05/14 0500 06/06/14 0610  Weight: 68.04 kg (150 lb) 64.3 kg (141 lb 12.1 oz) 60.4 kg (133 lb 2.5 oz)    History of present illness:  on 06/04/2014 by Dr. Tarry Kos 81 yo female sent in from SNF for confusion today, not acting normal. No report of illness. No fevers. No n/v/d. Pt is on narcotics at snf was given narcan no immediate response but now 15 minutes later is improving and responsive. No swelling. No complaints of pain. Pt alert now and much improved. Denies any pain. No sob.   Hospital Course:  Metabolic Encephalopathy  -Appears to baseline per daughter -possibly secondary to medications, as patient was given narcan and improved.  -Troponins negative -No leukocytosis, patient afebrile -UA and CXR negative for infection -CT and MRI negative for acute infarct -PT and OT consult, PT recommended home health -held patient's home medications including zoloft, cogentin, gabapentin, percocet.   Discussed with patient and her niece at bedside that these medications should be discussed with her primary care physician regarding resumption.  Acute Kidney Injury on  Chronic kidney Disease, stage III  -Creatinie improving, currently 1.07 (was 3.59 upon admission) -Unknown baseline creatinine, however in April 2016, was 1.44 -Continue to monitor BMP -Renal US: normal   Dementia -Appears at baseline -Namenda held  History of CVA -CT and MRI negative for acute infarcts -Continue plavix  Hypertension  -lisinopril and HCTZ held , continue upon discharge -BP currently stable  Normocytic Anemia -Appears baseline Hb 10, currently 9.9  -Currently hemodynamically stable, no evidence of bleeding   Procedures: None  Consultations: None  Discharge Exam: Filed Vitals:   06/06/14 0612  BP: 144/77  Pulse: 72  Temp: 98.5 F (36.9 C)  Resp: 20   Exam  General: Well developed, well nourished, NAD, appears stated age  HEENT: NCAT,  mucous membranes moist.   Cardiovascular: S1 S2 auscultated, RRR  Respiratory: Clear to auscultation   Abdomen: Soft, nontender, nondistended, + bowel sounds  Extremities: warm dry without cyanosis clubbing or edema  Neuro: AAOx3, nonfocal  Psych:  Appropriate mood and affect, pleasant  Discharge Instructions      Discharge Instructions    Discharge instructions    Complete by:  As directed   Patient will be discharged to  Assisted-living facility, with home health services PT.  Patient will need to follow up with primary care provider within one week of discharge and discuss her medications.  Patient should continue medications as prescribed.  Patient should follow a  Heart healthy diet.            Medication List    STOP taking these medications        benztropine 1 MG tablet  Commonly known as:  COGENTIN  cephALEXin 500 MG capsule  Commonly known as:  KEFLEX     gabapentin 100 MG capsule  Commonly known as:  NEURONTIN     hydrochlorothiazide 25 MG tablet  Commonly known as:  HYDRODIURIL     oxyCODONE-acetaminophen 5-325 MG per tablet  Commonly known as:  PERCOCET/ROXICET      sertraline 50 MG tablet  Commonly known as:  ZOLOFT      TAKE these medications        acetaminophen 325 MG tablet  Commonly known as:  TYLENOL  Take 650 mg by mouth every 6 (six) hours as needed for moderate pain (no more than 4gms of tylenol in 24 hours).     alum & mag hydroxide-simeth 200-200-20 MG/5ML suspension  Commonly known as:  MAALOX/MYLANTA  Take 30 mLs by mouth every 6 (six) hours as needed for indigestion or heartburn (dyspepsia).     CHLORASEPTIC 1.4 % Liqd  Generic drug:  phenol  Use as directed 2 sprays in the mouth or throat every 4 (four) hours as needed for throat irritation / pain (for 48 hours).     clopidogrel 75 MG tablet  Commonly known as:  PLAVIX  Take 75 mg by mouth daily.     diclofenac sodium 1 % Gel  Commonly known as:  VOLTAREN  Apply 2 g topically 2 (two) times daily as needed.     docusate sodium 100 MG capsule  Commonly known as:  COLACE  Take 200 mg by mouth 2 (two) times daily.     guaifenesin 100 MG/5ML syrup  Commonly known as:  ROBITUSSIN  Take 200 mg by mouth every 4 (four) hours as needed for cough.     hydrocortisone cream 1 %  Apply 1 application topically 2 (two) times daily as needed for itching (and apply to affected area bid until healed).     ibuprofen 600 MG tablet  Commonly known as:  ADVIL,MOTRIN  Take 600 mg by mouth 3 (three) times daily as needed for moderate pain.     lisinopril 40 MG tablet  Commonly known as:  PRINIVIL,ZESTRIL  Take 40 mg by mouth daily.     loratadine 10 MG tablet  Commonly known as:  CLARITIN  Take 10 mg by mouth daily.     memantine 28 MG Cp24 24 hr capsule  Commonly known as:  NAMENDA XR  Take 28 mg by mouth at bedtime.     NUTRITIONAL DRINK PO  Take 1 Can by mouth 2 (two) times daily between meals.     ondansetron 4 MG tablet  Commonly known as:  ZOFRAN  Take 1 tablet (4 mg total) by mouth every 6 (six) hours as needed for nausea or vomiting.     Vitamin D-3 1000 UNITS Caps    Take 1 capsule by mouth daily.       No Known Allergies Follow-up Information    Please follow up.   Why:  Follow up with your Physician at the Nursing home       The results of significant diagnostics from this hospitalization (including imaging, microbiology, ancillary and laboratory) are listed below for reference.    Significant Diagnostic Studies: Dg Chest 2 View  06/04/2014   CLINICAL DATA:  81 year old female with altered mental status, weakness and shaking.  EXAM: CHEST  2 VIEW  COMPARISON:  Prior acute abdominal series including chest x-ray 05/28/2014  FINDINGS: Low inspiratory volumes with bibasilar atelectasis. Borderline cardiomegaly. Atherosclerotic and slightly ectatic thoracic aorta. No  definite focal airspace consolidation, pleural effusion or pneumothorax. Central bronchitic changes appear similar compared to prior. No acute osseous abnormality.  IMPRESSION: 1. Stable chest x-ray without acute cardiopulmonary process. 2. Low inspiratory volumes with bibasilar atelectasis. 3. Borderline cardiomegaly and aortic atherosclerosis.   Electronically Signed   By: Malachy MoanHeath  McCullough M.D.   On: 06/04/2014 13:27   Ct Head Wo Contrast  06/04/2014   CLINICAL DATA:  Altered mental status and failure to thrive for 2 weeks, loss of appetite, history hypertension, dementia, stroke  EXAM: CT HEAD WITHOUT CONTRAST  TECHNIQUE: Contiguous axial images were obtained from the base of the skull through the vertex without intravenous contrast.  COMPARISON:  05/28/2014  FINDINGS: Generalized atrophy.  Normal ventricular morphology.  No midline shift or mass effect.  Small vessel chronic ischemic changes of deep cerebral white matter.  Normal appearance of brain parenchyma.  No intracranial hemorrhage, mass lesion, or evidence acute infarction.  No extra-axial fluid collections.  Visualized sinuses clear and bones unremarkable.  IMPRESSION: Atrophy with small vessel chronic ischemic changes of deep cerebral  white matter.  No acute intracranial abnormalities.   Electronically Signed   By: Ulyses SouthwardMark  Boles M.D.   On: 06/04/2014 14:18   Ct Head Wo Contrast  05/28/2014   CLINICAL DATA:  Emesis.  Dementia.  Fall 2 days ago.  EXAM: CT HEAD WITHOUT CONTRAST  TECHNIQUE: Contiguous axial images were obtained from the base of the skull through the vertex without intravenous contrast.  COMPARISON:  None.  FINDINGS: Sinuses/Soft tissues: Left sphenoid sinus mucous retention cyst or polyp. Clear mastoid air cells.  Intracranial: Cerebral atrophy is moderate. Mild low density in the periventricular white matter likely related to small vessel disease. No mass lesion, hemorrhage, hydrocephalus, acute infarct, intra-axial, or extra-axial fluid collection.  IMPRESSION: 1.  No acute intracranial abnormality. 2.  Cerebral atrophy and small vessel ischemic change.   Electronically Signed   By: Jeronimo GreavesKyle  Talbot M.D.   On: 05/28/2014 18:59   Mr Brain Wo Contrast  06/05/2014   CLINICAL DATA:  81 year old female with confusion onset yesterday. Failure to thrive for 2 weeks. Initial encounter.  EXAM: MRI HEAD WITHOUT CONTRAST  TECHNIQUE: Multiplanar, multiecho pulse sequences of the brain and surrounding structures were obtained without intravenous contrast.  COMPARISON:  Head CTs without contrast 06/04/2014 and earlier.  FINDINGS: Study is intermittently degraded by motion artifact despite repeated imaging attempts.  Major intracranial vascular flow voids are preserved, diminutive vertebrobasilar junction with evidence of fetal type PCA origins.  No restricted diffusion or evidence of acute infarction. No midline shift, mass effect, or evidence of intracranial mass lesion. No ventriculomegaly. No acute intracranial hemorrhage identified. Negative pituitary and cervicomedullary junction. Scattered and patchy cerebral white matter T2 and FLAIR hyperintensity. No cortical encephalomalacia or chronic blood products identified. Deep gray matter nuclei,  brainstem and cerebellum are within normal limits for age.  Visible internal auditory structures appear normal. Mastoids are clear. Trace paranasal sinus mucosal thickening. Postoperative changes to the globes. Visualized scalp soft tissues are within normal limits. Normal bone marrow signal. Multilevel degenerative spondylolisthesis in the visible cervical spine.  IMPRESSION: No acute intracranial abnormality identified.   Electronically Signed   By: Odessa FlemingH  Hall M.D.   On: 06/05/2014 07:51   [place] Renal  06/05/2014   CLINICAL DATA:  Renal failure.  EXAM: RENAL / URINARY TRACT ULTRASOUND COMPLETE  COMPARISON:  None.  FINDINGS: Right Kidney:  Length: 9.7 cm. Echogenicity within normal limits. No mass or hydronephrosis visualized.  Left Kidney:  Length: 10.7 cm. Echogenicity within normal limits. No mass or hydronephrosis visualized.  Bladder:  Appears normal for degree of bladder distention.  IMPRESSION: Normal examination.   Electronically Signed   By: Beckie Salts M.D.   On: 06/05/2014 08:25   Dg Abd Acute W/chest  05/28/2014   CLINICAL DATA:  Emesis.  Dementia.  EXAM: DG ABDOMEN ACUTE W/ 1V CHEST  COMPARISON:  None.  FINDINGS: Frontal view of the chest demonstrates chin overlies the apices, greater on the left. Normal heart size. No pleural effusion or pneumothorax. Low lung volumes with resultant pulmonary interstitial prominence. No lobar consolidation.  Abdominal films demonstrate No free intraperitoneal air or significant air-fluid levels on right-sided decubitus imaging. No bowel distension. Phleboliths in the left hemipelvis. Aortic atherosclerosis.  IMPRESSION: No acute process in the abdomen/pelvis.  Cardiomegaly and low lung volumes.   Electronically Signed   By: Jeronimo Greaves M.D.   On: 05/28/2014 19:02    Microbiology: Recent Results (from the past 240 hour(s))  MRSA PCR Screening     Status: None   Collection Time: 06/04/14  9:28 PM  Result Value Ref Range Status   MRSA by PCR NEGATIVE NEGATIVE  Final    Comment:        The GeneXpert MRSA Assay (FDA approved for NASAL specimens only), is one component of a comprehensive MRSA colonization surveillance program. It is not intended to diagnose MRSA infection nor to guide or monitor treatment for MRSA infections.      Labs: Basic Metabolic Panel:  Recent Labs Lab 06/04/14 1511 06/05/14 0340 06/06/14 0742  NA 141 145 143  K 4.4 3.9 3.0*  CL 106 112* 108  CO2 GLUCOSE 98 92 100*  BUN 31* 24* 14  CREATININE 3.59* 2.10* 1.07*  CALCIUM 8.9 8.3* 8.8*   Liver Function Tests:  Recent Labs Lab 06/04/14 1511  AST 25  ALT 11*  ALKPHOS 75  BILITOT 0.6  PROT 6.6  ALBUMIN 3.8   No results for input(s): LIPASE, AMYLASE in the last 168 hours. No results for input(s): AMMONIA in the last 168 hours. CBC:  Recent Labs Lab 06/04/14 1511 06/05/14 0340 06/06/14 0742  WBC 5.4 4.8 4.7  NEUTROABS 3.7  --   --   HGB 10.1* 9.2* 9.9*  HCT 31.1* 28.1* 29.5*  MCV 93.4 93.7 92.2  PLT 185 164 170   Cardiac Enzymes:  Recent Labs Lab 06/04/14 2230 06/05/14 0340 06/05/14 0940  TROPONINI <0.03 <0.03 <0.03   BNP: BNP (last 3 results) No results for input(s): BNP in the last 8760 hours.  ProBNP (last 3 results) No results for input(s): PROBNP in the last 8760 hours.  CBG: No results for input(s): GLUCAP in the last 168 hours.     SignedEdsel Petrin  [facility] 06/06/2014, 12:04 PM

## 2014-06-06 NOTE — Progress Notes (Signed)
Per physical therapy rec hhpt/ot, NCM gave orders to CSW to put in packet to go to ALF.

## 2014-06-06 NOTE — Progress Notes (Signed)
[facility] Currently [facility]

## 2015-10-03 ENCOUNTER — Telehealth: Payer: Self-pay | Admitting: *Deleted

## 2015-10-03 ENCOUNTER — Ambulatory Visit: Payer: Medicare Other | Admitting: Neurology

## 2015-10-03 NOTE — Telephone Encounter (Signed)
Nursing home canceled pt's 11:30 new patient appt at 10:30 because her niece could not attend the appt w/ the pt.

## 2015-10-04 ENCOUNTER — Encounter: Payer: Self-pay | Admitting: Neurology

## 2015-10-17 ENCOUNTER — Ambulatory Visit (INDEPENDENT_AMBULATORY_CARE_PROVIDER_SITE_OTHER): Payer: Medicare Other | Admitting: Neurology

## 2015-10-17 ENCOUNTER — Encounter: Payer: Self-pay | Admitting: Neurology

## 2015-10-17 VITALS — BP 158/84 | HR 70 | Ht 60.0 in | Wt 145.5 lb

## 2015-10-17 DIAGNOSIS — R0683 Snoring: Secondary | ICD-10-CM

## 2015-10-17 DIAGNOSIS — R51 Headache: Secondary | ICD-10-CM | POA: Diagnosis not present

## 2015-10-17 DIAGNOSIS — R519 Headache, unspecified: Secondary | ICD-10-CM

## 2015-10-17 MED ORDER — DONEPEZIL HCL 10 MG PO TABS: 10.0000 mg | ORAL_TABLET | Freq: Every day | ORAL | 3 refills | Status: DC

## 2015-10-17 MED ORDER — MEMANTINE HCL-DONEPEZIL HCL ER 28-10 MG PO CP24: 1.0000 | ORAL_CAPSULE | Freq: Every day | ORAL | 4 refills | Status: DC

## 2015-10-17 NOTE — Progress Notes (Signed)
PATIENT: Cathy Rose DOB: December 13, 1924  Chief Complaint  Patient presents with  . Headache    She wakes up each morning, around 9am, with a headache and nausea.  The headaches tends to resolve, with Tylenol 397m, about 11am.  Her headaches have been daily for 2-3 months.     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, who had a lot of medical illness, who just passed away in 203/01/16 she moved to [place] since 22016-03-01 she graduated from high school, retired from [employer], educationaljob[place]  Since 2Mar 02, 2014 she was noted to have memory loss, hallucinations, usually visual hallucinations, she saw people sitting at her house, no auditory hallucinations, previously she called police multiple times to check on those people, now she realizes they are not real, for a while, she has difficulty keeping up with her household payments, had her water cut off, now her niece become her power of attorney, she is taking Namenda XR 28 mg since 22016/03/01 she reported mild cognitive impairment, she sleeps well, eats well  Today she came in with a main complaint of morning headache since summer of 2017 every morning she woke up with bilateral frontal temporal pressure headaches, up to 8/10, lasting for couple hours, she has to take Tylenol almost on daily basis for that, she sleeps well, go to bed around 10:00, niece reported  that she snores a lot, had frequent choking episode observed, she does complain of dry most 1 waking up, tends to get sip of water when she woke few times in the middle of the night using bathroom,  I reviewed laboratory evaluation in 22017/03/02 creatinine 0.96, normal CBC, hemoglobin 12 point 6, normal CMP, vitamin B12 382, most recent was 1945, ferritin level was 13, TIBC is 329, iron level was 31, folic acid 7.2, vitamin D level was 20 3.4, previously low vitamin B12 134 in October 2016, she often misses her breakfast, her headache was much improved around 10 to 11:00  Have personally reviewed MRI of the brain in May 2016, moderate generalized atrophy, mild to moderate supratentorium small vessel disease.,  REVIEW OF SYSTEMS: Full 14 system review of systems performed and notable only for weight gain, fatigue, swelling legs, snoring, constipation, memory loss, confusion, headaches, weakness, snoring, depression, anxiety, decreased energy, disinteresting activities  ALLERGIES: No Known Allergies  HOME MEDICATIONS: Current Outpatient Prescriptions  Medication Sig Dispense Refill  . acetaminophen (TYLENOL) 325 MG tablet Take 650 mg by mouth every 6 (six) hours as needed for moderate pain (no more than 4gms of tylenol in 24 hours).     .Marland Kitchenalum & mag hydroxide-simeth (MAALOX/MYLANTA) 200-200-20 MG/5ML suspension Take 30 mLs by mouth every 6 (six) hours as needed for indigestion or heartburn (dyspepsia). 355 mL 0  . Cholecalciferol (VITAMIN D-3) 1000 UNITS CAPS Take 1 capsule by mouth daily.    . clopidogrel (PLAVIX) 75 MG tablet Take 75 mg by mouth daily.    .Marland Kitchen  diclofenac sodium (VOLTAREN) 1 % GEL Apply 2 g topically 2 (two) times daily as needed.    . docusate sodium (COLACE) 100 MG capsule Take 200 mg by mouth 2 (two) times daily.    Marland Kitchen guaifenesin (ROBITUSSIN) 100 MG/5ML syrup Take 200 mg by mouth every 4 (four) hours as needed for cough.    . hydrocortisone cream 1 % Apply 1 application topically 2  (two) times daily as needed for itching (and apply to affected area bid until healed).     Marland Kitchen ibuprofen (ADVIL,MOTRIN) 600 MG tablet Take 600 mg by mouth 3 (three) times daily as needed for moderate pain.    Marland Kitchen lisinopril (PRINIVIL,ZESTRIL) 40 MG tablet Take 40 mg by mouth daily.    Marland Kitchen loratadine (CLARITIN) 10 MG tablet Take 10 mg by mouth daily.    . memantine (NAMENDA XR) 28 MG CP24 24 hr capsule Take 28 mg by mouth at bedtime.    . Nutritional Supplements (NUTRITIONAL DRINK PO) Take 1 Can by mouth 2 (two) times daily between meals.    . ondansetron (ZOFRAN) 4 MG tablet Take 1 tablet (4 mg total) by mouth every 6 (six) hours as needed for nausea or vomiting. 12 tablet 0  . phenol (CHLORASEPTIC) 1.4 % LIQD Use as directed 2 sprays in the mouth or throat every 4 (four) hours as needed for throat irritation / pain (for 48 hours).     No current facility-administered medications for this visit.     PAST MEDICAL HISTORY: Past Medical History:  Diagnosis Date  . Alzheimer's disease   . Anxiety   . Dementia   . Depression   . GERD (gastroesophageal reflux disease)   . Hallucinations   . Headache   . Heart beat abnormality   . Hypertension   . Kidney disease   . Osteoarthritis   . Stroke ([facility])   . TIA (transient ischemic attack)   . Vitamin B deficiency   . Vitamin D deficiency     PAST SURGICAL HISTORY: History reviewed. No pertinent surgical history.  FAMILY HISTORY: Family History  Problem Relation Age of Onset  . Stroke Father     SOCIAL HISTORY:  Social History   Social History  . Marital status: Legally Separated    Spouse name: N/A  . Number of children: 0  . Years of education: HS   Occupational History  . Retired    Social History Main Topics  . Smoking status: Never Smoker  . Smokeless tobacco: Never Used  . Alcohol use No  . Drug use: No  . Sexual activity: Not on file   Other Topics Concern  . Not on file   Social History Narrative   Lives at  [facility].   Right-handed.   Occasional caffeine use.     PHYSICAL EXAM   Vitals:   10/17/15 1156  BP: (!) 158/84  Pulse: 70  Weight: 145 lb 8 oz (66 kg)  Height: 5' (1.524 m)    Not recorded      Body mass index is 28.42 kg/m.  PHYSICAL EXAMNIATION:  Gen: NAD, conversant, well nourised, obese, well groomed                     Cardiovascular: Regular rate rhythm, no peripheral edema, warm, nontender. Eyes: Conjunctivae clear without exudates or hemorrhage Neck: Supple, no carotid bruise. Pulmonary: Clear to auscultation bilaterally   NEUROLOGICAL EXAM:  MENTAL STATUS: Speech:    Speech is normal; fluent and spontaneous  with normal comprehension.  Cognition:     Orientation to time, place and person     Normal recent and remote memory     Normal Attention span and concentration     Normal Language, naming, repeating,spontaneous speech     Fund of knowledge   CRANIAL NERVES: CN II: Visual fields are full to confrontation. Fundoscopic exam is normal with sharp discs and no vascular changes. Pupils are round equal and briskly reactive to light. CN III, IV, VI: extraocular movement are normal. No ptosis. CN V: Facial sensation is intact to pinprick in all 3 divisions bilaterally. Corneal responses are intact.  CN VII: Face is symmetric with normal eye closure and smile. CN VIII: Hearing is normal to rubbing fingers CN IX, X: Palate elevates symmetrically. Phonation is normal. CN XI: Head turning and shoulder shrug are intact CN XII: Tongue is midline with normal movements and no atrophy.  MOTOR: There is no pronator drift of out-stretched arms. Muscle bulk and tone are normal. Muscle strength is normal.  REFLEXES: Reflexes are 2+ and symmetric at the biceps, triceps, knees, and ankles. Plantar responses are flexor.  SENSORY: Intact to light touch, pinprick, positional sensation and vibratory sensation are intact in fingers and toes.  COORDINATION: Rapid  alternating movements and fine finger movements are intact. There is no dysmetria on finger-to-nose and heel-knee-shin.    GAIT/STANCE: Kyphosis, mildly unsteady, cautious  DIAGNOSTIC DATA (LABS, IMAGING, TESTING) - I reviewed patient records, labs, notes, testing and imaging myself where available.   ASSESSMENT AND PLAN  Annakate Laurielle Selmon is a 82 y.o. female   New onset early-morning headache Reported snoring, choking episode, Possible Obstructive sleep apnea Laboratory evaluation ESR C-reactive protein Refer to sleep study  Mild cognitive impairment  Mini-Mental Status Examination 19/30 Change Namenda XR to Namzaric 28/10 daily  Marcial Pacas, M.D. Ph.D.  [facility] [address], [place], [place] [address] Ph: [phone] Fax: [phone]  CC: Cathy Laughter, MD

## 2015-10-18 ENCOUNTER — Encounter: Payer: Self-pay | Admitting: *Deleted

## 2015-10-18 ENCOUNTER — Telehealth: Payer: Self-pay | Admitting: Neurology

## 2015-10-18 LAB — SEDIMENTATION RATE: Sed Rate: 8 mm/hr (ref 0–40)

## 2015-10-18 LAB — C-REACTIVE PROTEIN: CRP: 2.9 mg/L (ref 0.0–4.9)

## 2015-10-18 NOTE — Telephone Encounter (Signed)
Cathy Rose called back to check status of message left earlier today, states no one has ever called her back, please call.

## 2015-10-18 NOTE — Telephone Encounter (Signed)
[place]oke to [place] - she is aware that the patient should only take Namzaric, per Dr. Terrace ArabiaYan.  Clarification orders faxed to her attention at [phone].

## 2015-10-18 NOTE — Telephone Encounter (Signed)
Alida/[facility] [phone] called regarding Memantine HCl-Donepezil HCl (NAMZARIC) 28-10 MG CP24 and donepezil (ARICEPT) 10 MG tablet, asks if Dr. Terrace ArabiaYan wants patient on both medications since they are both in the same drug class.

## 2015-10-19 ENCOUNTER — Institutional Professional Consult (permissible substitution): Payer: Medicare Other | Admitting: Neurology

## 2015-11-02 ENCOUNTER — Institutional Professional Consult (permissible substitution): Payer: Medicare Other | Admitting: Neurology

## 2015-11-08 ENCOUNTER — Encounter: Payer: Self-pay | Admitting: Neurology

## 2015-11-08 ENCOUNTER — Ambulatory Visit (INDEPENDENT_AMBULATORY_CARE_PROVIDER_SITE_OTHER): Payer: Medicare Other | Admitting: Neurology

## 2015-11-08 VITALS — BP 126/68 | HR 70 | Resp 14 | Ht 60.0 in | Wt 144.0 lb

## 2015-11-08 DIAGNOSIS — R351 Nocturia: Secondary | ICD-10-CM | POA: Diagnosis not present

## 2015-11-08 DIAGNOSIS — R400 Somnolence: Secondary | ICD-10-CM | POA: Diagnosis not present

## 2015-11-08 DIAGNOSIS — R51 Headache: Secondary | ICD-10-CM

## 2015-11-08 DIAGNOSIS — R0683 Snoring: Secondary | ICD-10-CM | POA: Diagnosis not present

## 2015-11-08 DIAGNOSIS — R519 Headache, unspecified: Secondary | ICD-10-CM

## 2015-11-08 NOTE — Patient Instructions (Addendum)

## 2015-11-08 NOTE — Progress Notes (Signed)
Subjective:    Patient ID: Cathy Rose is a 82 y.o. female.  HPI     Cathy FoleySaima Emaline Karnes, MD, PhD [facility] [address], [address] [address] [place], [place] [id number]  Dear Cathy EwingYijun,   I saw your patient, Cathy Rose, upon your kind request in my clinic today for initial consultation of her sleep disorder, in particular, concern for obstructive sleep apnea. The patient is accompanied, by Cathy Rose, her niece, today. As you know, Cathy Rose is an 82 year old right-handed woman with an underlying medical history of dementia associated with hallucinations, hypertension, chronic kidney disease, osteoarthritis, stroke, vitamin B and vitamin D deficiencies, TIA, depression, anxiety, reflux disease and recurrent headaches, who reports morning headaches, snoring and excessive daytime somnolence. I reviewed your office note from 10/17/2015. She reports feeling "lazy" for the past 2 or 3 months. She has lack of energy. She goes to bed around 9:30 and wake up time is around 9:30 but she drags in the mornings. She does not feel like doing anything. She has lost interest in going out with her niece who likes to take her out to the door to the mall. Her niece has noted lack of motivation, lack of interest, and wonders about depression. Patient currently does not appear to be on an antidepressant. She resides at [facility]. She does not have a family history of dementia. She has been on dementia medication, currently on Namzaric 28/10 mg. Her mother lived to be 80 years old, father lived to be 80 years old, one sister lived to be 80 years old and another sister lived to be 815 years old, Cathy' mother. She moved into assisted living last year. Of note, she lost her only son at the age of 80 from complications of bowel obstruction and internal hemorrhage. From what I understand he was a heavy drinker. Her Epworth sleepiness score is 3 out of 24, her fatigue score is  50 out of 63. She does not always drink enough water, she says that she forgets it. She does not drink alcohol or smoke. She occasionally wakes up with a headache. She has nocturia about twice per night on average. There is no family history of OSA as I understand.  Her Past Medical History Is Significant For: Past Medical History:  Diagnosis Date  . Alzheimer's disease   . Anxiety   . Dementia   . Depression   . GERD (gastroesophageal reflux disease)   . Hallucinations   . Headache   . Heart beat abnormality   . Hypertension   . Kidney disease   . Osteoarthritis   . Stroke ([facility])   . TIA (transient ischemic attack)   . Vitamin B deficiency   . Vitamin D deficiency     Her Past Surgical History Is Significant For: No past surgical history on file.  Her Family History Is Significant For: Family History  Problem Relation Age of Onset  . Stroke Father   . Colon cancer Sister   . Kidney disease Sister     Her Social History Is Significant For: Social History   Social History  . Marital status: Legally Separated    Spouse name: N/A  . Number of children: 0  . Years of education: HS   Occupational History  . Retired    Social History Main Topics  . Smoking status: Never Smoker  . Smokeless tobacco: Never Used  . Alcohol use No  . Drug use: No  .  Sexual activity: Not Asked   Other Topics Concern  . None   Social History Narrative   Lives at [facility].   Right-handed.   Occasional caffeine use.    Her Allergies Are:  No Known Allergies:   Her Current Medications Are:  Outpatient Encounter Prescriptions as of 11/08/2015  Medication Sig  . acetaminophen (TYLENOL) 325 MG tablet Take 650 mg by mouth every 6 (six) hours as needed for moderate pain (no more than 4gms of tylenol in 24 hours).   Marland Kitchen alum & mag hydroxide-simeth (MAALOX/MYLANTA) 200-200-20 MG/5ML suspension Take 30 mLs by mouth every 6 (six) hours as needed for indigestion or heartburn (dyspepsia).  .  Cholecalciferol (VITAMIN D-3) 1000 UNITS CAPS Take 1 capsule by mouth daily.  . clopidogrel (PLAVIX) 75 MG tablet Take 75 mg by mouth daily.  . diclofenac sodium (VOLTAREN) 1 % GEL Apply 2 g topically 2 (two) times daily as needed.  . docusate sodium (COLACE) 100 MG capsule Take 200 mg by mouth 2 (two) times daily.  Marland Kitchen guaifenesin (ROBITUSSIN) 100 MG/5ML syrup Take 200 mg by mouth every 4 (four) hours as needed for cough.  . hydrocortisone cream 1 % Apply 1 application topically 2 (two) times daily as needed for itching (and apply to affected area bid until healed).   Marland Kitchen ibuprofen (ADVIL,MOTRIN) 600 MG tablet Take 600 mg by mouth 3 (three) times daily as needed for moderate pain.  Marland Kitchen lisinopril (PRINIVIL,ZESTRIL) 40 MG tablet Take 40 mg by mouth daily.  Marland Kitchen loratadine (CLARITIN) 10 MG tablet Take 10 mg by mouth daily.  . Memantine HCl-Donepezil HCl (NAMZARIC) 28-10 MG CP24 Take 1 tablet by mouth daily.  . Nutritional Supplements (NUTRITIONAL DRINK PO) Take 1 Can by mouth 2 (two) times daily between meals.  . ondansetron (ZOFRAN) 4 MG tablet Take 1 tablet (4 mg total) by mouth every 6 (six) hours as needed for nausea or vomiting.  . phenol (CHLORASEPTIC) 1.4 % LIQD Use as directed 2 sprays in the mouth or throat every 4 (four) hours as needed for throat irritation / pain (for 48 hours).   No facility-administered encounter medications on file as of 11/08/2015.   :  Review of Systems:  Out of a complete 14 point review of systems, all are reviewed and negative with the exception of these symptoms as listed below:  Review of Systems  Constitutional: Positive for fatigue.  Neurological: Positive for headaches.       Some trouble falling asleep at night, wakes up a couple of times during the night, snoring, niece has noticed patient will make choking noises, wakes up feeling weak, morning headaches, some daytime fatigue, sometimes takes naps.   Psychiatric/Behavioral:       Decreased energy,  Depression and anxiety   Epworth Sleepiness Scale 0= would never doze 1= slight chance of dozing 2= moderate chance of dozing 3= high chance of dozing  Sitting and reading:0 Watching TV:0 Sitting inactive in a public place (ex. Theater or meeting):0 As a passenger in a car for an hour without a break:0 Lying down to rest in the afternoon:1 Sitting and talking to someone:1 Sitting quietly after lunch (no alcohol):1 In a car, while stopped in traffic:0 Total:3  Objective:  Neurologic Exam  Physical Exam Physical Examination:   Vitals:   11/08/15 1548  BP: 126/68  Pulse: 70  Resp: 14    General Examination: The patient is a very pleasant 82 y.o. female in no acute distress. She appears well-developed and well-nourished and  well groomed.   HEENT: Normocephalic, atraumatic, pupils are equal, round and reactive to light and accommodation. Funduscopic exam is normal with sharp disc margins noted. Extraocular tracking is good without limitation to gaze excursion or nystagmus noted. Normal smooth pursuit is noted. Hearing is grossly intact. Tympanic membranes are clear bilaterally. Face is symmetric with normal facial animation and normal facial sensation. Speech is clear with no dysarthria noted. There is no hypophonia. There is no lip, neck/head, jaw or voice tremor. Neck is supple with full range of passive and active motion. There are no carotid bruits on auscultation. Oropharynx exam reveals: mild mouth dryness, adequate dental hygiene and moderate airway crowding, due to small airway entry and larger appearing tongue. Soft palate is a little thicker appearing. Tonsils are in place but small. Neck circumference is 14-1/4 inches.  Chest: Clear to auscultation without wheezing, rhonchi or crackles noted.  Heart: S1+S2+0, regular and normal without murmurs, rubs or gallops noted.   Abdomen: Soft, non-tender and non-distended with normal bowel sounds appreciated on  auscultation.  Extremities: There is no pitting edema in the distal lower extremities bilaterally. Pedal pulses are intact.  Skin: Warm and dry without trophic changes noted. There are no varicose veins.  Musculoskeletal: exam reveals no obvious joint deformities, tenderness or joint swelling or erythema.   Neurologically:  Mental status: The patient is awake, alert and oriented in all 4 spheres. Her immediate and remote memory, attention, language skills and fund of knowledge are impaired. There is no evidence of aphasia, agnosia, apraxia or anomia. Speech is clear with normal prosody and enunciation. Thought process is linear. Mood is depressed and affect is constricted.  Cranial nerves II - XII are as described above under HEENT exam. In addition: shoulder shrug is normal with equal shoulder height noted. Motor exam: Normal bulk, strength and tone is noted. There is no drift, tremor or rebound. Romberg is negative. Reflexes are 1+ throughout. Fine motor skills and coordination: Mildly impaired globally.   Cerebellar testing: No dysmetria or intention tremor on finger to nose testing. Heel to shin is unremarkable bilaterally. There is no truncal or gait ataxia.  Sensory exam: intact to light touch in the upper and lower extremities.  Gait, station and balance: she stands with mild difficulty. Posture is mildly stooped. She walks slowly and cautiously and uses a single-point cane. Tandem walk is not possible.   Assessment and Plan:  In summary, Tiyana Chidera Dearcos is a very pleasant 82 y.o.-year old female with an underlying medical history of dementia associated with hallucinations, hypertension, chronic kidney disease, osteoarthritis, stroke, vitamin B and vitamin D deficiencies, TIA, depression, anxiety, reflux disease and recurrent headaches, whose history and physical exam are somewhat concerning for obstructive sleep apnea (OSA). I had a long chat with the patient and Cathy about my findings  and the diagnosis of OSA, its prognosis and treatment options. We talked about medical treatments, surgical interventions and non-pharmacological approaches. I explained in particular the risks and ramifications of untreated moderate to severe OSA, especially with respect to developing cardiovascular disease down the Road, including congestive heart failure, difficult to treat hypertension, cardiac arrhythmias, or stroke. Even type 2 diabetes has, in part, been linked to untreated OSA. Symptoms of untreated OSA include daytime sleepiness, memory problems, mood irritability and mood disorder such as depression and anxiety, lack of energy, as well as recurrent headaches, especially morning headaches. We talked about trying to maintain a healthy lifestyle in general, as well as the importance of weight  control. I encouraged the patient to eat healthy, exercise daily and keep well hydrated, to keep a scheduled bedtime and wake time routine, to not skip any meals and eat healthy snacks in between meals. I advised the patient not to drive when feeling sleepy. She does not drive. Of note, she is currently not on any antidepressant medication and it may be worthwhile looking into treating her depression.  I recommended the following at this time: sleep study with potential positive airway pressure titration. (We will score hypopneas at 4% and split the sleep study into diagnostic and treatment portion, if the estimated. 2 hour AHI is >15/h).   I explained the sleep test procedure to the patient and also outlined possible surgical and non-surgical treatment options of OSA, including the use of a custom-made dental device (which would require a referral to a specialist dentist or oral surgeon), upper airway surgical options, such as pillar implants, radiofrequency surgery, tongue base surgery, and UPPP (which would involve a referral to an ENT surgeon). Rarely, jaw surgery such as mandibular advancement may be considered.   I also explained the CPAP treatment option to the patient, who indicated that she would be willing to try CPAP if the need arises. I explained the importance of being compliant with PAP treatment, not only for insurance purposes but primarily to improve Her symptoms, and for the patient's long term health benefit, including to reduce Her cardiovascular risks. I answered all their questions today and the patient and her niece were in agreement. I would like to see her back after the sleep study is completed and encouraged her to call with any interim questions, concerns, problems or updates.   Thank you very much for allowing me to participate in the care of this nice patient. If I can be of any further assistance to you please do not hesitate to talk to me.  Sincerely,   Cathy Foley, MD, PhD

## 2015-11-14 ENCOUNTER — Ambulatory Visit (INDEPENDENT_AMBULATORY_CARE_PROVIDER_SITE_OTHER): Payer: Medicare Other | Admitting: Neurology

## 2015-11-14 DIAGNOSIS — G4720 Circadian rhythm sleep disorder, unspecified type: Secondary | ICD-10-CM

## 2015-11-14 DIAGNOSIS — G4733 Obstructive sleep apnea (adult) (pediatric): Secondary | ICD-10-CM

## 2015-11-14 DIAGNOSIS — G4761 Periodic limb movement disorder: Secondary | ICD-10-CM

## 2015-11-23 ENCOUNTER — Telehealth: Payer: Self-pay | Admitting: Neurology

## 2015-11-23 NOTE — Progress Notes (Signed)
PATIENT'S NAME:  Cathy Rose, Cathy Rose DOB:      05/28/1924      MR#:    [id number]     DATE OF RECORDING: 11/14/2015 REFERRING M.D.:  Levert FeinsteinYijun Yan, MD Study Performed:   Baseline Polysomnogram HISTORY:  82 year old right-handed woman with dementia associated with hallucinations, hypertension, chronic kidney disease, osteoarthritis, stroke, vitamin Rose and vitamin D deficiencies, TIA, depression, anxiety, reflux disease and recurrent headaches, who reports morning headaches, snoring and excessive daytime somnolence. The patient endorsed the Epworth Sleepiness Scale at 22/24 points and the Fatigue Score at  points. The patient's weight 213 pounds with a height of 61 (inches), resulting in a BMI of 40.4 kg/m2. The patient's neck circumference measured 14.5 inches.  CURRENT MEDICATIONS: Acetaminophen, Cholecalciferol, Clopidogrel, Diclofenac, Docusate, Guaifenesin, Hydrocortisone, Ibuprofen, Lisinopril, Loratadine, Memantine, Ondansetron and Ohenol   PROCEDURE:  This is a multichannel digital polysomnogram utilizing the Somnostar 11.2 system.  Electrodes and sensors were applied and monitored per AASM Specifications.   EEG, EOG, Chin and Limb EMG, were sampled at 200 Hz.  ECG, Snore and Nasal Pressure, Thermal Airflow, Respiratory Effort, CPAP Flow and Pressure, Oximetry was sampled at 50 Hz. Digital video and audio were recorded.      BASELINE STUDY  Lights Out was at 21:45 and Lights On at 05:19.  Total recording time (TRT) was 454.5 minutes, with a total sleep time (TST) of 18 minutes.   The patient's sleep latency was 364 minutes which is severely increased.  REM was absent. The sleep efficiency was 4%, which is severely reduced.     SLEEP ARCHITECTURE: WASO (Wake after sleep onset) was elevated. There were 2 minutes in Stage N1, 16 minutes Stage N2, 0 minutes Stage N3 and 0 minutes in Stage REM.  The percentage of Stage N1 was 11.1%, Stage N2 was 88.9%, absent Stage N3 and Stage R (REM sleep).   The video  and audio analysis did not show any abnormal or unusual behaviors, movements, or vocalizations.   Mild snoring was noted.   RESPIRATORY ANALYSIS:  There were a total of 4 respiratory events:  2 obstructive apneas, 0 central apneas and 0 mixed apneas with a total of 2 apneas and an apnea index (AI) of 6.7 /hour. There were 2 hypopneas with a hypopnea index of 6.7 /hour. The patient also had 0 respiratory event related arousals (RERAs).      The total APNEA/HYPOPNEA INDEX (AHI) was 13.3/hour and the total RESPIRATORY DISTURBANCE INDEX was 13.3 /hour.  0 events occurred in REM sleep and 4 events in NREM. The REM AHI was 0 /hour, versus a non-REM AHI of 13.3. The patient spent 18 minutes of total sleep time in the supine position and 0 minutes in non-supine.. The supine AHI was 13.4 versus a non-supine AHI of 0.0.  OXYGEN SATURATION & C02:  The Wake baseline 02 saturation was 95%, with the lowest being 93%. Time spent below 89% saturation equaled 0 minutes.   PERIODIC LIMB MOVEMENTS:   The patient had a total of 37 Periodic Limb Movements.  The Periodic Limb Movement (PLM) index was 123.3 and the PLM Arousal index was 6.7 /hour.   IMPRESSION:  1.  Dysfunctions associated with sleep stages or arousal from sleep 2.  Obstructive Sleep Apnea  3.  Periodic Limb Movement Disorder   RECOMMENDATIONS:  1. This study shows sleep fragmentation and abnormal sleep stage percentages with very poor sleep efficiency (patient stated, she did not know why she did not sleep, but felt good  in AM and ready to go); these are nonspecific findings and per se do not signify an intrinsic sleep disorder or a cause for the patient's sleep-related symptoms. Causes include (but are not limited to) the first night effect of the sleep study, circadian rhythm disturbances, medication effect or an underlying mood disorder or medical problem.  2. The patient should be cautioned not to drive, work at heights, or operate dangerous  or heavy equipment when tired or sleepy. Review and reiteration of good sleep hygiene measures should be pursued with any patient. 3. This study demonstrates overall mild obstructive sleep apnea, but with O2 saturations maintained in the 90s. The very little amount of sleep and absence of REM sleep likely underestimates her AHI and O2 nadir. A home sleep test may be more successful in detecting OSA.  4. Severe PLMs (periodic limb movements of sleep) were noted during the study with mild arousals; clinical correlation is recommended.    5. The patient will be seen in follow-up by Dr. Frances Furbish at [facility] for discussion of the test results and further management strategies. The referring provider will be notified of the test results.   I certify that I have reviewed the entire raw data recording prior to the issuance of this report in accordance with the Standards of Accreditation of the American Academy of Sleep Medicine (AASM)    Huston Foley, MD,PhD Diplomat, American Board of Psychiatry and Neurology  Diplomat, American Board of Sleep Medicine

## 2015-11-23 NOTE — Telephone Encounter (Addendum)
Patient referred by Dr. Terrace ArabiaYan, seen by me on 11/08/15, diagnostic PSG on 11/14/15.   Please call and notify the patient that the recent sleep study mainly showed that she slept only 18 (!) min for a 7 hour study. She does have PLMs and mild OSA. Please inform patient that I would like to go over the details of the study during a follow up appointment. Arrange a followup appointment. Also, route or fax report to PCP and referring MD, if other than PCP.  Once you have spoken to patient, you can close this encounter.   Thanks,  Huston FoleySaima Khary Schaben, MD, PhD [facility]

## 2015-11-23 NOTE — Telephone Encounter (Signed)
Spoke to caretaker [name], she is aware of results. I called [facility] and LM to schedule a f/u appt. Patient can take any available open slot.

## 2015-11-25 ENCOUNTER — Emergency Department (HOSPITAL_COMMUNITY)
Admission: EM | Admit: 2015-11-25 | Discharge: 2015-11-25 | Disposition: A | Discharge status: Home / Self Care | Payer: Medicare Other | Attending: Emergency Medicine | Admitting: Emergency Medicine

## 2015-11-25 ENCOUNTER — Emergency Department (HOSPITAL_COMMUNITY): Payer: Medicare Other

## 2015-11-25 ENCOUNTER — Encounter (HOSPITAL_COMMUNITY): Payer: Self-pay

## 2015-11-25 DIAGNOSIS — G309 Alzheimer's disease, unspecified: Secondary | ICD-10-CM | POA: Insufficient documentation

## 2015-11-25 DIAGNOSIS — Y9301 Activity, walking, marching and hiking: Secondary | ICD-10-CM | POA: Diagnosis not present

## 2015-11-25 DIAGNOSIS — S52502A Unspecified fracture of the lower end of left radius, initial encounter for closed fracture: Secondary | ICD-10-CM | POA: Diagnosis not present

## 2015-11-25 DIAGNOSIS — Y92129 Unspecified place in nursing home as the place of occurrence of the external cause: Secondary | ICD-10-CM | POA: Insufficient documentation

## 2015-11-25 DIAGNOSIS — Y999 Unspecified external cause status: Secondary | ICD-10-CM | POA: Diagnosis not present

## 2015-11-25 DIAGNOSIS — I10 Essential (primary) hypertension: Secondary | ICD-10-CM | POA: Diagnosis not present

## 2015-11-25 DIAGNOSIS — W19XXXA Unspecified fall, initial encounter: Secondary | ICD-10-CM

## 2015-11-25 DIAGNOSIS — W1839XA Other fall on same level, initial encounter: Secondary | ICD-10-CM | POA: Insufficient documentation

## 2015-11-25 DIAGNOSIS — Z8673 Personal history of transient ischemic attack (TIA), and cerebral infarction without residual deficits: Secondary | ICD-10-CM | POA: Diagnosis not present

## 2015-11-25 DIAGNOSIS — S6992XA Unspecified injury of left wrist, hand and finger(s), initial encounter: Secondary | ICD-10-CM | POA: Diagnosis present

## 2015-11-25 LAB — COMPREHENSIVE METABOLIC PANEL
ALT: 10 U/L — ABNORMAL LOW (ref 14–54)
AST: 16 U/L (ref 15–41)
Albumin: 4.1 g/dL (ref 3.5–5.0)
Alkaline Phosphatase: 76 U/L (ref 38–126)
Anion gap: 7 (ref 5–15)
BUN: 18 mg/dL (ref 6–20)
CO2: 26 mmol/L (ref 22–32)
Calcium: 9.4 mg/dL (ref 8.9–10.3)
Chloride: 110 mmol/L (ref 101–111)
Creatinine, Ser: 1.24 mg/dL — ABNORMAL HIGH (ref 0.44–1.00)
GFR calc Af Amer: 43 mL/min — ABNORMAL LOW (ref >60)
GFR calc non Af Amer: 37 mL/min — ABNORMAL LOW (ref >60)
Glucose, Bld: 100 mg/dL — ABNORMAL HIGH (ref 65–99)
Potassium: 4.7 mmol/L (ref 3.5–5.1)
Sodium: 143 mmol/L (ref 135–145)
Total Bilirubin: 0.4 mg/dL (ref 0.3–1.2)
Total Protein: 6.6 g/dL (ref 6.5–8.1)

## 2015-11-25 LAB — URINALYSIS, ROUTINE W REFLEX MICROSCOPIC
Bilirubin Urine: NEGATIVE
Glucose, UA: NEGATIVE mg/dL
Hgb urine dipstick: NEGATIVE
Ketones, ur: NEGATIVE mg/dL
Leukocytes, UA: NEGATIVE
Nitrite: NEGATIVE
Protein, ur: NEGATIVE mg/dL
Specific Gravity, Urine: 1.014 (ref 1.005–1.030)
pH: 6.5 (ref 5.0–8.0)

## 2015-11-25 LAB — CBC WITH DIFFERENTIAL/PLATELET
Basophils Absolute: 0 10*3/uL (ref 0.0–0.1)
Basophils Relative: 1 %
Eosinophils Absolute: 0.1 10*3/uL (ref 0.0–0.7)
Eosinophils Relative: 2 %
HCT: 36 % (ref 36.0–46.0)
Hemoglobin: 11.9 g/dL — ABNORMAL LOW (ref 12.0–15.0)
Lymphocytes Relative: 13 %
Lymphs Abs: 0.8 10*3/uL (ref 0.7–4.0)
MCH: 32.4 pg (ref 26.0–34.0)
MCHC: 33.1 g/dL (ref 30.0–36.0)
MCV: 98.1 fL (ref 78.0–100.0)
Monocytes Absolute: 0.5 10*3/uL (ref 0.1–1.0)
Monocytes Relative: 8 %
Neutro Abs: 4.5 10*3/uL (ref 1.7–7.7)
Neutrophils Relative %: 76 %
Platelets: 194 10*3/uL (ref 150–400)
RBC: 3.67 MIL/uL — ABNORMAL LOW (ref 3.87–5.11)
RDW: 13.1 % (ref 11.5–15.5)
WBC: 6 10*3/uL (ref 4.0–10.5)

## 2015-11-25 LAB — I-STAT TROPONIN, ED: Troponin i, poc: 0 ng/mL (ref 0.00–0.08)

## 2015-11-25 MED ORDER — ACETAMINOPHEN 325 MG PO TABS
650.0000 mg | ORAL_TABLET | Freq: Once | ORAL | Status: AC
  Administered 2015-11-25: 650 mg via ORAL
  Filled 2015-11-25: qty 2

## 2015-11-25 NOTE — ED Notes (Signed)
Pt. Was moved to E 38 from the Hallway, placed on monitor and vitals ,  Found money in her bra wrapped up in tissue.  Money counted with security, pt. And this RN and placed in the safe in the lock box in the Security office

## 2015-11-25 NOTE — Discharge Instructions (Signed)
Follow up with Orthopedics for forearm fracture

## 2015-11-25 NOTE — ED Provider Notes (Signed)
[facility]-EMERGENCY DEPT Provider Note   CSN: [id number] Arrival date & time: 11/25/15  1537     History   Chief Complaint Chief Complaint  Patient presents with  . Fall  . Hand Injury    HPI Cathy Rose is a 82 y.o. female who presents with a fall. Past medical history significant for mild dementia, GERD, headache, hypertension, arthritis, history of stroke and TIA. She states that her legs gave out on her all walking at the nursing home today. She fell on to her side and hurt her left wrist. Denies syncope or head injury. Denies fever, chills, headache, neck pain, back pain, vision changes, chest pain, SOB, abdominal pain, N/V, or pain in any other extremity. She endorses overall generalized weakness and fatigue over the past several months but is unable to elaborate much more. Spoke with her niece [name] who states that the patient has complained about this to her as well. Her PCP sent her for a sleep study which showed she was not sleeping well and wanted to put her on CPAP but patient refused.  HPI  Past Medical History:  Diagnosis Date  . Alzheimer's disease   . Anxiety   . Dementia   . Depression   . GERD (gastroesophageal reflux disease)   . Hallucinations   . Headache   . Heart beat abnormality   . Hypertension   . Kidney disease   . Osteoarthritis   . Stroke (HCC)   . TIA (transient ischemic attack)   . Vitamin B deficiency   . Vitamin D deficiency     Patient Active Problem List   Diagnosis Date Noted  . Headache 06/04/2014  . Normochromic anemia 06/04/2014  . H/O: CVA (cerebrovascular accident) 06/04/2014  . Dementia     History reviewed. No pertinent surgical history.  OB History    No data available       Home Medications    Prior to Admission medications   Medication Sig Start Date End Date Taking? Authorizing Provider  acetaminophen (TYLENOL) 325 MG tablet Take 650 mg by mouth every 6 (six) hours as needed for moderate pain (no  more than 4gms of tylenol in 24 hours).     Historical Provider, MD  alum & mag hydroxide-simeth (MAALOX/MYLANTA) 200-200-20 MG/5ML suspension Take 30 mLs by mouth every 6 (six) hours as needed for indigestion or heartburn (dyspepsia). 06/06/14   Maryann Mikhail, DO  Cholecalciferol (VITAMIN D-3) 1000 UNITS CAPS Take 1 capsule by mouth daily.    Historical Provider, MD  clopidogrel (PLAVIX) 75 MG tablet Take 75 mg by mouth daily.    Historical Provider, MD  diclofenac sodium (VOLTAREN) 1 % GEL Apply 2 g topically 2 (two) times daily as needed. 06/06/14   Maryann Mikhail, DO  docusate sodium (COLACE) 100 MG capsule Take 200 mg by mouth 2 (two) times daily.    Historical Provider, MD  guaifenesin (ROBITUSSIN) 100 MG/5ML syrup Take 200 mg by mouth every 4 (four) hours as needed for cough.    Historical Provider, MD  hydrocortisone cream 1 % Apply 1 application topically 2 (two) times daily as needed for itching (and apply to affected area bid until healed).     Historical Provider, MD  ibuprofen (ADVIL,MOTRIN) 600 MG tablet Take 600 mg by mouth 3 (three) times daily as needed for moderate pain.    Historical Provider, MD  lisinopril (PRINIVIL,ZESTRIL) 40 MG tablet Take 40 mg by mouth daily.    Historical Provider, MD  loratadine (  CLARITIN) 10 MG tablet Take 10 mg by mouth daily.    Historical Provider, MD  Memantine HCl-Donepezil HCl (NAMZARIC) 28-10 MG CP24 Take 1 tablet by mouth daily. 10/17/15   Levert Feinstein, MD  Nutritional Supplements (NUTRITIONAL DRINK PO) Take 1 Can by mouth 2 (two) times daily between meals.    Historical Provider, MD  ondansetron (ZOFRAN) 4 MG tablet Take 1 tablet (4 mg total) by mouth every 6 (six) hours as needed for nausea or vomiting. 06/06/14   Maryann Mikhail, DO  phenol (CHLORASEPTIC) 1.4 % LIQD Use as directed 2 sprays in the mouth or throat every 4 (four) hours as needed for throat irritation / pain (for 48 hours).    Historical Provider, MD    Family History Family History    Problem Relation Age of Onset  . Stroke Father   . Colon cancer Sister   . Kidney disease Sister     Social History Social History  Substance Use Topics  . Smoking status: Never Smoker  . Smokeless tobacco: Never Used  . Alcohol use No     Allergies   Review of patient's allergies indicates no known allergies.   Review of Systems Review of Systems  Constitutional: Positive for fatigue. Negative for chills and fever.  Eyes: Negative for visual disturbance.  Respiratory: Negative for shortness of breath.   Cardiovascular: Negative for chest pain.  Gastrointestinal: Negative for abdominal pain, nausea and vomiting.  Genitourinary: Negative for dysuria.  Musculoskeletal: Positive for arthralgias. Negative for back pain.  Neurological: Positive for weakness. Negative for dizziness, syncope, light-headedness, numbness and headaches.  All other systems reviewed and are negative.    Physical Exam Updated Vital Signs BP 146/69 (BP Location: Right Arm)   Pulse 76   Temp 98.1 F (36.7 C) (Oral)   Resp 18   SpO2 100%   Physical Exam  Constitutional: She is oriented to person, place, and time. She appears well-developed and well-nourished. No distress.  Calm, cooperative, pleasant, NAD  HENT:  Head: Normocephalic and atraumatic.  Eyes: Conjunctivae are normal. Pupils are equal, round, and reactive to light. Right eye exhibits no discharge. Left eye exhibits no discharge. No scleral icterus.  Neck: Normal range of motion. Neck supple.  Cardiovascular: Normal rate and regular rhythm.  Exam reveals no gallop and no friction rub.   No murmur heard. Pulmonary/Chest: Effort normal and breath sounds normal. No respiratory distress. She has no wheezes. She has no rales. She exhibits no tenderness.  Abdominal: Soft. Bowel sounds are normal. She exhibits no distension. There is no tenderness.  Musculoskeletal: She exhibits no edema.  Left wrist: Obvious swelling and deformity to  wrist with tenderness to palpation. Decreased ROM. N/V intact.   Neurological: She is alert and oriented to person, place, and time.  Mental Status:  Alert, oriented, thought content appropriate, able to give a coherent history. Speech fluent without evidence of aphasia. Able to follow 2 step commands without difficulty.  Cranial Nerves:  II:  Peripheral visual fields grossly normal, pupils equal, round, reactive to light III,IV, VI: ptosis not present, extra-ocular motions intact bilaterally  V,VII: smile symmetric, facial light touch sensation equal VIII: hearing grossly normal to voice  X: uvula elevates symmetrically  XI: bilateral shoulder shrug symmetric and strong XII: midline tongue extension without fassiculations Motor:  Normal tone. 5/5 in upper and lower extremities bilaterally including strong and equal grip strength and dorsiflexion/plantar flexion Sensory: Pinprick and light touch normal in all extremities.  Cerebellar: normal finger-to-nose  with bilateral upper extremities Gait: normal gait and balance CV: distal pulses palpable throughout    Skin: Skin is warm and dry.  Psychiatric: She has a normal mood and affect. Her behavior is normal.  Nursing note and vitals reviewed.    ED Treatments / Results  Labs (all labs ordered are listed, but only abnormal results are displayed) Labs Reviewed  CBC WITH DIFFERENTIAL/PLATELET - Abnormal; Notable for the following:       Result Value   RBC 3.67 (*)    Hemoglobin 11.9 (*)    All other components within normal limits  COMPREHENSIVE METABOLIC PANEL - Abnormal; Notable for the following:    Glucose, Bld 100 (*)    Creatinine, Ser 1.24 (*)    ALT 10 (*)    GFR calc non Af Amer 37 (*)    GFR calc Af Amer 43 (*)    All other components within normal limits  URINALYSIS, ROUTINE W REFLEX MICROSCOPIC (NOT AT [facility])  I-STAT TROPOININ, ED    EKG  EKG Interpretation None       Radiology Ct Head Wo Contrast  Result  Date: 11/25/2015 CLINICAL DATA:  Patient fell this morning. EXAM: CT HEAD WITHOUT CONTRAST CT CERVICAL SPINE WITHOUT CONTRAST TECHNIQUE: Multidetector CT imaging of the head and cervical spine was performed following the standard protocol without intravenous contrast. Multiplanar CT image reconstructions of the cervical spine were also generated. COMPARISON:  Jun 04, 2014 FINDINGS: CT HEAD FINDINGS Brain: No subdural, epidural, or subarachnoid hemorrhage. Ventricles and sulci are stable. Cerebellum, brainstem, and basal cisterns are unremarkable. Scattered white matter changes. No acute cortical ischemia or infarct. No mass, mass effect, or midline shift. Vascular: Atherosclerotic changes are seen in the intracranial carotid arteries. Skull: Normal. Negative for fracture or focal lesion. Sinuses/Orbits: There is opacification of the left sphenoid sinus. Paranasal sinuses, mastoid air cells, and middle ears are otherwise unremarkable. Other: No other abnormalities. CT CERVICAL SPINE FINDINGS Alignment: There is 3.6 mm of anterolisthesis of C4 versus C5. No other malalignment. No fractures identified. Skull base and vertebrae: No acute fracture. No primary bone lesion or focal pathologic process. Soft tissues and spinal canal: No prevertebral fluid or swelling. No visible canal hematoma. Disc levels:  Multilevel degenerative changes. Upper chest: Negative. Other: No other abnormalities. IMPRESSION: 1. No acute intracranial abnormality. 2. 3.6 mm of anterolisthesis of C4 versus C5, thought to be degenerative due to facet changes, with no evidence of associated fracture. No fracture or traumatic malalignment seen in the cervical spine. Electronically Signed   By: Gerome Sam III M.D   On: 11/25/2015 19:27   Ct Cervical Spine Wo Contrast  Result Date: 11/25/2015 CLINICAL DATA:  Patient fell this morning. EXAM: CT HEAD WITHOUT CONTRAST CT CERVICAL SPINE WITHOUT CONTRAST TECHNIQUE: Multidetector CT imaging of the  head and cervical spine was performed following the standard protocol without intravenous contrast. Multiplanar CT image reconstructions of the cervical spine were also generated. COMPARISON:  Jun 04, 2014 FINDINGS: CT HEAD FINDINGS Brain: No subdural, epidural, or subarachnoid hemorrhage. Ventricles and sulci are stable. Cerebellum, brainstem, and basal cisterns are unremarkable. Scattered white matter changes. No acute cortical ischemia or infarct. No mass, mass effect, or midline shift. Vascular: Atherosclerotic changes are seen in the intracranial carotid arteries. Skull: Normal. Negative for fracture or focal lesion. Sinuses/Orbits: There is opacification of the left sphenoid sinus. Paranasal sinuses, mastoid air cells, and middle ears are otherwise unremarkable. Other: No other abnormalities. CT CERVICAL SPINE FINDINGS Alignment: There  is 3.6 mm of anterolisthesis of C4 versus C5. No other malalignment. No fractures identified. Skull base and vertebrae: No acute fracture. No primary bone lesion or focal pathologic process. Soft tissues and spinal canal: No prevertebral fluid or swelling. No visible canal hematoma. Disc levels:  Multilevel degenerative changes. Upper chest: Negative. Other: No other abnormalities. IMPRESSION: 1. No acute intracranial abnormality. 2. 3.6 mm of anterolisthesis of C4 versus C5, thought to be degenerative due to facet changes, with no evidence of associated fracture. No fracture or traumatic malalignment seen in the cervical spine. Electronically Signed   By: Gerome Samavid  Williams III M.D   On: 11/25/2015 19:27   Dg Hand Complete Left  Result Date: 11/25/2015 CLINICAL DATA:  Fall today.  Pain. EXAM: LEFT HAND - COMPLETE 3+ VIEW COMPARISON:  None. FINDINGS: Degenerative change in the radiocarpal joint with joint space narrowing and prominent spurring of the distal radius and radial styloid. On the lateral view, there is a fracture through the distal radius involving the dorsal  osteophyte. Mild displacement. No fracture of the carpal bones. IMPRESSION: Extensive radiocarpal degenerative change. Fracture of a dorsal osteophyte of the distal radius. Electronically Signed   By: Marlan Palauharles  Clark M.D.   On: 11/25/2015 17:07    Procedures Procedures (including critical care time)  Medications Ordered in ED Medications  acetaminophen (TYLENOL) tablet 650 mg (650 mg Oral Given 11/25/15 1952)     Initial Impression / Assessment and Plan / ED Course  I have reviewed the triage vital signs and the nursing notes.  Pertinent labs & imaging results that were available during my care of the patient were reviewed by me and considered in my medical decision making (see chart for details).  Clinical Course   82 year old female presents after a mechanical fall and left distal radial fracture. Patient is afebrile, not tachycardic or tachypneic, normotensive, and not hypoxic. CBC remarkable for mild anemia which is better than when last checked. CMP remarkable for mild increase in SCr from 1.07 in 05/2014 to 1.24 today. EKG is SR. Troponin is 0. CT head and neck are negative. UA is clean.   Spoke with niece and updated her on status of patient and advised Ortho follow up. Pain treated in ED and patient placed in velcro brace. Opportunity for questions provided and all questions answered. Return precautions given.   Final Clinical Impressions(s) / ED Diagnoses   Final diagnoses:  Fall, initial encounter  Closed fracture of distal end of left radius, unspecified fracture morphology, initial encounter    New Prescriptions New Prescriptions   No medications on file     Bethel BornKelly Marie Edlin Ford, PA-C 11/26/15 1933    Gerhard Munchobert Lockwood, MD 11/26/15 [phone]

## 2015-11-25 NOTE — ED Notes (Signed)
[facility] contacted to transport patient to [facility] 

## 2015-11-25 NOTE — ED Notes (Signed)
[facility]

## 2015-11-25 NOTE — ED Triage Notes (Signed)
[facility]  Onset this morning pt in a common area and fell backwards.  No LOC.  Pt reports "I felt weak, I have been weak in the mornings for several months".  Knot to top of left hand that is painful.  No c/o cervical, thoracic, or lumbar pain.  Towel around neck by EMS.

## 2016-01-16 ENCOUNTER — Ambulatory Visit (INDEPENDENT_AMBULATORY_CARE_PROVIDER_SITE_OTHER): Payer: Medicare Other | Admitting: Neurology

## 2016-01-16 ENCOUNTER — Encounter: Payer: Self-pay | Admitting: Neurology

## 2016-01-16 VITALS — BP 138/80 | HR 64 | Ht 60.0 in | Wt 141.0 lb

## 2016-01-16 DIAGNOSIS — R269 Unspecified abnormalities of gait and mobility: Secondary | ICD-10-CM | POA: Diagnosis not present

## 2016-01-16 DIAGNOSIS — F0390 Unspecified dementia without behavioral disturbance: Secondary | ICD-10-CM

## 2016-01-16 NOTE — Progress Notes (Signed)
Subjective:    Patient ID: Cathy RacerSusie Beatrice Rose is a 82 y.o. female.  HPI   Review of Systems  Constitutional: Positive for fatigue.  Neurological: Positive for headaches.       Some trouble falling asleep at night, wakes up a couple of times during the night, snoring, niece has noticed patient will make choking noises, wakes up feeling weak, morning headaches, some daytime fatigue, sometimes takes naps.   Psychiatric/Behavioral:       Decreased energy, Depression and anxiety   Epworth Sleepiness Scale 0= would never doze 1= slight chance of dozing 2= moderate chance of dozing 3= high chance of dozing    Objective:  Neurologic Exam  Physical Exam Physical Examination:

## 2016-01-16 NOTE — Progress Notes (Signed)
PATIENT: Cathy RacerSusie Beatrice Rose DOB: 02-24-1924  Chief Complaint  Patient presents with  . Headache    She resides [facility] and is here alone today.  Reports headaches have improved.  . Memory Loss    MMSE 20/30 - 8 animals. Says she has good and bad days with her memory but overall stable.  . Daytime Somnolence    She has a sleep study pending for scheduling.     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, who had a lot of medical illness, who just passed away in 2016, she moved to [facility] assistance living since 2016, she graduated from high school, retired from [employer] job, moved [place] in her sixties  Since 2014, she was noted to have memory loss, hallucinations, usually visual hallucinations, she saw people sitting at her house, no auditory hallucinations, previously she called police multiple times to check on those people, now she realizes they are not real, for a while, she has difficulty keeping up with her household payments, had her water cut off, now her niece become her power of attorney, she is taking Namenda XR 28 mg since 2016, she reported mild cognitive impairment, she sleeps well, eats well  Today she came in with a main complaint of morning headache since summer of 2017 every morning she woke up with bilateral frontal temporal pressure headaches, up to 8/10, lasting for couple hours, she has to take Tylenol  almost on daily basis for that, she sleeps well, go to bed around 10:00, niece reported that she snores a lot, had frequent choking episode observed, she does complain of dry most 1 waking up, tends to get sip of water when she woke few times in the middle of the night using bathroom,  I reviewed laboratory evaluation in 2017, creatinine 0.96, normal CBC, hemoglobin 12 point 6, normal CMP, vitamin B12 382, most recent was 1945, ferritin level was 13, TIBC is 329, iron level was 31, folic acid 7.2, vitamin D level was 20 3.4, previously low vitamin B12 134 in October 2016, she often misses her breakfast, her headache was much improved around 10 to 11:00  Have personally reviewed MRI of the brain in May 2016, moderate generalized atrophy, mild to moderate supratentorium small vessel disease.,  UPDATE Dec 19th 2017: She is alone at visit, dropped off [facility], she denies significant pain, no headaches, mild memory loss, MMSE 20/30, she has good appetite, sleeps well.   REVIEW OF SYSTEMS: Full 14 system review of systems performed and notable only for weight gain, fatigue, swelling legs, snoring, constipation, memory loss, confusion, headaches, weakness, snoring, depression, anxiety, decreased energy, disinteresting activities  ALLERGIES: No Known Allergies  HOME MEDICATIONS: Current Outpatient Prescriptions  Medication Sig Dispense Refill  . acetaminophen (TYLENOL) 325 MG tablet Take 650 mg by mouth every 6 (six) hours as needed for moderate pain (no more than 4gms of tylenol in 24 hours).     Marland Kitchen. amLODipine (NORVASC) 10  MG tablet Take 10 mg by mouth daily.    . celecoxib (CELEBREX) 200 MG capsule Take 200 mg by mouth daily.    . Cholecalciferol (VITAMIN D-3) 1000 UNITS CAPS Take 1 capsule by mouth daily.    . citalopram (CELEXA) 40 MG tablet Take 40 mg by mouth daily.    . clopidogrel (PLAVIX) 75 MG tablet Take 75 mg by mouth daily.    . diclofenac sodium (VOLTAREN) 1 % GEL Apply  2 g topically 2 (two) times daily as needed.    . docusate sodium (COLACE) 100 MG capsule Take 200 mg by mouth 2 (two) times daily.    . hydrALAZINE (APRESOLINE) 25 MG tablet Take 25 mg by mouth 2 (two) times daily.    . hydrocortisone cream 1 % Apply 1 application topically 2 (two) times daily as needed for itching (and apply to affected area bid until healed).     Marland Kitchen. levocetirizine (XYZAL) 5 MG tablet Take 5 mg by mouth every evening.    Marland Kitchen. lisinopril (PRINIVIL,ZESTRIL) 40 MG tablet Take 40 mg by mouth daily.    . magnesium hydroxide (MILK OF MAGNESIA) 400 MG/5ML suspension Take by mouth daily as needed for mild constipation.    . Memantine HCl-Donepezil HCl (NAMZARIC) 28-10 MG CP24 Take 1 tablet by mouth daily. 90 capsule 4  . mometasone (NASONEX) 50 MCG/ACT nasal spray Place 2 sprays into the nose daily.    . pantoprazole (PROTONIX) 40 MG tablet Take 40 mg by mouth daily.    . phenol (CHLORASEPTIC) 1.4 % LIQD Use as directed 2 sprays in the mouth or throat every 4 (four) hours as needed for throat irritation / pain (for 48 hours).    . polyethylene glycol powder (GLYCOLAX/MIRALAX) powder Take 1 Container by mouth once.    . saccharomyces boulardii (FLORASTOR) 250 MG capsule Take 250 mg by mouth 2 (two) times daily.     No current facility-administered medications for this visit.     PAST MEDICAL HISTORY: Past Medical History:  Diagnosis Date  . Alzheimer's disease   . Anxiety   . Dementia   . Depression   . GERD (gastroesophageal reflux disease)   . Hallucinations   . Headache   . Heart beat abnormality   . Hypertension   . Kidney disease   . Osteoarthritis   . Stroke (HCC)   . TIA (transient ischemic attack)   . Vitamin B deficiency   . Vitamin D deficiency     PAST SURGICAL HISTORY: No past surgical history on file.  FAMILY HISTORY: Family History  Problem Relation Age of Onset  . Stroke Father   . Colon cancer Sister   . Kidney disease Sister     SOCIAL  HISTORY:  Social History   Social History  . Marital status: Legally Separated    Spouse name: N/A  . Number of children: 0  . Years of education: HS   Occupational History  . Retired    Social History Main Topics  . Smoking status: Never Smoker  . Smokeless tobacco: Never Used  . Alcohol use No  . Drug use: No  . Sexual activity: Not on file   Other Topics Concern  . Not on file   Social History Narrative   Lives at [facility].   Right-handed.   Occasional caffeine use.     PHYSICAL EXAM   Vitals:   01/16/16 1413  BP: 138/80  Pulse: 64  Weight: 141 lb (64 kg)  Height: 5' (  1.524 m)    Not recorded      Body mass index is 27.54 kg/m.  PHYSICAL EXAMNIATION:  Gen: NAD, conversant, well nourised, obese, well groomed                     Cardiovascular: Regular rate rhythm, no peripheral edema, warm, nontender. Eyes: Conjunctivae clear without exudates or hemorrhage Neck: Supple, no carotid bruise. Pulmonary: Clear to auscultation bilaterally   NEUROLOGICAL EXAM:  MENTAL STATUS: Speech:    Speech is normal; fluent and spontaneous with normal comprehension.  Cognition: MMSE 20/30, animal naming 8     Orientation: She is not oriented to date, year,     Recent and remote memory: She missed 3/3 recalls     Attention span and concentration: She has difficulties spelling world backwards     Normal Language, naming, repeating,spontaneous speech. she has difficulty copy finger     Fund of knowledge   CRANIAL NERVES: CN II: Visual fields are full to confrontation. Fundoscopic exam is normal with sharp discs and no vascular changes. Pupils are round equal and briskly reactive to light. CN III, IV, VI: extraocular movement are normal. No ptosis. CN V: Facial sensation is intact to pinprick in all 3 divisions bilaterally. Corneal responses are intact.  CN VII: Face is symmetric with normal eye closure and smile. CN VIII: Hearing is normal to rubbing fingers CN IX,  X: Palate elevates symmetrically. Phonation is normal. CN XI: Head turning and shoulder shrug are intact CN XII: Tongue is midline with normal movements and no atrophy.  MOTOR: There is no pronator drift of out-stretched arms. Muscle bulk and tone are normal. Muscle strength is normal.  REFLEXES: Reflexes are 2+ and symmetric at the biceps, triceps, knees, and ankles. Plantar responses are flexor.  SENSORY: Intact to light touch, pinprick, positional sensation and vibratory sensation are intact in fingers and toes.  COORDINATION: Rapid alternating movements and fine finger movements are intact. There is no dysmetria on finger-to-nose and heel-knee-shin.    GAIT/STANCE: Kyphosis, mildly unsteady, cautious  DIAGNOSTIC DATA (LABS, IMAGING, TESTING) - I reviewed patient records, labs, notes, testing and imaging myself where available.   ASSESSMENT AND PLAN  Cathy Rose is a 82 y.o. female    Dementia  Mini-Mental Status Examination 20 /30 Keep Namzaric 28/10 daily  Headaches  Much improved   Levert Feinstein, M.D. Ph.D.  [facility] [address], [address] [place], [place] [address] Ph: [phone] Fax: [phone]  CC: Cathy Cox, MD

## 2016-07-27 ENCOUNTER — Emergency Department (HOSPITAL_BASED_OUTPATIENT_CLINIC_OR_DEPARTMENT_OTHER)
Admit: 2016-07-27 | Discharge: 2016-07-27 | Disposition: A | Discharge status: Home / Self Care | Payer: Medicare Other | Attending: Emergency Medicine | Admitting: Emergency Medicine

## 2016-07-27 ENCOUNTER — Encounter (HOSPITAL_COMMUNITY): Payer: Self-pay

## 2016-07-27 ENCOUNTER — Emergency Department (HOSPITAL_COMMUNITY)
Admission: EM | Admit: 2016-07-27 | Discharge: 2016-07-27 | Disposition: A | Discharge status: Home / Self Care | Payer: Medicare Other | Attending: Emergency Medicine | Admitting: Emergency Medicine

## 2016-07-27 DIAGNOSIS — Z7902 Long term (current) use of antithrombotics/antiplatelets: Secondary | ICD-10-CM | POA: Diagnosis not present

## 2016-07-27 DIAGNOSIS — G309 Alzheimer's disease, unspecified: Secondary | ICD-10-CM | POA: Diagnosis not present

## 2016-07-27 DIAGNOSIS — Z791 Long term (current) use of non-steroidal anti-inflammatories (NSAID): Secondary | ICD-10-CM | POA: Insufficient documentation

## 2016-07-27 DIAGNOSIS — M79605 Pain in left leg: Secondary | ICD-10-CM | POA: Diagnosis present

## 2016-07-27 DIAGNOSIS — M7989 Other specified soft tissue disorders: Secondary | ICD-10-CM | POA: Insufficient documentation

## 2016-07-27 DIAGNOSIS — Z79899 Other long term (current) drug therapy: Secondary | ICD-10-CM | POA: Diagnosis not present

## 2016-07-27 DIAGNOSIS — I10 Essential (primary) hypertension: Secondary | ICD-10-CM | POA: Diagnosis not present

## 2016-07-27 LAB — BASIC METABOLIC PANEL
Anion gap: 8 (ref 5–15)
BUN: 22 mg/dL — ABNORMAL HIGH (ref 6–20)
CO2: 25 mmol/L (ref 22–32)
Calcium: 9.2 mg/dL (ref 8.9–10.3)
Chloride: 108 mmol/L (ref 101–111)
Creatinine, Ser: 1.21 mg/dL — ABNORMAL HIGH (ref 0.44–1.00)
GFR calc Af Amer: 44 mL/min — ABNORMAL LOW (ref >60)
GFR calc non Af Amer: 38 mL/min — ABNORMAL LOW (ref >60)
Glucose, Bld: 97 mg/dL (ref 65–99)
Potassium: 4.4 mmol/L (ref 3.5–5.1)
Sodium: 141 mmol/L (ref 135–145)

## 2016-07-27 LAB — CBC WITH DIFFERENTIAL/PLATELET
Basophils Absolute: 0 10*3/uL (ref 0.0–0.1)
Basophils Relative: 1 %
Eosinophils Absolute: 0.1 10*3/uL (ref 0.0–0.7)
Eosinophils Relative: 2 %
HCT: 35.1 % — ABNORMAL LOW (ref 36.0–46.0)
Hemoglobin: 12 g/dL (ref 12.0–15.0)
Lymphocytes Relative: 16 %
Lymphs Abs: 1 10*3/uL (ref 0.7–4.0)
MCH: 31.2 pg (ref 26.0–34.0)
MCHC: 34.2 g/dL (ref 30.0–36.0)
MCV: 91.2 fL (ref 78.0–100.0)
Monocytes Absolute: 0.3 10*3/uL (ref 0.1–1.0)
Monocytes Relative: 6 %
Neutro Abs: 4.7 10*3/uL (ref 1.7–7.7)
Neutrophils Relative %: 75 %
Platelets: 206 10*3/uL (ref 150–400)
RBC: 3.85 MIL/uL — ABNORMAL LOW (ref 3.87–5.11)
RDW: 13.7 % (ref 11.5–15.5)
WBC: 6.2 10*3/uL (ref 4.0–10.5)

## 2016-07-27 LAB — CK: Total CK: 59 U/L (ref 38–234)

## 2016-07-27 NOTE — ED Provider Notes (Signed)
[facility]-EMERGENCY DEPT Provider Note   CSN: [id number] Arrival date & time: 07/27/16  4098     History   Chief Complaint Chief Complaint  Patient presents with  . Leg Pain    HPI Cathy Rose is a 83 y.o. female.  HPI Pt comes in with cc of sore leg - especially the thigh region. Pt has hx of OA, HTN.  Pt's symptoms have been present for the past few days. Pt has no hx of PE, DVT and denies any exogenous hormone (testosterone / estrogen) use, long distance travels or surgery in the past 6 weeks, active cancer, recent immobilization. Pt ambulates with a walker.   Past Medical History:  Diagnosis Date  . Alzheimer's disease   . Anxiety   . Dementia   . Depression   . GERD (gastroesophageal reflux disease)   . Hallucinations   . Headache   . Heart beat abnormality   . Hypertension   . Kidney disease   . Osteoarthritis   . Stroke (HCC)   . TIA (transient ischemic attack)   . Vitamin B deficiency   . Vitamin D deficiency     Patient Active Problem List   Diagnosis Date Noted  . Abnormality of gait 01/16/2016  . Headache 06/04/2014  . Normochromic anemia 06/04/2014  . H/O: CVA (cerebrovascular accident) 06/04/2014  . Dementia     No past surgical history on file.  OB History    No data available       Home Medications    Prior to Admission medications   Medication Sig Start Date End Date Taking? Authorizing Provider  amLODipine (NORVASC) 10 MG tablet Take 10 mg by mouth daily.   Yes [provider]  celecoxib (CELEBREX) 200 MG capsule Take 200 mg by mouth daily.   Yes [provider]  cetaphil (CETAPHIL) cream Apply 1 application topically as needed.   Yes [provider]  Cholecalciferol 50000 units capsule Take 50,000 Units by mouth every 30 (thirty) days.   Yes [provider]  citalopram (CELEXA) 40 MG tablet Take 40 mg by mouth daily.   Yes [provider]  clopidogrel (PLAVIX) 75 MG tablet Take 75 mg  by mouth daily.   Yes [provider]  diclofenac sodium (VOLTAREN) 1 % GEL Apply 2 g topically 2 (two) times daily as needed. 06/06/14  Yes Mikhail, Maryann, DO  fluticasone (FLONASE) 50 MCG/ACT nasal spray Place 1 spray into both nostrils daily.   Yes [provider]  hydrALAZINE (APRESOLINE) 25 MG tablet Take 25 mg by mouth 2 (two) times daily.   Yes [provider]  levocetirizine (XYZAL) 5 MG tablet Take 5 mg by mouth every evening.   Yes [provider]  Memantine HCl-Donepezil HCl (NAMZARIC) 7-10 MG CP24 Take 1 tablet by mouth daily.   Yes [provider]  pantoprazole (PROTONIX) 40 MG tablet Take 40 mg by mouth daily.   Yes [provider]  polyethylene glycol powder (GLYCOLAX/MIRALAX) powder Take 1 Container by mouth as needed.    Yes [provider]    Family History Family History  Problem Relation Age of Onset  . Stroke Father   . Colon cancer Sister   . Kidney disease Sister     Social History Social History  Substance Use Topics  . Smoking status: Never Smoker  . Smokeless tobacco: Never Used  . Alcohol use No     Allergies   Patient has no known allergies.  Review of Systems Review of Systems  Constitutional: Negative for activity change.  Respiratory: Negative for shortness of breath.   Cardiovascular: Negative for chest pain.  Gastrointestinal: Negative for abdominal pain, nausea and vomiting.  Genitourinary: Negative for dysuria.  Musculoskeletal: Positive for myalgias. Negative for gait problem and neck pain.  Skin: Negative for rash.  Neurological: Negative for headaches.     Physical Exam Updated Vital Signs BP (!) 143/70   Pulse 81   Temp 98 F (36.7 C) (Oral)   Resp 16   SpO2 99%   Physical Exam  Constitutional: She is oriented to person, place, and time. She appears well-developed.  HENT:  Head: Normocephalic and atraumatic.  Eyes: EOM are normal.  Neck: Normal range of  motion. Neck supple.  Cardiovascular: Normal rate.   Pulmonary/Chest: Effort normal.  Abdominal: Bowel sounds are normal.  Musculoskeletal: She exhibits edema. She exhibits no tenderness or deformity.  Neurological: She is alert and oriented to person, place, and time.  Skin: Skin is warm and dry. No erythema.  Nursing note and vitals reviewed.    ED Treatments / Results  Labs (all labs ordered are listed, but only abnormal results are displayed) Labs Reviewed  CBC WITH DIFFERENTIAL/PLATELET - Abnormal; Notable for the following:       Result Value   RBC 3.85 (*)    HCT 35.1 (*)    All other components within normal limits  BASIC METABOLIC PANEL - Abnormal; Notable for the following:    BUN 22 (*)    Creatinine, Ser 1.21 (*)    GFR calc non Af Amer 38 (*)    GFR calc Af Amer 44 (*)    All other components within normal limits  CK    EKG  EKG Interpretation None       Radiology No results found.  Procedures Procedures (including critical care time)  Medications Ordered in ED Medications - No data to display   Initial Impression / Assessment and Plan / ED Course  I have reviewed the triage vital signs and the nursing notes.  Pertinent labs & imaging results that were available during my care of the patient were reviewed by me and considered in my medical decision making (see chart for details).     DDx: DVT Cellulitis Osteomyelitis Superficial thrombophlebitis Osteomyelitis Trauma  Pt has no signs of trauma. She is ambulatory and I walked her. Pt has no signs of cellulitis. Thus US was ordered for unilateral lower extremity swelling and it is neg for DVT. Plan is to get pcp to f/u.   Final Clinical Impressions(s) / ED Diagnoses   Final diagnoses:  Left leg swelling    New Prescriptions Discharge Medication List as of 07/27/2016  1:29 PM       Derwood KaplanNanavati, Rubina Basinski, MD 07/28/16 [phone]

## 2016-07-27 NOTE — Progress Notes (Signed)
*  Preliminary Results* Left lower extremity venous duplex completed. Left lower extremity is negative for deep vein thrombosis. There is no evidence of left Baker's cyst.  07/27/2016 11:04 AM  Gertie FeyMichelle Angala Hilgers, BS, RVT, RDCS, RDMS

## 2016-07-27 NOTE — ED Triage Notes (Signed)
She reports non-traumatic left hip/leg pain which started early this morning. She is oriented, and tells me her legs "felt fine" yesterday. CMS intact bilat. Feet and all toes bilat.

## 2016-07-27 NOTE — ED Notes (Signed)
Bed: [id number] Expected date:  Expected time:  Means of arrival:  Comments: Left leg pain

## 2016-07-27 NOTE — ED Notes (Signed)
Patient ambulatory to restroom with staff stand by assistance without difficulty.

## 2016-07-27 NOTE — Discharge Instructions (Addendum)
We are not sure what is causing the leg swelling. There is no signs of blood clots.  If you have increased pain, increased redness or swelling to the leg - please come back to the ER. Otherwise, please see your primary doctor in 1 week.

## 2016-11-21 IMAGING — DX DG HAND COMPLETE 3+V*L*
3 series · 3 of 3 positions shown · non-contrast
Comparison: None.

CLINICAL DATA: Fall today.  Pain.

EXAM:
LEFT HAND - COMPLETE 3+ VIEW

[x hand pa left]
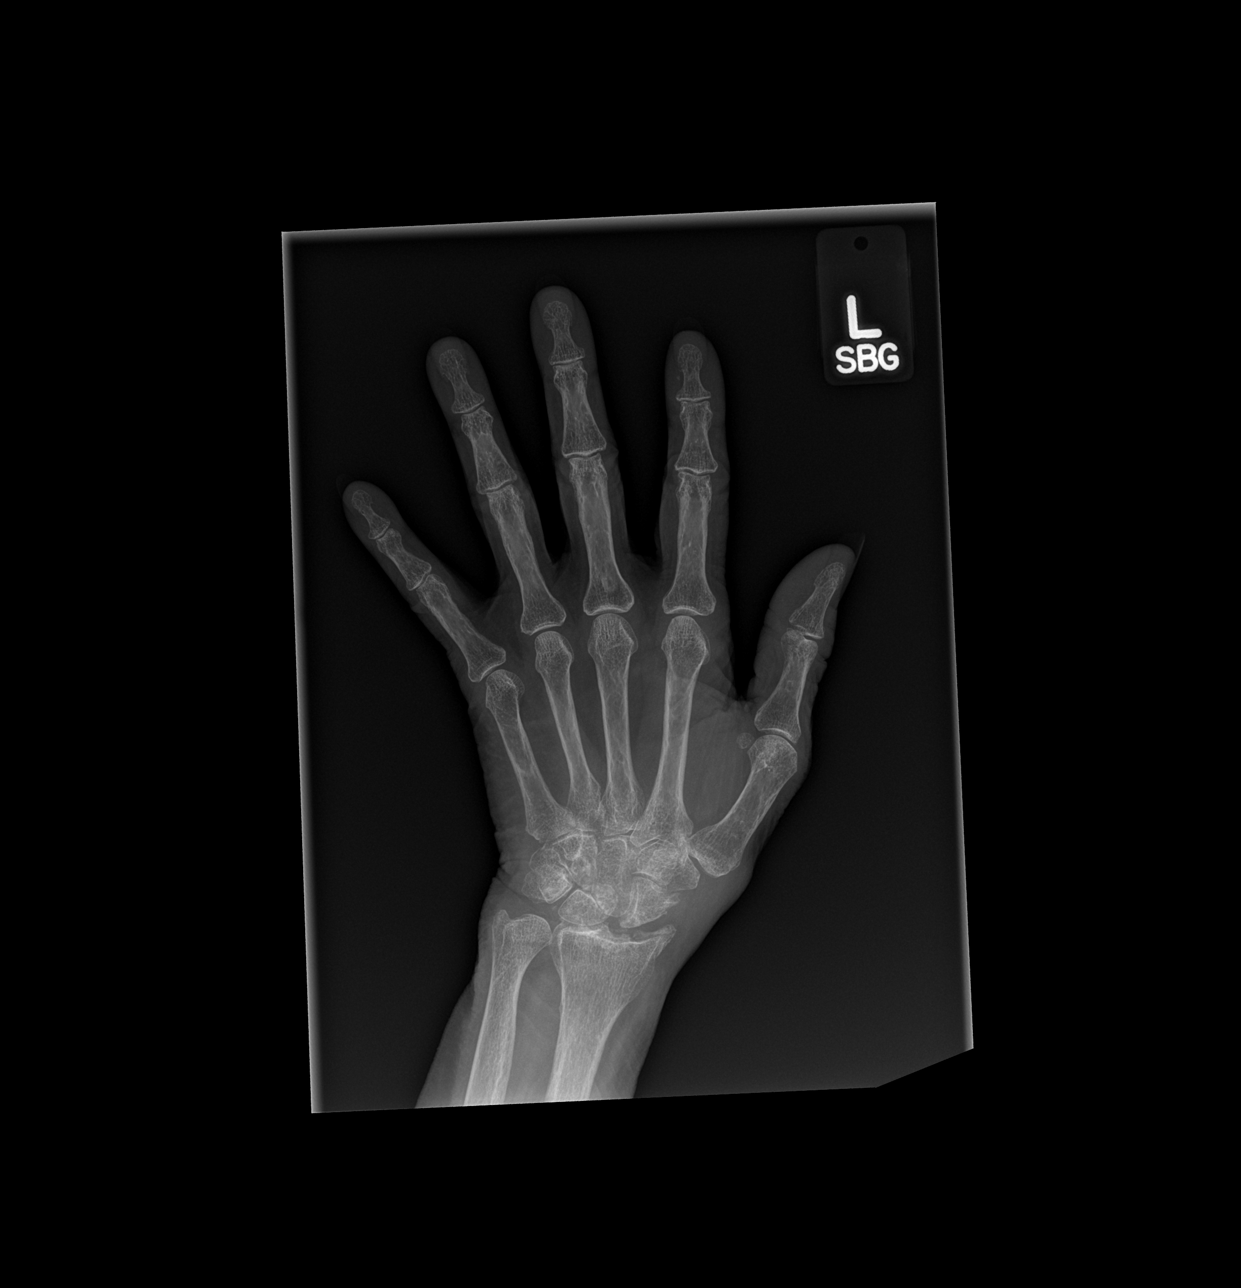

[x hand obl left]
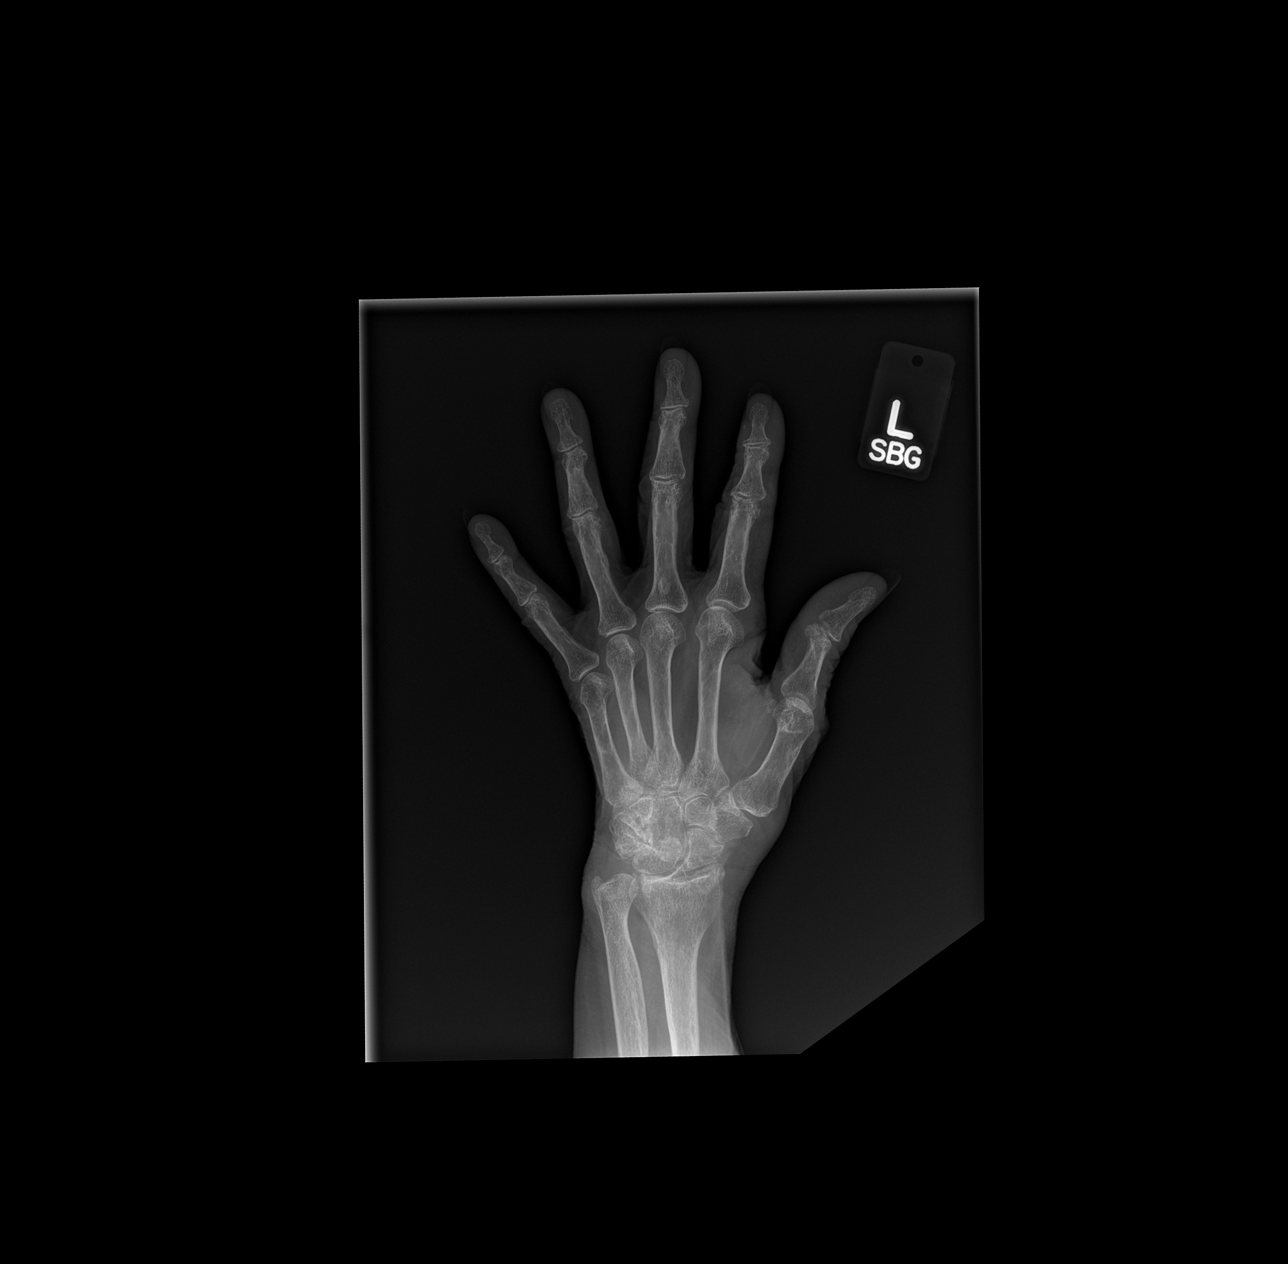

[x hand lat left]
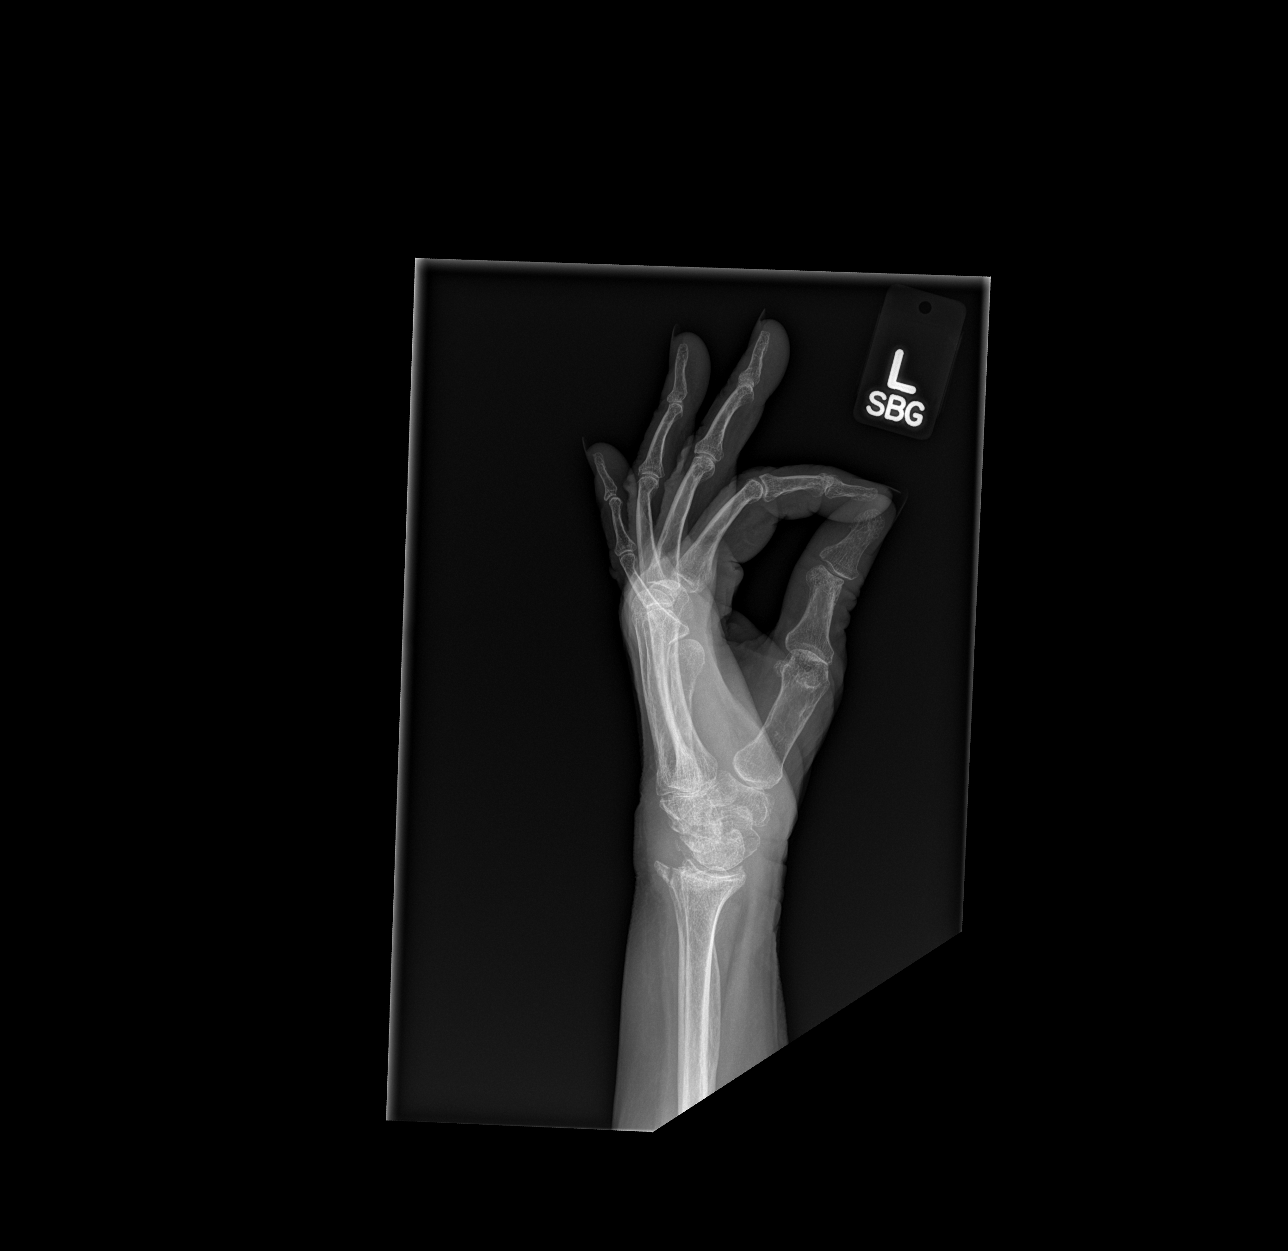

[3 of 3 positions shown; findings below may reference images not displayed]

FINDINGS: Degenerative change in the radiocarpal joint with joint space
narrowing and prominent spurring of the distal radius and radial
styloid. On the lateral view, there is a fracture through the distal
radius involving the dorsal osteophyte. Mild displacement.

No fracture of the carpal bones.
IMPRESSION: Extensive radiocarpal degenerative change. Fracture of a dorsal
osteophyte of the distal radius.

## 2017-01-14 NOTE — Progress Notes (Deleted)
[facility]  PATIENT: Cathy Rose DOB: 22-Feb-1924   REASON FOR VISIT: *** HISTORY FROM:    HISTORY OF PRESENT ILLNESS:Cathy Rose is 83 years old right-handed female, accompanied by her niece Cathy Rose, who is also her power of attorney, seen in refer by her primary care doctor Angela Cox for evaluation of frequent headaches  I reviewed and summarized referring note, she had a history of hypertension, vitamin D deficiency, mild memory loss, history of depression in the past, osteoarthritis, vitamin B12 deficiency and iron deficiency, acid reflux, chronic insomnia, mild cognitive impairment with hallucinations,   She used to be the caregiver of her son, who had a lot of medical illness, who just passed away in 30-Jun-2014, she moved to [facility] assistance living since Jun 30, 2014, she graduated from high school, retired from Set designer job, moved from [place] to [place] in her sixties  Since 29-Jun-2012, she was noted to have memory loss, hallucinations, usually visual hallucinations, she saw people sitting at her house, no auditory hallucinations, previously she called police multiple times to check on those people, now she realizes they are not real, for a while, she has difficulty keeping up with her household payments, had her water cut off, now her niece become her power of attorney, she is taking Namenda XR 28 mg since 06/30/14, she reported mild cognitive impairment, she sleeps well, eats well  Today she came in with a main complaint of morning headache since summer of June 30, 2015 every morning she woke up with bilateral frontal temporal pressure headaches, up to 8/10, lasting for couple hours, she has to take Tylenol almost on daily basis for that, she sleeps well, go to bed around 10:00, niece reported that she snores a lot, had frequent choking episode observed, she does complain of dry most 1 waking up, tends to get sip of water when she woke few  times in the middle of the night using bathroom,  I reviewed laboratory evaluation in 06/30/2015, creatinine 0.96, normal CBC, hemoglobin 12 point 6, normal CMP, vitamin B12 382, most recent was 1945, ferritin level was 13, TIBC is 329, iron level was 31, folic acid 7.2, vitamin D level was 20 3.4, previously low vitamin B12 134 in October 2016, she often misses her breakfast, her headache was much improved around 10 to 11:00  Have personally reviewed MRI of the brain in 06-02-16moderate generalized atrophy, mild to moderate supratentorium small vessel disease.,  UPDATE Dec 19th 2017: She is alone at visit, dropped off by [facility] transportation, she denies significant pain, no headaches, mild memory loss, MMSE 20/30, she has good appetite, sleeps well.     REVIEW OF SYSTEMS: Full 14 system review of systems performed and notable only for those listed, all others are neg:  Constitutional: neg  Cardiovascular: neg Ear/Nose/Throat: neg  Skin: neg Eyes: neg Respiratory: neg Gastroitestinal: neg  Hematology/Lymphatic: neg  Endocrine: neg Musculoskeletal:neg Allergy/Immunology: neg Neurological: neg Psychiatric: neg Sleep : neg   ALLERGIES: No Known Allergies  HOME MEDICATIONS: Outpatient Medications Prior to Visit  Medication Sig Dispense Refill  . amLODipine (NORVASC) 10 MG tablet Take 10 mg by mouth daily.    . celecoxib (CELEBREX) 200 MG capsule Take 200 mg by mouth daily.    . cetaphil (CETAPHIL) cream Apply 1 application topically as needed.    . Cholecalciferol 50000 units capsule Take 50,000 Units by mouth every 30 (thirty) days.    . citalopram (CELEXA) 40 MG tablet Take 40  mg by mouth daily.    . clopidogrel (PLAVIX) 75 MG tablet Take 75 mg by mouth daily.    . diclofenac sodium (VOLTAREN) 1 % GEL Apply 2 g topically 2 (two) times daily as needed.    . fluticasone (FLONASE) 50 MCG/ACT nasal spray Place 1 spray into both nostrils daily.    . hydrALAZINE (APRESOLINE) 25  MG tablet Take 25 mg by mouth 2 (two) times daily.    Marland Kitchen. levocetirizine (XYZAL) 5 MG tablet Take 5 mg by mouth every evening.    . Memantine HCl-Donepezil HCl (NAMZARIC) 7-10 MG CP24 Take 1 tablet by mouth daily.    . pantoprazole (PROTONIX) 40 MG tablet Take 40 mg by mouth daily.    . polyethylene glycol powder (GLYCOLAX/MIRALAX) powder Take 1 Container by mouth as needed.      No facility-administered medications prior to visit.     PAST MEDICAL HISTORY: Past Medical History:  Diagnosis Date  . Alzheimer's disease   . Anxiety   . Dementia   . Depression   . GERD (gastroesophageal reflux disease)   . Hallucinations   . Headache   . Heart beat abnormality   . Hypertension   . Kidney disease   . Osteoarthritis   . Stroke (HCC)   . TIA (transient ischemic attack)   . Vitamin B deficiency   . Vitamin D deficiency     PAST SURGICAL HISTORY: No past surgical history on file.  FAMILY HISTORY: Family History  Problem Relation Age of Onset  . Stroke Father   . Colon cancer Sister   . Kidney disease Sister     SOCIAL HISTORY: Social History   Socioeconomic History  . Marital status: Legally Separated    Spouse name: Not on file  . Number of children: 0  . Years of education: HS  . Highest education level: Not on file  Social Needs  . Financial resource strain: Not on file  . Food insecurity - worry: Not on file  . Food insecurity - inability: Not on file  . Transportation needs - medical: Not on file  . Transportation needs - non-medical: Not on file  Occupational History  . Occupation: Retired  Tobacco Use  . Smoking status: Never Smoker  . Smokeless tobacco: Never Used  Substance and Sexual Activity  . Alcohol use: No  . Drug use: No  . Sexual activity: Not on file  Other Topics Concern  . Not on file  Social History Narrative   Lives at [facility].   Right-handed.   Occasional caffeine use.     PHYSICAL EXAM  There were no vitals filed for this  visit. There is no height or weight on file to calculate BMI.  Generalized: Well developed, in no acute distress  Head: normocephalic and atraumatic,. Oropharynx benign  Neck: Supple, no carotid bruits  Cardiac: Regular rate rhythm, no murmur  Musculoskeletal: No deformity   Neurological examination   Mentation: Alert oriented to time, place, history taking. Attention span and concentration appropriate. Recent and remote memory intact.  Follows all commands speech and language fluent.   Cranial nerve II-XII: Fundoscopic exam reveals sharp disc margins.Pupils were equal round reactive to light extraocular movements were full, visual field were full on confrontational test. Facial sensation and strength were normal. hearing was intact to finger rubbing bilaterally. Uvula tongue midline. head turning and shoulder shrug were normal and symmetric.Tongue protrusion into cheek strength was normal. Motor: normal bulk and tone, full strength in the BUE,  BLE, fine finger movements normal, no pronator drift. No focal weakness Sensory: normal and symmetric to light touch, pinprick, and  Vibration, proprioception  Coordination: finger-nose-finger, heel-to-shin bilaterally, no dysmetria Reflexes: Brachioradialis 2/2, biceps 2/2, triceps 2/2, patellar 2/2, Achilles 2/2, plantar responses were flexor bilaterally. Gait and Station: Rising up from seated position without assistance, normal stance,  moderate stride, good arm swing, smooth turning, able to perform tiptoe, and heel walking without difficulty. Tandem gait is steady  DIAGNOSTIC DATA (LABS, IMAGING, TESTING) - I reviewed patient records, labs, notes, testing and imaging myself where available.  Lab Results  Component Value Date   WBC 6.2 07/27/2016   HGB 12.0 07/27/2016   HCT 35.1 (L) 07/27/2016   MCV 91.2 07/27/2016   PLT 206 07/27/2016      Component Value Date/Time   NA 141 07/27/2016 1024   K 4.4 07/27/2016 1024   CL 108 07/27/2016  1024   CO2 25 07/27/2016 1024   GLUCOSE 97 07/27/2016 1024   BUN 22 (H) 07/27/2016 1024   CREATININE 1.21 (H) 07/27/2016 1024   CALCIUM 9.2 07/27/2016 1024   PROT 6.6 11/25/2015 1622   ALBUMIN 4.1 11/25/2015 1622   AST 16 11/25/2015 1622   ALT 10 (L) 11/25/2015 1622   ALKPHOS 76 11/25/2015 1622   BILITOT 0.4 11/25/2015 1622   GFRNONAA 38 (L) 07/27/2016 1024   GFRAA 44 (L) 07/27/2016 1024   No results found for: CHOL, HDL, LDLCALC, LDLDIRECT, TRIG, CHOLHDL No results found for: ZOXW9UHGBA1C Lab Results  Component Value Date   VITAMINB12 317 06/04/2014   No results found for: TSH  ***  ASSESSMENT AND PLAN  83 y.o. year old female  has a past medical history of Alzheimer's disease, Anxiety, Dementia, Depression, GERD (gastroesophageal reflux disease), Hallucinations, Headache, Heart beat abnormality, Hypertension, Kidney disease, Osteoarthritis, Stroke (HCC), TIA (transient ischemic attack), Vitamin B deficiency, and Vitamin D deficiency. here with ***  Toye Nadene RubinsBeatrice Delay is a 83 y.o. female    Dementia  Mini-Mental Status Examination 20 /30 Keep Namzaric 28/10 daily  Headaches             Much improved   Nilda RiggsNancy Carolyn Babacar Haycraft, [facility] HospitalGNP, [facility], APRN  [facility] [address], [address] [place], [place] [address] [phone]

## 2017-01-15 ENCOUNTER — Ambulatory Visit: Payer: Medicare Other | Admitting: Nurse Practitioner

## 2017-01-16 ENCOUNTER — Encounter: Payer: Self-pay | Admitting: Nurse Practitioner

## 2017-04-11 ENCOUNTER — Ambulatory Visit: Payer: Medicare Other | Admitting: Nurse Practitioner

## 2017-07-22 LAB — PROTIME-INR

## 2017-11-20 ENCOUNTER — Encounter: Payer: Self-pay | Admitting: Primary Care

## 2017-11-20 ENCOUNTER — Ambulatory Visit (INDEPENDENT_AMBULATORY_CARE_PROVIDER_SITE_OTHER): Payer: Medicare HMO | Admitting: Primary Care

## 2017-11-20 VITALS — BP 114/70 | HR 64 | Temp 97.7°F | Ht <= 58 in | Wt 134.2 lb

## 2017-11-20 DIAGNOSIS — F0390 Unspecified dementia without behavioral disturbance: Secondary | ICD-10-CM | POA: Diagnosis not present

## 2017-11-20 DIAGNOSIS — Z23 Encounter for immunization: Secondary | ICD-10-CM | POA: Diagnosis not present

## 2017-11-20 DIAGNOSIS — I10 Essential (primary) hypertension: Secondary | ICD-10-CM

## 2017-11-20 NOTE — Patient Instructions (Addendum)
Please schedule a follow up appointment in 6 months.   It was a pleasure to meet you today! Please don't hesitate to call or message me with any questions. Welcome to [facility]!

## 2017-11-20 NOTE — Assessment & Plan Note (Signed)
Managed on Amlodipine 5 mg and hydralazine 25 mg daily. Discussed to monitor BP at home and if reading continue to remain lower then stop hydralazine.

## 2017-11-20 NOTE — Assessment & Plan Note (Signed)
Alert and oriented, following commands. Doing well off of all medications including citalopram, Aricept, Namenda. Continue to monitor.

## 2017-11-20 NOTE — Progress Notes (Signed)
Subjective:    Patient ID: Cathy Rose, female    DOB: [birth year], 84 y.o.   MRN: [id number]  HPI  Cathy Rose is a 84 year old female who presents today to establish care and discuss the problems mentioned below. Will obtain old records. She is accompanied by her niece today.  She moved in with her niece on 09/25/17 from assisted living at [facility]. She is following with [facility] and Palliative Care, for palliative care services. She is visited by physical therapy twice weekly then will be done after next week as she's doing so well. She ambulates well with her walker.   1) Essential Hypertension: Currently managed on amlodipine 5 mg and hydralazine 25 mg once daily. She was once taking hydralazine twice daily but her niece reduced the dose due to hypotension. She denies chest pain, dizziness, shortness of breath.   BP Readings from Last 3 Encounters:  11/20/17 114/70  07/27/16 (!) 143/70  01/16/16 138/80    2) GERD: Previously managed on pantoprazole 40 mg. She stopped taking this nearly two months ago. She denies symptoms of esophageal reflux, burning, belching, abdominal pain.    3) Dementia with behavorial disturbance: Previously managed on citalopram 20 mg, memantine-donepezil-m 7-10 mg. Her niece states that she's doing well off of these medications which were removed in late August.   Review of Systems  Respiratory: Negative for shortness of breath.   Cardiovascular: Negative for chest pain.  Gastrointestinal:       Denies GERD symptoms  Neurological: Negative for dizziness and headaches.       Past Medical History:  Diagnosis Date  . Alzheimer's disease (HCC)   . Anxiety   . Dementia (HCC)   . Depression   . GERD (gastroesophageal reflux disease)   . Hallucinations   . Headache   . Heart beat abnormality   . Hypertension   . Kidney disease   . Osteoarthritis   . Stroke (HCC)   . TIA (transient ischemic attack)   . Vitamin B deficiency     . Vitamin D deficiency      Social History   Socioeconomic History  . Marital status: Legally Separated    Spouse name: Not on file  . Number of children: 0  . Years of education: HS  . Highest education level: Not on file  Occupational History  . Occupation: Retired  Engineer, production  . Financial resource strain: Not on file  . Food insecurity:    Worry: Not on file    Inability: Not on file  . Transportation needs:    Medical: Not on file    Non-medical: Not on file  Tobacco Use  . Smoking status: Never Smoker  . Smokeless tobacco: Never Used  Substance and Sexual Activity  . Alcohol use: No  . Drug use: No  . Sexual activity: Not on file  Lifestyle  . Physical activity:    Days per week: Not on file    Minutes per session: Not on file  . Stress: Not on file  Relationships  . Social connections:    Talks on phone: Not on file    Gets together: Not on file    Attends religious service: Not on file    Active member of club or organization: Not on file    Attends meetings of clubs or organizations: Not on file    Relationship status: Not on file  . Intimate partner violence:    Fear of current  or ex partner: Not on file    Emotionally abused: Not on file    Physically abused: Not on file    Forced sexual activity: Not on file  Other Topics Concern  . Not on file  Social History Narrative   Lives at [place].   Right-handed.   Occasional caffeine use.    No past surgical history on file.  Family History  Problem Relation Age of Onset  . Stroke Father   . Colon cancer Sister   . Kidney disease Sister     No Known Allergies  Current Outpatient Medications on File Prior to Visit  Medication Sig Dispense Refill  . acetaminophen (TYLENOL) 500 MG tablet Take 500 mg by mouth every 6 (six) hours as needed.    . hydrALAZINE (APRESOLINE) 25 MG tablet Take 25 mg by mouth daily.    . polyethylene glycol powder (GLYCOLAX/MIRALAX) powder Take 1 Container by mouth  as needed.     Marland Kitchen amLODipine (NORVASC) 5 MG tablet TK 1 T PO D  3   No current facility-administered medications on file prior to visit.     BP 114/70   Pulse 64   Temp 97.7 F (36.5 C) (Oral)   Ht 4' 8.25" (1.429 m)   Wt 134 lb 4 oz (60.9 kg)   SpO2 97%   BMI 29.83 kg/m    Objective:   Physical Exam  Constitutional: She is oriented to person, place, and time. She appears well-nourished.  Neck: Neck supple.  Cardiovascular: Normal rate and regular rhythm.  Respiratory: Effort normal and breath sounds normal.  Neurological: She is alert and oriented to person, place, and time.  Follows commands, doesn't provide much in terms of HPI.  Skin: Skin is warm and dry.  Psychiatric: She has a normal mood and affect.           Assessment & Plan:

## 2017-11-21 ENCOUNTER — Telehealth: Payer: Self-pay | Admitting: Primary Care

## 2017-11-21 NOTE — Telephone Encounter (Signed)
Copied from CRM [id number]. Topic: Quick Communication - Home Health Verbal Orders >> Nov 21, 2017 11:47 AM Windy Kalata, NT wrote: Caller/Agency: [facility] Callback Number: [phone] Requesting OT/PT/Skilled Nursing/Social Work: PT Frequency: 1x a week for 2 weeks

## 2017-11-21 NOTE — Telephone Encounter (Signed)
Message left for Mel to return my call.

## 2017-11-21 NOTE — Telephone Encounter (Signed)
Cathy Rose at [facility] notified as instructed and voiced understanding. Nothing further needed.

## 2017-11-21 NOTE — Telephone Encounter (Signed)
Mel returning call to Roff.

## 2017-11-21 NOTE — Telephone Encounter (Signed)
Approved.  

## 2017-11-28 ENCOUNTER — Telehealth: Payer: Self-pay | Admitting: Primary Care

## 2017-11-28 NOTE — Telephone Encounter (Signed)
Rec'd from [facility] forwarded 332 pages to  Doreene Nest. NP

## 2018-01-08 ENCOUNTER — Telehealth: Payer: Self-pay | Admitting: Primary Care

## 2018-01-08 DIAGNOSIS — Z008 Encounter for other general examination: Secondary | ICD-10-CM

## 2018-01-08 NOTE — Telephone Encounter (Signed)
Noted, referral placed.  

## 2018-01-08 NOTE — Telephone Encounter (Signed)
[facility] Ezzard FlaxJennifer Galoway. Pt was told a referral needs to be put in. Pt is requesting order to be put in.

## 2018-01-08 NOTE — Telephone Encounter (Signed)
Please notify patient that I am happy to place the referral, but I need some additional information.  What is the reason for her seeing the podiatrist?

## 2018-01-08 NOTE — Telephone Encounter (Signed)
Spoken to [name] (on DPR). She stated that reason is that patient has thick toe nails and need routine foot care that she is unable to provide for patient.

## 2018-01-15 ENCOUNTER — Ambulatory Visit: Payer: Medicare Other | Admitting: Podiatry

## 2018-01-22 ENCOUNTER — Ambulatory Visit (INDEPENDENT_AMBULATORY_CARE_PROVIDER_SITE_OTHER): Payer: Medicare HMO | Admitting: Podiatry

## 2018-01-22 ENCOUNTER — Encounter: Payer: Self-pay | Admitting: Podiatry

## 2018-01-22 VITALS — BP 122/71

## 2018-01-22 DIAGNOSIS — M79675 Pain in left toe(s): Secondary | ICD-10-CM

## 2018-01-22 DIAGNOSIS — M79674 Pain in right toe(s): Secondary | ICD-10-CM | POA: Diagnosis not present

## 2018-01-22 DIAGNOSIS — B351 Tinea unguium: Secondary | ICD-10-CM | POA: Diagnosis not present

## 2018-01-22 NOTE — Patient Instructions (Signed)

## 2018-02-05 ENCOUNTER — Telehealth: Payer: Self-pay | Admitting: Primary Care

## 2018-02-05 DIAGNOSIS — R29898 Other symptoms and signs involving the musculoskeletal system: Secondary | ICD-10-CM

## 2018-02-05 DIAGNOSIS — Z9181 History of falling: Secondary | ICD-10-CM

## 2018-02-05 NOTE — Telephone Encounter (Signed)
Pt's niece [name] (on dpr) called office is requesting a order to be put in to get PT at [facility] for the leg pain she is having. The pt has to have help when walking.

## 2018-02-05 NOTE — Telephone Encounter (Signed)
Left leg pain is worse when she is trying to get in the car. Dull achy pain all the time. She can only walk a short distance. She is using a walker. She fell 3 months ago at [facility]. Weakness in left side. [facility] d/c mentioned left leg weakness. PT did help a lot. No redness, swelling, or warmth.

## 2018-02-05 NOTE — Telephone Encounter (Signed)
Will you please get more detail?  Which leg is bothering her?   Does it hurt when she walks?   Does she have difficulty walking?   Is using a walker or assistive device? Did she fall? Is she experiencing any weakness? Did her physical therapy in October help?  Is she having any redness, swelling, warmth to the leg?  If so then she needs to be seen.

## 2018-02-06 NOTE — Addendum Note (Signed)
Addended by: Doreene Nest on: 02/06/2018 01:09 PM   Modules accepted: Orders

## 2018-02-06 NOTE — Telephone Encounter (Signed)
Noted  Referral placed.

## 2018-02-21 ENCOUNTER — Encounter: Payer: Self-pay | Admitting: Podiatry

## 2018-02-21 NOTE — Progress Notes (Signed)
Subjective: Cathy Rose presents today accompanied by her niece.  She was referred by Doreene Nest, NP.  She is a former resident of [facility] assisted living.  She now lives with her niece full-time.  Per niece, she is seen today with for painful, discolored, thick toenails which her niece cannot cut. Pain is aggravated when wearing enclosed shoe gear.   Past Medical History:  Diagnosis Date  . Alzheimer's disease (HCC)   . Anxiety   . Dementia (HCC)   . Depression   . GERD (gastroesophageal reflux disease)   . Hallucinations   . Headache   . Heart beat abnormality   . Hypertension   . Kidney disease   . Osteoarthritis   . Stroke (HCC)   . TIA (transient ischemic attack)   . Vitamin B deficiency   . Vitamin D deficiency      Patient Active Problem List   Diagnosis Date Noted  . Essential hypertension 11/20/2017  . Normochromic anemia 06/04/2014  . H/O: CVA (cerebrovascular accident) 06/04/2014  . Dementia (HCC)      No past surgical history on file.    Current Outpatient Medications:  .  acetaminophen (TYLENOL) 500 MG tablet, Take 500 mg by mouth every 6 (six) hours as needed., Disp: , Rfl:  .  amLODipine (NORVASC) 5 MG tablet, TK 1 T PO D, Disp: , Rfl: 3 .  hydrALAZINE (APRESOLINE) 25 MG tablet, Take 25 mg by mouth daily., Disp: , Rfl:  .  polyethylene glycol powder (GLYCOLAX/MIRALAX) powder, Take 1 Container by mouth as needed. , Disp: , Rfl:    No Known Allergies   Social History   Occupational History  . Occupation: Retired  Tobacco Use  . Smoking status: Never Smoker  . Smokeless tobacco: Never Used  Substance and Sexual Activity  . Alcohol use: No  . Drug use: No  . Sexual activity: Not on file     Family History  Problem Relation Age of Onset  . Stroke Father   . Colon cancer Sister   . Kidney disease Sister      Immunization History  Administered Date(s) Administered  . Influenza,inj,Quad PF,6+ Mos 11/20/2017    Review of  systems: Positive Findings in bold print.  Constitutional:  chills, fatigue, fever, sweats, weight change Communication: Nurse, learning disability, sign Presenter, broadcasting, hand writing, iPad/Android device Head: headaches, head injury Eyes: changes in vision, eye pain, glaucoma, cataracts, macular degeneration, diplopia, glare,  light sensitivity, eyeglasses or contacts, blindness Ears nose mouth throat: Hard of hearing, ringing in ears, deaf, sign language,  vertigo,   nosebleeds,  rhinitis,  cold sores, snoring, swollen glands Cardiovascular: HTN, edema, arrhythmia, pacemaker in place, defibrillator in place,  chest pain/tightness, chronic anticoagulation, blood clot, heart failure Peripheral Vascular: leg cramps, varicose veins, blood clots, lymphedema Respiratory:  difficulty breathing, denies congestion, SOB, wheezing, cough, emphysema Gastrointestinal: change in appetite or weight, abdominal pain, constipation, diarrhea, nausea, vomiting, vomiting blood, change in bowel habits, abdominal pain, jaundice, rectal bleeding, hemorrhoids, GERD Genitourinary:  nocturia,  pain on urination,  blood in urine, Foley catheter, urinary urgency Musculoskeletal: uses mobility aid,  cramping, stiff joints, painful joints, decreased joint motion, fractures, OA, gout Skin: +changes in toenails, color change, dryness, itching, mole changes,  rash  Neurological: headaches, numbness in feet, paresthesias in feet, burning in feet, fainting,  seizures, change in speech. denies headaches, memory problems/poor historian, cerebral palsy, weakness, paralysis, CVA Endocrine: diabetes, hypothyroidism, hyperthyroidism,  goiter, dry mouth, flushing, heat intolerance,  cold intolerance,  excessive thirst, denies polyuria,  nocturia Hematological:  easy bleeding, excessive bleeding, easy bruising, enlarged lymph nodes, on long term blood thinner, history of past transusions Allergy/immunological:  hives, eczema, frequent infections,  multiple drug allergies, seasonal allergies, transplant recipient Psychiatric:  anxiety, depression, mood disorder, suicidal ideations, hallucinations   Objective: Vascular Examination: Capillary refill time immediate x 10 digits Dorsalis pedis and posterior tibial pulses 2/4 b/l No digital hair x 10 digits Skin temperature gradient WNL b/l  Dermatological Examination: Skin with normal turgor, texture and tone b/l  Toenails 1-5 b/l discolored, thick, dystrophic with subungual debris and pain with palpation to nailbeds due to thickness of nails.  No hyperkeratotic lesions.  No interdigital macerations noted.  No open wounds.  Musculoskeletal: Muscle strength 5/5 to all LE muscle groups  Hallux abductovalgus with bunion deformity bilaterally  Neurological: Sensation intact with 10 gram monofilament Due to cognition she is unable to perform the vibratory examination with the tuning fork.  Assessment: 1. Painful onychomycosis toenails 1-5 b/l    Plan: 1. Discussed onychomycosis and treatment options.  Literature dispensed on today. 2. Toenails 1-5 b/l were debrided in length and girth without iatrogenic bleeding. 3. Patient to continue soft, supportive shoe gear 4. Patient to report any pedal injuries to medical professional immediately. 5. Follow up 3 months. Patient/POA to call should there be a concern in the interim.

## 2018-02-26 ENCOUNTER — Telehealth: Payer: Self-pay | Admitting: *Deleted

## 2018-02-26 NOTE — Telephone Encounter (Signed)
[name] foster left vm at triage indicating pts BP has been elevated recently with the highest reading being 176/86. [name] is wanting to know if she is needing to restart her BP meds. pls advise

## 2018-02-26 NOTE — Telephone Encounter (Signed)
Will you please have this person give me additional blood pressure readings? I need more information.  Also clarify that she is not taking either amlodipine 5 mg and hydralazine 25 mg.

## 2018-02-27 NOTE — Telephone Encounter (Signed)
Thanks. Yes, have her restart her Amlodipine 5 mg tablets for now. Have her scheduled in 2 weeks for BP check and dementia.

## 2018-02-27 NOTE — Telephone Encounter (Signed)
Message left for Cathy Rose to return my call.

## 2018-02-27 NOTE — Telephone Encounter (Signed)
Spoken and notified [name] of Cathy Rose comments. Appointment on 03/17/2018

## 2018-02-27 NOTE — Telephone Encounter (Signed)
Spoken to [name]. She stated that patient is not taking anything at all for BP. She stated patient's BP before PT was 144/80 and after PT was 150/90, this was yesterday. This morning BP was 142/82 and afternoon 155/73. [name] stated with these high reading, should patient re-start. Also patient has been more agitated and [name] wanted to know if she should schedule an appointment for evaluation of increasing dementia.

## 2018-02-27 NOTE — Telephone Encounter (Signed)
Pt returned call to Chan. °

## 2018-03-17 ENCOUNTER — Ambulatory Visit (INDEPENDENT_AMBULATORY_CARE_PROVIDER_SITE_OTHER): Payer: Medicare HMO | Admitting: Primary Care

## 2018-03-17 ENCOUNTER — Encounter: Payer: Self-pay | Admitting: Primary Care

## 2018-03-17 DIAGNOSIS — F0390 Unspecified dementia without behavioral disturbance: Secondary | ICD-10-CM | POA: Diagnosis not present

## 2018-03-17 DIAGNOSIS — I10 Essential (primary) hypertension: Secondary | ICD-10-CM | POA: Diagnosis not present

## 2018-03-17 NOTE — Patient Instructions (Signed)
Continue Amlodipine 5 mg once daily for blood pressure.  Please update me as discussed if you notice increased agitation or anxiety.  It was a pleasure to see you today!

## 2018-03-17 NOTE — Assessment & Plan Note (Signed)
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 Discussed options for treatment including citalopram vs resuming memanitine. She declines any treatment for now and will continue to monitor.  Her niece will update if she changes her mind and at that point I recommend resuming low dose citalopram.

## 2018-03-17 NOTE — Assessment & Plan Note (Signed)
Stable in the office today, home readings improved. Continue amlodipine 5 mg daily. No refills needed at this time.

## 2018-03-17 NOTE — Progress Notes (Signed)
Subjective:    Patient ID: Cathy Rose, female    DOB: 1924-04-13, 85 y.o.   MRN: [id number]  HPI  Cathy Rose is a 85 year old female who presents today for follow up.  1) Essential Hypertension: Previously managed on Amlodipine 5 mg and hydralazine 25 mg. During her initial visit her blood pressure had been on the lower side on Amlodipine 5 mg alone (her niece discontinued her hydralazine previously) so her Amlodipine was discontinued.  Her niece called several weeks ago with reports of elevated blood pressure readings so she was instructed to resume Amlodipine 5 mg.   BP Readings from Last 3 Encounters:  03/17/18 124/74  01/22/18 122/71  11/20/17 114/70   Since then she's been taking her Amlodipine 5 mg daily. She's been getting home BP readings of 120's/70's.   2) Dementia: Diagnosed several years ago, once managed on citalopram 20 mg and memantine-donepezil 7-10 mg. Medications were removed in August 2019 due to recurrent falls and drowsiness. She had been doing well since.   Over the last several months her niece will notice that the patient gets "fidgety" in the evenings, will walk around the house picking up clothing. Short term memory has gotten worse. She is visited by physical therapy often and does well with their immediate direction but will quickly forget. The patient today denies feeling scared or anxious in the evening. Her niece endorses that she's sleeping throughout the night.   Review of Systems  Eyes: Negative for visual disturbance.  Respiratory: Negative for shortness of breath.   Cardiovascular: Negative for chest pain.  Neurological: Negative for dizziness and headaches.       Repetitive questioning.   Psychiatric/Behavioral:       Restlessness during the evening       Past Medical History:  Diagnosis Date  . Alzheimer's disease (HCC)   . Anxiety   . Dementia (HCC)   . Depression   . GERD (gastroesophageal reflux disease)   . Hallucinations     . Headache   . Heart beat abnormality   . Hypertension   . Kidney disease   . Osteoarthritis   . Stroke (HCC)   . TIA (transient ischemic attack)   . Vitamin B deficiency   . Vitamin D deficiency      Social History   Socioeconomic History  . Marital status: Legally Separated    Spouse name: Not on file  . Number of children: 0  . Years of education: HS  . Highest education level: Not on file  Occupational History  . Occupation: Retired  Engineer, production  . Financial resource strain: Not on file  . Food insecurity:    Worry: Not on file    Inability: Not on file  . Transportation needs:    Medical: Not on file    Non-medical: Not on file  Tobacco Use  . Smoking status: Never Smoker  . Smokeless tobacco: Never Used  Substance and Sexual Activity  . Alcohol use: No  . Drug use: No  . Sexual activity: Not on file  Lifestyle  . Physical activity:    Days per week: Not on file    Minutes per session: Not on file  . Stress: Not on file  Relationships  . Social connections:    Talks on phone: Not on file    Gets together: Not on file    Attends religious service: Not on file    Active member of club or organization: Not  on file    Attends meetings of clubs or organizations: Not on file    Relationship status: Not on file  . Intimate partner violence:    Fear of current or ex partner: Not on file    Emotionally abused: Not on file    Physically abused: Not on file    Forced sexual activity: Not on file  Other Topics Concern  . Not on file  Social History Narrative   Lives at home with her niece   Right-handed.   Occasional caffeine use.    No past surgical history on file.  Family History  Problem Relation Age of Onset  . Stroke Father   . Colon cancer Sister   . Kidney disease Sister     No Known Allergies  Current Outpatient Medications on File Prior to Visit  Medication Sig Dispense Refill  . acetaminophen (TYLENOL) 500 MG tablet Take 500 mg by  mouth every 6 (six) hours as needed.    Marland Kitchen amLODipine (NORVASC) 5 MG tablet Take 5 mg by mouth daily.   3  . polyethylene glycol powder (GLYCOLAX/MIRALAX) powder Take 1 Container by mouth as needed.      No current facility-administered medications on file prior to visit.     BP 124/74   Pulse 78   Temp 97.9 F (36.6 C) (Oral)   Ht 4' 8.25" (1.429 m)   Wt 133 lb 12 oz (60.7 kg)   SpO2 97%   BMI 29.72 kg/m    Objective:   Physical Exam  Constitutional: She appears well-nourished.  Neck: Neck supple.  Cardiovascular: Normal rate and regular rhythm.  Respiratory: Effort normal and breath sounds normal.  Neurological: She is alert.  Oriented to person and place, not time. Follows commands today.  Skin: Skin is warm and dry.  Psychiatric: She has a normal mood and affect.           Assessment & Plan:

## 2018-04-20 ENCOUNTER — Telehealth: Payer: Self-pay | Admitting: Student

## 2018-04-20 NOTE — Telephone Encounter (Signed)
Palliative NP spoke with niece [name] to follow up on patient. Appointment scheduled for 04/30/2018 at 10am.

## 2018-04-30 ENCOUNTER — Other Ambulatory Visit: Payer: Self-pay

## 2018-04-30 ENCOUNTER — Other Ambulatory Visit: Payer: Medicare HMO | Admitting: Student

## 2018-04-30 DIAGNOSIS — Z515 Encounter for palliative care: Secondary | ICD-10-CM

## 2018-04-30 NOTE — Progress Notes (Signed)
Therapist, nutritional Palliative Care Consult Note Telephone: [phone]  Fax: [phone]  PATIENT NAME: Cathy Rose DOB: Mar 12, 1924 MRN: [id number]  PRIMARY CARE PROVIDER:   Doreene Nest, NP  REFERRING PROVIDER:  Doreene Nest, NP [address] [place], [place] [address]  RESPONSIBLE PARTY: Niece, [name]  ASSESSMENT:Due to the COVID-19 crisis, this visit was done via telemedicine from my office and it was initiated and consent by this patient and or family. Method of communication: telephone. Discussion with both patient and niece; Cathy Rose was in pleasant mood. She was able to answer questions. Discussed ongoing goals of care. We discussed symptom management. We also discussed code status. NP will have copy of MOST form mailed to niece for review and will fill out on next visit. FAST Score is a 6D.    RECOMMENDATIONS and PLAN:  1. Code Status: Full Code; MOST form to be mailed to niece and will review on next visit. 2. Medical goals of therapy: Will focus on symptom management; monitor for changes and declines and make recommendations as needed. 3. Symptom management: agitation/sun-downing: redirection as needed; will monitor for worsening. Niece to start citalopram if worsens.  4. Discharge Planning: Cathy Rose will continue to reside at home with niece and hired caregiver. 5. Emotional/spiritual support: Discussed with patient and niece Cathy Rose. Continue to provide emotional and spiritual support.   Palliative SW Whitney notified to see if any recommendations on caregivers.   Palliative Care to continue to follow for ongoing discussions trajectory of chronic disease progression, medical goals of therapy, monitor for symptoms with management, and reduce ED and hospitalizations with recommendations.  Palliative Medicine to follow up with phone call in four weeks or sooner if needed.    I spent 20 minutes providing this consultation,  from  10:05am to 10:25am. More than 50% of the time in this consultation was spent coordinating communication.   HISTORY OF PRESENT ILLNESS:  Cathy Rose is a 85 y.o. female with multiple medical problems including Alzheimer's disease, chronic kidney disease, edema,  GERD, gait instability, renovascular hypertension, Vitamin b12 deficiency, Vitamin D deficiency, allergic rhinitis, neuropathy, anxiety and history of CVA. Palliative Care was asked to help address goals of care; follow up visit today via phone. Niece Cathy Rose reports patient doing much better since discharging from facility; Cathy Rose also reports doing well. Cathy Rose is currently residing at home with her niece Cathy Rose and a live in caregiver. No recent hospitalizations or emergency room visits. No recent falls reported; she does use walker for ambulation but requires reminders as she leaves walker at times. Patient does have urinary incontinence; she requires assistance with adl's such as bathing, dressing and grooming. Cathy Rose completed physical therapy with [facility] in February after receiving 6 visits. Her appetite is fair overall; "she eats what she likes." Most recent weight is 133 pounds. She does not drink as much water as she should per niece. She denies pain, shortness of breath, nausea, vomiting, constipation or diarrhea. She does experience some "sun-downing." This is improved on days where she is kept busy. Cathy Rose states she does have citalopram to start, if sun-downing worsens. She denies swelling to her legs. Cathy Rose states patient's current live in caregiver has put in a 30 day notice and she is seeking someone else; open to recommendations. Cathy Rose states that patient does have a Living Will; she is going to attempt to obtain copy from previous facility.  CODE STATUS: Full Code  PPS: 50% HOSPICE ELIGIBILITY/DIAGNOSIS: TBD  PAST MEDICAL HISTORY:  Past Medical History:  Diagnosis Date  . Alzheimer's disease (HCC)   . Anxiety   .  Dementia (HCC)   . Depression   . GERD (gastroesophageal reflux disease)   . Hallucinations   . Headache   . Heart beat abnormality   . Hypertension   . Kidney disease   . Osteoarthritis   . Stroke (HCC)   . TIA (transient ischemic attack)   . Vitamin B deficiency   . Vitamin D deficiency     SOCIAL HX:  Social History   Tobacco Use  . Smoking status: Never Smoker  . Smokeless tobacco: Never Used  Substance Use Topics  . Alcohol use: No    ALLERGIES: No Known Allergies   PERTINENT MEDICATIONS:  Outpatient Encounter Medications as of 04/30/2018  Medication Sig  . acetaminophen (TYLENOL) 500 MG tablet Take 500 mg by mouth every 6 (six) hours as needed.  Marland Kitchen amLODipine (NORVASC) 5 MG tablet Take 5 mg by mouth daily.   . polyethylene glycol powder (GLYCOLAX/MIRALAX) powder Take 1 Container by mouth as needed.    No facility-administered encounter medications on file as of 04/30/2018.     Luella Cook, NP

## 2018-05-29 ENCOUNTER — Telehealth: Payer: Self-pay

## 2018-05-29 NOTE — Telephone Encounter (Signed)
SW received message from [place], NP to contact [name], patient's niece.  SW contacted [name]. [name] said she wanted to discuss patient's care needs. [name] reports that patient has had a decline cognitively and is needing more prompting and hands-on assistance than a few weeks ago. [name] said she has had to help patient with dressing and even feeding recently. [name] expressed she is experiencing caregiver fatigue and is in need of a break. [name] reports that she has hired a caregiver to start on Monday and plans to help throughout the month of May. [name] is looking forward to this support and a chance to rest. [name] shared that she called a representative with patient's insurance - [employer] and was told that her insurance would cover 30 hours of in-home care a month with an Sport and exercise psychologist. [name] said she was told to contact [employer] and coordinate a referral with PCP. [name] is hopeful that this will workout and they can get additional help. SW used active and reflective listening, provided education on in-home care and provided supportive counseling. SW encouraged [name] to follow-up with any other questions or concerns. [name] was appreciative of phone call.

## 2018-06-01 ENCOUNTER — Other Ambulatory Visit: Payer: Medicare HMO | Admitting: Student

## 2018-06-01 ENCOUNTER — Other Ambulatory Visit: Payer: Self-pay

## 2018-06-01 DIAGNOSIS — Z515 Encounter for palliative care: Secondary | ICD-10-CM

## 2018-06-01 NOTE — Progress Notes (Signed)
[facility] Consult Note Telephone: [phone]  Fax: [phone]) [phone]  PATIENT NAME: Cathy RacerSusie Beatrice Rose DOB: 11/12/1924 MRN: [id number]  PRIMARY CARE PROVIDER:   Doreene Nestlark, Katherine K, NP  REFERRING PROVIDER:  Doreene Nestlark, Katherine K, NP [address] [place], [place] [id number]  RESPONSIBLE PARTY: Niece, [name]  ASSESSMENT: Due to the COVID-19 crisis, this virtual check-in visit was done via telephone from my office and it was initiated and consent by this patient and or family. Discussion with both patient and niece; Cathy Rose was in pleasant mood. She was able to answer questions. Discussed ongoing goals of care. We discussed symptom management. Niece expressed caregiver fatigue; provided active listening and support. NP suggested PACE program; Cathy Rose states patient would likely not adjust well to a program in a new surrounding. Niece Cathy Rose states she has printed off MOST form and will review. FAST Score is a 6D.     RECOMMENDATIONS and PLAN:  1. Code Status: Full Code; this is an ongoing discussion. 2. Medical goals of therapy: Will focus on symptom management; monitor for changes and declines and make recommendations as needed. 3. Symptom management: agitation/sun-downing: redirection as needed; will monitor for worsening. Niece to start citalopram if worsens. Weakness-provide support as needed; encourage use of walker with ambulation.  4. Discharge Planning: Cathy Rose will continue to reside at home with niece; Denzil Magnusonris is following up with list of caregivers.  5. Emotional/spiritual support: Discussed with patient and niece Cathy Rose. Continue to provide emotional and spiritual support.   Palliative Medicine will follow up via phone call in 6 weeks or sooner, if needed.   I spent 20 minutes providing this consultation,  from 2:30pm to 2:50pm. More than 50% of the time in this consultation was spent coordinating communication.   HISTORY OF PRESENT  ILLNESS:  Cathy Rose is a 85 y.o.  female with multiple medical problems including Alzheimer's disease, chronic kidney disease, edema,GERD, gait instability, renovascular hypertension,Vitamin b12 deficiency, Vitamin D deficiency,allergic rhinitis,neuropathy,anxiety and history of CVA. Palliative Care was asked to help address goals of care; follow up visit today via phone.Niece Cathy Rose reports patient doing well overall, she has acclimated well to being at home. Cathy Rose also reports doing well. Cathy Rose is currently residing at home with her niece Cathy Rose; Cathy Rose expresses some caregiver fatigue since private caregiver quit recently. No recent hospitalizations or emergency room visits. No recent falls reported. Patient does have urinary incontinence; she requires assistance with adl's such as bathing, dressing and grooming. Cathy Rose reports a good appetite. She does report some weakness with her legs. She denies swelling to her legs. She denies pain, shortness of breath, nausea, vomiting, constipation or diarrhea. She does experience some "sun-downing" per Cathy Rose. This is improved on days where she is kept busy. Cathy Rose states she does have citalopram to start, if sun-downing worsens. Cathy Rose has spoken with patient's insurance and was provided a list of recommendations. She is currently calling companies that can provide in home care.   CODE STATUS: Full Code    PPS: 50% HOSPICE ELIGIBILITY/DIAGNOSIS: TBD  PAST MEDICAL HISTORY:  Past Medical History:  Diagnosis Date  . Alzheimer's disease (HCC)   . Anxiety   . Dementia (HCC)   . Depression   . GERD (gastroesophageal reflux disease)   . Hallucinations   . Headache   . Heart beat abnormality   . Hypertension   . Kidney disease   . Osteoarthritis   . Stroke (HCC)   .  TIA (transient ischemic attack)   . Vitamin B deficiency   . Vitamin D deficiency     SOCIAL HX:  Social History   Tobacco Use  . Smoking status: Never Smoker  .  Smokeless tobacco: Never Used  Substance Use Topics  . Alcohol use: No    ALLERGIES: No Known Allergies   PERTINENT MEDICATIONS:  Outpatient Encounter Medications as of 06/01/2018  Medication Sig  . acetaminophen (TYLENOL) 500 MG tablet Take 500 mg by mouth every 6 (six) hours as needed.  Marland Kitchen amLODipine (NORVASC) 5 MG tablet Take 5 mg by mouth daily.   . polyethylene glycol powder (GLYCOLAX/MIRALAX) powder Take 1 Container by mouth as needed.    No facility-administered encounter medications on file as of 06/01/2018.     PHYSICAL EXAM:   Physical exam deferred.   Luella Cook, NP

## 2018-06-26 ENCOUNTER — Telehealth: Payer: Self-pay | Admitting: Student

## 2018-06-26 ENCOUNTER — Telehealth: Payer: Self-pay

## 2018-06-26 NOTE — Telephone Encounter (Signed)
SW was contacted by Denzil Magnuson, patient's niece, regarding patient's changes. [name] said that she was able to hire a "live-in" caregiver and she started a few weeks ago. [name] said she is relieved to have additional support but has noticed that the past two weeks, patient is not wanting to get out of bed or eat. SW and [name] discussed these changes. [name] said her concern is for depression or decline. SW and [name] processed her feelings around this, and [name] is open and aware but wants to provide the best care for patient that she can. SW also acknowledged the impact of a new caregiver. SW encouraged [name] to try to maintain a routine and establish normalcy. [name] agreed. [name] has also reached out to PCP and palliative care NP. SW provided emotional support, used active listening and provided education on care and safety.

## 2018-06-26 NOTE — Telephone Encounter (Signed)
442pm. NP returned call to niece Cathy Rose. She states she had called to provide an update as she will be going out of town. She states she has also called PCP to make aware of changes seen over the past two weeks. She reports patient not eating as well, not drinking as much; urine is dark in color. We discussed disease process, offering small meals/snacks throughout the day. Fluids/water are encouraged. No fever or other signs of infection reported. Patient is currently drinking one ensure nutritional supplement a day; Cathy Rose would like to try to prevent adding additional medications if possible. Patient has started taking citalopram in the past 3 weeks due to "sun-downing." Education provided on medication and disease process. Cathy Rose has a new caregiver with patient at night; patient is adjusting well to this caregiver. Cathy Rose has also found a grant through [employer] and someone is coming in to supplement care. No recent falls. Cathy Rose denies any pain, shortness of breath. She reports a good appetite, sleeping well. Support is provided; Cathy Rose is encouraged to call as needs arise.

## 2018-06-30 ENCOUNTER — Telehealth: Payer: Self-pay

## 2018-06-30 DIAGNOSIS — F419 Anxiety disorder, unspecified: Secondary | ICD-10-CM

## 2018-06-30 DIAGNOSIS — F0390 Unspecified dementia without behavioral disturbance: Secondary | ICD-10-CM

## 2018-06-30 MED ORDER — CITALOPRAM HYDROBROMIDE 20 MG PO TABS: 20.0000 mg | ORAL_TABLET | Freq: Every day | ORAL | 0 refills | Status: DC

## 2018-06-30 NOTE — Telephone Encounter (Signed)
Pt's niece called needing a rx for citalopram 20mg  1 a day. Said she had discussed it with Jae Dire. Her Sundowning is getting worse. Wonders if she needs something other than citalopram. They use [facility]

## 2018-06-30 NOTE — Telephone Encounter (Signed)
Please notify patient's niece that we will resume citalopram 20 mg daily, start with 1/2 tablet daily for 8 days, then increase to 1 tablet thereafter. It may take several weeks for this build up in her system. Unfortunately there is no quick fix. Have her schedule a virtual follow up visit with me in 6 weeks.

## 2018-07-01 NOTE — Telephone Encounter (Signed)
Spoken and notified patient of Kate Clark's comments. Patient verbalized understanding.  

## 2018-07-01 NOTE — Telephone Encounter (Signed)
OV on 08/12/2018

## 2018-07-24 ENCOUNTER — Telehealth: Payer: Self-pay | Admitting: Student

## 2018-07-24 NOTE — Telephone Encounter (Signed)
Palliative NP received call from niece [name]. She states patient is having some swelling to her legs and wants to know what she can take over the counter. She states patient does not drink enough water. Edema is non-pitting. We discussed patient drinking adequate fluids as well as adding lemon to her water. She is encouraged to elevate her legs as well while sitting or when in bed. She is encouraged to call with questions.

## 2018-08-12 ENCOUNTER — Ambulatory Visit (INDEPENDENT_AMBULATORY_CARE_PROVIDER_SITE_OTHER): Payer: Medicare HMO | Admitting: Primary Care

## 2018-08-12 VITALS — BP 122/76

## 2018-08-12 DIAGNOSIS — F0391 Unspecified dementia with behavioral disturbance: Secondary | ICD-10-CM | POA: Diagnosis not present

## 2018-08-12 DIAGNOSIS — I10 Essential (primary) hypertension: Secondary | ICD-10-CM | POA: Diagnosis not present

## 2018-08-12 MED ORDER — AMLODIPINE BESYLATE 5 MG PO TABS: 5.0000 mg | ORAL_TABLET | Freq: Every day | ORAL | 3 refills | Status: DC

## 2018-08-12 MED ORDER — MEMANTINE HCL 5 MG PO TABS: 5.0000 mg | ORAL_TABLET | Freq: Every day | ORAL | 0 refills | Status: DC

## 2018-08-12 NOTE — Assessment & Plan Note (Signed)
Symptoms seem like expected progression of dementia. Continued citalopram.  Added memantine 5 mg HS, can increase to BID if needed. Family will update in a few weeks. Return precautions provided.

## 2018-08-12 NOTE — Assessment & Plan Note (Signed)
Stable, refills sent to pharmacy. 

## 2018-08-12 NOTE — Progress Notes (Signed)
Subjective:    Patient ID: Cathy Rose, female    DOB: 07-06-1924, 85 y.o.   MRN: [id number]  HPI  Virtual Visit via Video Note  I connected with Sheryle Hail on 08/12/18 at 10:20 AM EDT by a video enabled telemedicine application and verified that I am speaking with the correct person using two identifiers.  Location: Patient: Home Provider: Office   I discussed the limitations of evaluation and management by telemedicine and the availability of in person appointments. The patient expressed understanding and agreed to proceed.  History of Present Illness:  Cathy Rose is a 85 year old female who presents today for follow up. Her family member is with her today who is providing information for HPI,.  1) Essential Hypertension: Currently managed on amlodipine 5 mg. She denies chest pain and is needing a refill today.  BP Readings from Last 3 Encounters:  08/12/18 122/76  03/17/18 124/74  01/22/18 122/71   2) Dementia: She was last evaluated for dementia in February 2020 with family reports of agitation, aggression, anxiety in the early evening. We discussed options for treatment for which she kindly declined at the time. She called back later and agreed to citalopram.  Currently managed on citalopram 20 mg daily and has been doing okay overall except that her symptoms have gradually regressed. Symptoms include difficulty dressing, more loss of short term memory, repetitive questioning, increased anxiety during the early afternoon/evening, increased agitation, sleeping throughout the night and until 12 pm the following day. no acute changes in symptoms.   Palliative care is involved but are not making house calls at this time due to Covid.    Observations/Objective:  Alert, answers some questions appropriately. Appears well, not sickly. No distress. Speaking in complete sentences.  Assessment and Plan:  See problem based charting.  Follow Up Instructions:   Continue citalopram 20 mg daily for anxiety.  Start memantine 5 mg tablets every evening at bedtime for agitation/dementia.  Please update me in a few weeks as discussed.  It was a pleasure to see you today! Allie Bossier, NP-C    I discussed the assessment and treatment plan with the patient. The patient was provided an opportunity to ask questions and all were answered. The patient agreed with the plan and demonstrated an understanding of the instructions.   The patient was advised to call back or seek an in-person evaluation if the symptoms worsen or if the condition fails to improve as anticipated.     Pleas Koch, NP    Review of Systems  Constitutional: Negative for fever.  Respiratory: Negative for shortness of breath.   Cardiovascular: Negative for chest pain.  Genitourinary: Negative for dysuria and frequency.  Psychiatric/Behavioral: Positive for agitation and behavioral problems. The patient is nervous/anxious.        Past Medical History:  Diagnosis Date  . Alzheimer's disease ([place])   . Anxiety   . Dementia ([place])   . Depression   . GERD (gastroesophageal reflux disease)   . Hallucinations   . Headache   . Heart beat abnormality   . Hypertension   . Kidney disease   . Osteoarthritis   . Stroke ([place])   . TIA (transient ischemic attack)   . Vitamin B deficiency   . Vitamin D deficiency      Social History   Socioeconomic History  . Marital status: Legally Separated    Spouse name: Not on file  . Number of children: 0  .  Years of education: HS  . Highest education level: Not on file  Occupational History  . Occupation: Retired  Engineer, productionocial Needs  . Financial resource strain: Not on file  . Food insecurity    Worry: Not on file    Inability: Not on file  . Transportation needs    Medical: Not on file    Non-medical: Not on file  Tobacco Use  . Smoking status: Never Smoker  . Smokeless tobacco: Never Used  Substance and Sexual Activity  .  Alcohol use: No  . Drug use: No  . Sexual activity: Not on file  Lifestyle  . Physical activity    Days per week: Not on file    Minutes per session: Not on file  . Stress: Not on file  Relationships  . Social Musicianconnections    Talks on phone: Not on file    Gets together: Not on file    Attends religious service: Not on file    Active member of club or organization: Not on file    Attends meetings of clubs or organizations: Not on file    Relationship status: Not on file  . Intimate partner violence    Fear of current or ex partner: Not on file    Emotionally abused: Not on file    Physically abused: Not on file    Forced sexual activity: Not on file  Other Topics Concern  . Not on file  Social History Narrative   Lives at home with her niece   Right-handed.   Occasional caffeine use.    No past surgical history on file.  Family History  Problem Relation Age of Onset  . Stroke Father   . Colon cancer Sister   . Kidney disease Sister     No Known Allergies  Current Outpatient Medications on File Prior to Visit  Medication Sig Dispense Refill  . acetaminophen (TYLENOL) 500 MG tablet Take 500 mg by mouth every 6 (six) hours as needed.    . citalopram (CELEXA) 20 MG tablet Take 1 tablet (20 mg total) by mouth daily. For anxiety. 90 tablet 0  . polyethylene glycol powder (GLYCOLAX/MIRALAX) powder Take 1 Container by mouth as needed.      No current facility-administered medications on file prior to visit.     BP 122/76    Objective:   Physical Exam  Constitutional: She appears well-nourished. She does not have a sickly appearance. She does not appear ill.  Respiratory: Breath sounds normal.  Neurological: She is alert.  Psychiatric: She has a normal mood and affect.           Assessment & Plan:

## 2018-08-12 NOTE — Patient Instructions (Signed)
Continue citalopram 20 mg daily for anxiety.  Start memantine 5 mg tablets every evening at bedtime for agitation/dementia.  Please update me in a few weeks as discussed.  It was a pleasure to see you today! Allie Bossier, NP-C

## 2018-08-19 ENCOUNTER — Telehealth: Payer: Self-pay

## 2018-08-19 ENCOUNTER — Telehealth: Payer: Self-pay | Admitting: Student

## 2018-08-19 NOTE — Telephone Encounter (Signed)
2:51pm Palliative NP spoke with patient's niece Marjory Lies. She provided update on patient's most recent visit with PCP. She also asks if there is someone that can read or provide companionship to patient. We talked about diversion type activities for patient. SW [place] notified. [name] also states that patient is on waiting list for [facility]. She is encouraged to call with questions.

## 2018-08-19 NOTE — Telephone Encounter (Signed)
Cal back to family member [name].  She was wanting to give update that patient has been sleeping a lot more and not eating or drinking as much recently. Patient as started on anxiety medications this past week, but symptoms started before medication addition.  [name] denies any signs of infection, fever. This RN requested SW from NP that has been following patient. NP spoke with patient.  [name] also advised that patient is on the waiting list for [facility]. No visit needs at this time per [facility]

## 2018-08-20 ENCOUNTER — Telehealth: Payer: Self-pay

## 2018-08-20 NOTE — Telephone Encounter (Signed)
Telephone call to [name] (patient's niece) to schedule home visit with patient/family to discuss patient's needs and questions about resources. [name] in agreement with visit on 08-24-18 at 11:00 AM.

## 2018-08-24 ENCOUNTER — Other Ambulatory Visit: Payer: Medicare HMO

## 2018-08-24 ENCOUNTER — Other Ambulatory Visit: Payer: Self-pay

## 2018-08-24 DIAGNOSIS — Z515 Encounter for palliative care: Secondary | ICD-10-CM

## 2018-08-24 NOTE — Progress Notes (Signed)
COMMUNITY PALLIATIVE CARE SW NOTE  PATIENT NAME: Cathy Rose DOB: [birth year] MRN: [id number]  PRIMARY CARE PROVIDER: Pleas Koch, NP  RESPONSIBLE PARTY:  Acct ID - Guarantor Home Phone Work Phone Relationship Acct Type  [id number] - Gonce,[phone]  Self P/F     [address] [place], [place] [address]     PLAN OF CARE and INTERVENTIONS:             1. GOALS OF CARE/ ADVANCE CARE PLANNING:  Goal is to keep patient at home as long as possible. 2. SOCIAL/EMOTIONAL/SPIRITUAL ASSESSMENT/ INTERVENTIONS:  SW met with patient and [name] (patient's niece). Patient was sitting on couch, smiling. Patient was engaged and would answer SW questions. Patient denies pain. Patient is doing well overall. [name] said patient has increased confusion in the evenings and at night, and [name] has talked with PCP about this and adjusted medications. [name] said that her main concern is keeping patient engaged. Patient enjoys television and likes to help do the dishes and the laundry. SW and [name] discussed adult daycare programs, [name] plans to call local programs. [name] did note concerns for COVID-19 and exposure if patient went to a program. [name] inquired about someone coming to the home to read to patient. SW to follow-up with any potential resource on this.  3. PATIENT/CAREGIVER EDUCATION/ COPING:  [name] noted caregiver fatigue from caring for patient and caring for [name]'s husband (he fell last week). [name] recognized that physical patient is doing well, but cognitively does struggle especially at night. 4. PERSONAL EMERGENCY PLAN:  Family will call 9-1-1 for emergencies. Due to COVID-19, patient is limiting contacts and remaining at home. 5. COMMUNITY RESOURCES COORDINATION/ HEALTH CARE NAVIGATION:  [name] coordinates all of patient's care. Patient has a family member that stays with her at night to help with care at night. [name] helps with patient care during the day. Patient has received grant funding to  cover an aide for 4 hours/per week. [name] is requesting more. SW left VM for VM and sent e-mail to Tammy with [employer]. SW also to request order for shower chair from PCP. 6. FINANCIAL/LEGAL CONCERNS/INTERVENTIONS:  [name] said that patient had Medicaid but lost her coverage when her husband passed away due to slight increase in income. SW encouraged [name] to talk with DSS about options.     SOCIAL HX:  Social History   Tobacco Use  . Smoking status: Never Smoker  . Smokeless tobacco: Never Used  Substance Use Topics  . Alcohol use: No    CODE STATUS:   Code Status: Prior  ADVANCED DIRECTIVES: N MOST FORM COMPLETE:  No. HOSPICE EDUCATION PROVIDED: Yes.  PPS: Patient is independent of most ADLs. Patient does need assistance with meal prep and bathing.   I spent 45 minutes with patient/family, from 11:15a-12:00p providing education, support and consultation.    Margaretmary Lombard, LCSW

## 2018-09-24 ENCOUNTER — Other Ambulatory Visit: Payer: Self-pay | Admitting: Primary Care

## 2018-09-24 DIAGNOSIS — F419 Anxiety disorder, unspecified: Secondary | ICD-10-CM

## 2018-09-24 DIAGNOSIS — F0390 Unspecified dementia without behavioral disturbance: Secondary | ICD-10-CM

## 2018-10-27 ENCOUNTER — Ambulatory Visit: Payer: Medicare HMO

## 2018-11-03 ENCOUNTER — Other Ambulatory Visit: Payer: Self-pay | Admitting: Primary Care

## 2018-11-03 DIAGNOSIS — F0391 Unspecified dementia with behavioral disturbance: Secondary | ICD-10-CM

## 2018-11-03 NOTE — Telephone Encounter (Signed)
How is she doing with aggravation since we added memantine (Namenda) 5 mg HS? Does her daughter wish to proceed with Rx?

## 2018-11-03 NOTE — Telephone Encounter (Signed)
Message left for patient's daughter to return my call.

## 2018-11-03 NOTE — Telephone Encounter (Signed)
Last prescribed on 08/12/2018 . Last appointment on 08/12/2018. No future appointment

## 2018-11-04 NOTE — Telephone Encounter (Signed)
Spoken to patient's niece. She stated that patient is doing better and would like a refill

## 2018-11-04 NOTE — Telephone Encounter (Signed)
Noted, refill sent to pharmacy. 

## 2018-12-23 ENCOUNTER — Telehealth: Payer: Self-pay | Admitting: Primary Care

## 2018-12-23 NOTE — Telephone Encounter (Signed)
Pt is needing a referral to [facility] for physical therapy on leg. She said you have seen pt for this before. She fell last night and went down on that knee and she is concerned she needs therapy. She said she is not walking normal. Please advise.   CB Marjory Lies [phone]

## 2018-12-23 NOTE — Telephone Encounter (Signed)
Please kindly notify patient's niece that we haven't seen her for this before but I'm happy to help. We can meet either in person or virtually as I have additional questions. Please schedule.

## 2018-12-29 ENCOUNTER — Other Ambulatory Visit: Payer: Self-pay

## 2018-12-29 ENCOUNTER — Ambulatory Visit (INDEPENDENT_AMBULATORY_CARE_PROVIDER_SITE_OTHER): Payer: Medicare HMO | Admitting: Primary Care

## 2018-12-29 DIAGNOSIS — R29898 Other symptoms and signs involving the musculoskeletal system: Secondary | ICD-10-CM | POA: Diagnosis not present

## 2018-12-29 NOTE — Patient Instructions (Signed)
You will be contacted regarding your referral to home health PT.  Please let us know if you have not been contacted within two weeks.   It was a pleasure to see you today! Allie Bossier, NP-C

## 2018-12-29 NOTE — Assessment & Plan Note (Signed)
Chronic lower extremity weakness, likely secondary to sedentary lifestyle during Covid-19. Appears well today. Agree to home health PT for endurance and strength. Referral placed.

## 2018-12-29 NOTE — Progress Notes (Signed)
Subjective:    Patient ID: Cathy Rose, female    DOB: 03-Feb-1924, 85 y.o.   MRN: [id number]  HPI  Virtual Visit via Video Note  I connected with Cathy Rose on 12/29/18 at 11:20 AM EST by a video enabled telemedicine application and verified that I am speaking with the correct person using two identifiers.  Location: Patient: Home Provider: Office   I discussed the limitations of evaluation and management by telemedicine and the availability of in person appointments. The patient expressed understanding and agreed to proceed.  History of Present Illness:  Cathy Rose is a 85 year old female with a history of dementia, hypertension, normochromic anemia, CVA who presents today with her niece requesting home health physical therapy.  She's having chronic bilateral lower extremity weakness over the last several months. She fell last week, landing on her buttocks when accidentally slipping off of her walker stool when attempting to sit. Her niece believes that her legs are weak as she's not walking much in the home. She would like to get PT involved to help with extremity strength and endurance.   Her niece denies acute confusion, urinary changes, further falls. She is followed by palliative care monthly, also monitored often by her niece and family. She has a CNA that sleeps with her nightly. The patient has no complaints.    Observations/Objective:  Alert and oriented. Appears well, not sickly. No distress. Speaking in complete sentences.   Assessment and Plan:  Chronic lower extremity weakness, likely secondary to sedentary lifestyle during Covid-19. Appears well today. Agree to home health PT for endurance and strength. Referral placed.  Follow Up Instructions:  You will be contacted regarding your referral to home health PT.  Please let us know if you have not been contacted within two weeks.   It was a pleasure to see you today! Allie Bossier, NP-C    I  discussed the assessment and treatment plan with the patient. The patient was provided an opportunity to ask questions and all were answered. The patient agreed with the plan and demonstrated an understanding of the instructions.   The patient was advised to call back or seek an in-person evaluation if the symptoms worsen or if the condition fails to improve as anticipated.    Pleas Koch, NP    Review of Systems  Genitourinary:       Denies urinary symptoms  Musculoskeletal: Negative for arthralgias and back pain.       Generalized lower extremity weakness  Neurological: Negative for headaches.  Psychiatric/Behavioral: Negative for confusion.       Past Medical History:  Diagnosis Date  . Alzheimer's disease ([place])   . Anxiety   . Dementia ([place])   . Depression   . GERD (gastroesophageal reflux disease)   . Hallucinations   . Headache   . Heart beat abnormality   . Hypertension   . Kidney disease   . Osteoarthritis   . Stroke ([place])   . TIA (transient ischemic attack)   . Vitamin B deficiency   . Vitamin D deficiency      Social History   Socioeconomic History  . Marital status: Legally Separated    Spouse name: Not on file  . Number of children: 0  . Years of education: HS  . Highest education level: Not on file  Occupational History  . Occupation: Retired  Scientific laboratory technician  . Financial resource strain: Not on file  . Food insecurity  Worry: Not on file    Inability: Not on file  . Transportation needs    Medical: Not on file    Non-medical: Not on file  Tobacco Use  . Smoking status: Never Smoker  . Smokeless tobacco: Never Used  Substance and Sexual Activity  . Alcohol use: No  . Drug use: No  . Sexual activity: Not on file  Lifestyle  . Physical activity    Days per week: Not on file    Minutes per session: Not on file  . Stress: Not on file  Relationships  . Social Musician on phone: Not on file    Gets together: Not on file     Attends religious service: Not on file    Active member of club or organization: Not on file    Attends meetings of clubs or organizations: Not on file    Relationship status: Not on file  . Intimate partner violence    Fear of current or ex partner: Not on file    Emotionally abused: Not on file    Physically abused: Not on file    Forced sexual activity: Not on file  Other Topics Concern  . Not on file  Social History Narrative   Lives at home with her niece   Right-handed.   Occasional caffeine use.    No past surgical history on file.  Family History  Problem Relation Age of Onset  . Stroke Father   . Colon cancer Sister   . Kidney disease Sister     No Known Allergies  Current Outpatient Medications on File Prior to Visit  Medication Sig Dispense Refill  . acetaminophen (TYLENOL) 500 MG tablet Take 500 mg by mouth every 6 (six) hours as needed.    Marland Kitchen amLODipine (NORVASC) 5 MG tablet Take 1 tablet (5 mg total) by mouth daily. For blood pressure. 90 tablet 3  . citalopram (CELEXA) 20 MG tablet TAKE 1 TABLET (20 MG TOTAL) BY MOUTH DAILY. FOR ANXIETY. 90 tablet 0  . memantine (NAMENDA) 5 MG tablet TAKE 1 TABLET (5 MG TOTAL) BY MOUTH AT BEDTIME. FOR AGGRAVATION. 90 tablet 1  . polyethylene glycol powder (GLYCOLAX/MIRALAX) powder Take 1 Container by mouth as needed.      No current facility-administered medications on file prior to visit.     There were no vitals taken for this visit.   Objective:   Physical Exam  Constitutional: She is oriented to person, place, and time. She appears well-nourished.  Respiratory: Effort normal.  Neurological: She is alert and oriented to person, place, and time.  Psychiatric: She has a normal mood and affect.           Assessment & Plan:

## 2019-01-14 DIAGNOSIS — I10 Essential (primary) hypertension: Secondary | ICD-10-CM | POA: Diagnosis not present

## 2019-01-14 DIAGNOSIS — M6281 Muscle weakness (generalized): Secondary | ICD-10-CM | POA: Diagnosis not present

## 2019-01-14 DIAGNOSIS — G309 Alzheimer's disease, unspecified: Secondary | ICD-10-CM

## 2019-01-14 DIAGNOSIS — R262 Difficulty in walking, not elsewhere classified: Secondary | ICD-10-CM

## 2019-01-15 ENCOUNTER — Telehealth: Payer: Self-pay | Admitting: Adult Health Nurse Practitioner

## 2019-01-15 NOTE — Telephone Encounter (Signed)
Called and spoke with niece, Marjory Lies.  Set up appointment for 01/19/2019 at 2pm [place] K. Olena Heckle NP

## 2019-01-16 ENCOUNTER — Other Ambulatory Visit: Payer: Self-pay | Admitting: Primary Care

## 2019-01-16 DIAGNOSIS — F419 Anxiety disorder, unspecified: Secondary | ICD-10-CM

## 2019-01-16 DIAGNOSIS — F0390 Unspecified dementia without behavioral disturbance: Secondary | ICD-10-CM

## 2019-02-04 ENCOUNTER — Other Ambulatory Visit: Payer: Medicare HMO | Admitting: Adult Health Nurse Practitioner

## 2019-02-04 ENCOUNTER — Other Ambulatory Visit: Payer: Self-pay

## 2019-02-04 DIAGNOSIS — Z515 Encounter for palliative care: Secondary | ICD-10-CM

## 2019-02-04 DIAGNOSIS — F0391 Unspecified dementia with behavioral disturbance: Secondary | ICD-10-CM

## 2019-02-04 NOTE — Progress Notes (Signed)
[facility] Consult Note Telephone: [phone]  Fax: [phone]  PATIENT NAME: Cathy Rose DOB: [birth year] MRN: [id number]  PRIMARY CARE PROVIDER:   Pleas Koch, NP  REFERRING PROVIDER:  Pleas Koch, NP [place],   [address]  RESPONSIBLE PARTY:   Marjory Lies, niece [phone]    RECOMMENDATIONS and PLAN:  1.  Advanced care planning.  Patient is DNR.  Filled out DNR, uploaded to [facility], and left original with niece.  Started going over MOST form.  Niece is legal guardian for the patient.  She wants limited interventions for the patient.  Did not fully fill out form.  Left form with provider signature.  Will go over and upload to [facility] at a future visit.  2.  Dementia.  FAST 6d.  Patient is able to ambulate with a walker.  Did have a fall a few weeks ago in which she did not lock her rollator before trying to sit on it.  The rollator moved and she slid to the floor.  She had no injury.  She requires assistance with ADLs.  Is able to feed herself but niece states that she is starting to eat less and is not drinking enough water.  Patient is encouraged to drink more water.  Patient is able to engage in conversation but is forgetful.  She can make her needs known and answer questions about how she is feeling.  She can follow commands.  Niece states that she is able to help make her bed and help fold laundry.  She has been wanting to stay in bed longer in the mornings but niece makes sure she gets out of bed everyday by 11am.  Discussed trying to keep her as mobile as possible but that she will eventually have a functional decline related to her dementia.  She takes citalopram 20 mg and namenda 5 mg daily for anxiety and agitation which has been helpful.  Continue supportive care.    Patient has not had any hospitalizations or infections since last visit.  Will reach out to niece in 4 weeks to monitor for  changes and if visit is needed.  Palliative will continue to monitor for symptom management/decline and make recommendations as needed.  Niece is wanting hospice services when needed as long as she can keep her aunt at home.     I spent 60 minutes providing this consultation,  from 9:00 to 10:00. More than 50% of the time in this consultation was spent coordinating communication.   HISTORY OF PRESENT ILLNESS:  Cathy Rose is a 85 y.o. year old female with multiple medical problems including Alzheimer's disease, chronic kidney disease, edema,GERD, gait instability, renovascular hypertension,Vitamin b12 deficiency, Vitamin D deficiency,allergic rhinitis,neuropathy,anxiety and history of CVA. Palliative Care was asked to help address goals of care.   CODE STATUS: DNR  PPS: 50% HOSPICE ELIGIBILITY/DIAGNOSIS: TBD  PHYSICAL EXAM:  BP 124/68  HR  66  O2 97% on RA General: NAD, frail appearing, thin Cardiovascular: regular rate and rhythm Pulmonary: lung sounds clear; normal respiratory effort Abdomen: soft, nontender, + bowel sounds GU: no suprapubic tenderness Extremities: no edema, no joint deformities Skin: no rashes;  Slight tenting noticed to left forearm Neurological: Weakness;  A&O to person and place; forgetfulness   PAST MEDICAL HISTORY:  Past Medical History:  Diagnosis Date  . Alzheimer's disease ([place])   . Anxiety   . Dementia ([place])   . Depression   .  GERD (gastroesophageal reflux disease)   . Hallucinations   . Headache   . Heart beat abnormality   . Hypertension   . Kidney disease   . Osteoarthritis   . Stroke ([facility])   . TIA (transient ischemic attack)   . Vitamin B deficiency   . Vitamin D deficiency     SOCIAL HX:  Social History   Tobacco Use  . Smoking status: Never Smoker  . Smokeless tobacco: Never Used  Substance Use Topics  . Alcohol use: No    ALLERGIES: No Known Allergies   PERTINENT MEDICATIONS:  Outpatient Encounter Medications as  of 02/04/2019  Medication Sig  . acetaminophen (TYLENOL) 500 MG tablet Take 500 mg by mouth every 6 (six) hours as needed.  Marland Kitchen amLODipine (NORVASC) 5 MG tablet Take 1 tablet (5 mg total) by mouth daily. For blood pressure.  . citalopram (CELEXA) 20 MG tablet TAKE 1 TABLET (20 MG TOTAL) BY MOUTH DAILY. FOR ANXIETY.  . memantine (NAMENDA) 5 MG tablet TAKE 1 TABLET (5 MG TOTAL) BY MOUTH AT BEDTIME. FOR AGGRAVATION.  Marland Kitchen polyethylene glycol powder (GLYCOLAX/MIRALAX) powder Take 1 Container by mouth as needed.    No facility-administered encounter medications on file as of 02/04/2019.     Zekiel Torian Marlena Clipper, NP

## 2019-03-05 ENCOUNTER — Other Ambulatory Visit: Payer: Self-pay | Admitting: Primary Care

## 2019-03-05 DIAGNOSIS — F419 Anxiety disorder, unspecified: Secondary | ICD-10-CM

## 2019-03-05 DIAGNOSIS — F0390 Unspecified dementia without behavioral disturbance: Secondary | ICD-10-CM

## 2019-03-05 DIAGNOSIS — F0391 Unspecified dementia with behavioral disturbance: Secondary | ICD-10-CM

## 2019-03-05 MED ORDER — CITALOPRAM HYDROBROMIDE 20 MG PO TABS: 20.0000 mg | ORAL_TABLET | Freq: Every day | ORAL | 1 refills | Status: DC

## 2019-03-05 MED ORDER — AMLODIPINE BESYLATE 5 MG PO TABS: 5.0000 mg | ORAL_TABLET | Freq: Every day | ORAL | 1 refills | Status: DC

## 2019-03-05 MED ORDER — MEMANTINE HCL 5 MG PO TABS: 5.0000 mg | ORAL_TABLET | Freq: Every day | ORAL | 1 refills | Status: DC

## 2019-03-11 ENCOUNTER — Telehealth: Payer: Self-pay

## 2019-03-11 NOTE — Telephone Encounter (Signed)
[facility] pharmacy left v/m requesting clarification of directions for memantine 5 mg. No reference # left.

## 2019-03-12 NOTE — Telephone Encounter (Signed)
Spoken to pharmacy tech and this has been confirm.

## 2019-05-20 ENCOUNTER — Encounter: Payer: Self-pay | Admitting: Family Medicine

## 2019-05-20 ENCOUNTER — Ambulatory Visit (INDEPENDENT_AMBULATORY_CARE_PROVIDER_SITE_OTHER): Payer: Medicare HMO | Admitting: Family Medicine

## 2019-05-20 ENCOUNTER — Other Ambulatory Visit: Payer: Self-pay

## 2019-05-20 VITALS — BP 140/100 | HR 76 | Temp 97.7°F | Ht <= 58 in | Wt 137.2 lb

## 2019-05-20 DIAGNOSIS — M170 Bilateral primary osteoarthritis of knee: Secondary | ICD-10-CM

## 2019-05-20 MED ORDER — METHYLPREDNISOLONE ACETATE 40 MG/ML IJ SUSP
80.0000 mg | Freq: Once | INTRAMUSCULAR | Status: AC
  Administered 2019-05-20: 80 mg via INTRA_ARTICULAR

## 2019-05-20 NOTE — Progress Notes (Signed)
Cathy Rose  Primary Care and Sports [facility] at [facility] [address] [place], [address]  Phone: [phone]  FAX: [phone]  Cathy Rose  MRN [id number]  Date of Birth: 1924/09/06  Date: 05/20/2019  PCP: Doreene Nest, NP  Referral: Doreene Nest, NP  Chief Complaint  Patient presents with  . Knee Pain    Bilateral but left is the worse    This visit occurred during the SARS-CoV-2 public health emergency.  Safety protocols were in place, including screening questions prior to the visit, additional usage of staff PPE, and extensive cleaning of exam room while observing appropriate contact time as indicated for disinfecting solutions.   Subjective:   Cathy Rose is a 86 y.o. very pleasant Rose patient with Body mass index is 30.5 kg/m. who presents with the following:  She is a pleasantly demented 86 year old and she is here with her niece today who provides additional history.  She does have bilateral knee pain, but her left knee is worse than the left.  They recently did a round of physical therapy.  They have also tried some topical Voltaren gel.  None of these seem to help all that much.  She is able take some Tylenol, and that has not helped all that much either.  She does sometimes use her walker.  They have outfitted her home with appropriate rails etc.  B knee OA. Fell at some time. Once about a year ago  Home PT did not help.    Review of Systems is noted in the HPI, as appropriate   Objective:   BP (!) 140/100   Pulse 76   Temp 97.7 F (36.5 C) (Temporal)   Ht 4' 8.25" (1.429 m)   Wt 137 lb 4 oz (62.3 kg)   SpO2 98%   BMI 30.50 kg/m   GEN: No acute distress; alert,appropriate. PULM: Breathing comfortably in no respiratory distress PSYCH: Normally interactive.   She was examined in her chair given that she could not get up to the  examination table.  She does have some on the left side medial and lateral joint line tenderness.  She also has some significant crepitus.  Extension appears to be full in flexion to approximately 110 degrees while seated.  Stable to varus and valgus stress.  ACL and PCL appear intact.  Radiology: No results found.  Assessment and Plan:     ICD-10-CM   1. Primary osteoarthritis of knees, bilateral  M17.0 methylPREDNISolone acetate (DEPO-MEDROL) injection 80 mg   Total encounter time: 30 minutes. On the day of the patient encounter, this can include review of prior records, labs, and imaging.  Additional time can include counselling, consultation with peer MD in person or by telephone.  This also includes independent review of Radiology.  Almost certainly arthritis.  Continue with conservative management.  I am going to give the patient an injection of some steroids into her left knee.  She is currently on palliative care, and I think that this will make her more comfortable.  Additional time spent in discussing case with the patient's niece as well as on chart review.  Aspiration/Injection Procedure Note Cathy Rose 03/22/1924 Date of procedure: 05/20/2019  Procedure: Large Joint Aspiration / Injection of Knee, L Indications: Pain  Procedure Details Patient verbally consented to procedure. Risks (including potential rare risk of infection), benefits, and alternatives explained. Sterilely prepped  with Chloraprep. Ethyl cholride used for anesthesia. 8 cc Lidocaine 1% mixed with 2 mL Depo-Medrol 40 mg injected using the anteromedial approach without difficulty. No complications with procedure and tolerated well. Patient had decreased pain post-injection. Medication: 2 mL of Depo-Medrol 40 mg, equaling Depo-Medrol 80 mg total   Follow-up: No follow-ups on file.  Meds ordered this encounter  Medications  . methylPREDNISolone acetate (DEPO-MEDROL) injection 80 mg   Medications  Discontinued During This Encounter  Medication Reason  . polyethylene glycol powder (GLYCOLAX/MIRALAX) powder Completed Course  . acetaminophen (TYLENOL) 500 MG tablet Change in therapy   No orders of the defined types were placed in this encounter.   Signed,  Maud Deed. Johnthomas Lader, MD   Outpatient Encounter Medications as of 05/20/2019  Medication Sig  . amLODipine (NORVASC) 5 MG tablet Take 1 tablet (5 mg total) by mouth daily. For blood pressure.  . citalopram (CELEXA) 20 MG tablet Take 1 tablet (20 mg total) by mouth daily. For anxiety.  Marland Kitchen ibuprofen (ADVIL) 200 MG tablet Take 200 mg by mouth every 4 (four) hours as needed.  . memantine (NAMENDA) 5 MG tablet Take 1 tablet (5 mg total) by mouth at bedtime. For aggravation.  . [DISCONTINUED] acetaminophen (TYLENOL) 500 MG tablet Take 500 mg by mouth every 6 (six) hours as needed.  . [DISCONTINUED] polyethylene glycol powder (GLYCOLAX/MIRALAX) powder Take 1 Container by mouth as needed.   . [EXPIRED] methylPREDNISolone acetate (DEPO-MEDROL) injection 80 mg    No facility-administered encounter medications on file as of 05/20/2019.

## 2019-05-22 ENCOUNTER — Other Ambulatory Visit: Payer: Self-pay | Admitting: Primary Care

## 2019-05-22 DIAGNOSIS — F0391 Unspecified dementia with behavioral disturbance: Secondary | ICD-10-CM

## 2019-06-15 ENCOUNTER — Telehealth: Payer: Self-pay | Admitting: Adult Health Nurse Practitioner

## 2019-06-15 NOTE — Telephone Encounter (Signed)
Spoke with daughter about patient moving in with her and to get new address.  New address is [address] [place], [place] [address].  Set up appointment for 06/24/19 at 1pm.  Let palliative admin now of address change Amy K. Garner Nash NP

## 2019-06-24 ENCOUNTER — Other Ambulatory Visit: Payer: Self-pay

## 2019-06-24 ENCOUNTER — Other Ambulatory Visit: Payer: Medicare HMO | Admitting: Adult Health Nurse Practitioner

## 2019-06-24 DIAGNOSIS — Z515 Encounter for palliative care: Secondary | ICD-10-CM

## 2019-06-24 DIAGNOSIS — F0391 Unspecified dementia with behavioral disturbance: Secondary | ICD-10-CM

## 2019-06-24 NOTE — Progress Notes (Signed)
Therapist, nutritional Palliative Care Consult Note Telephone: [phone]  Fax: [phone]  PATIENT NAME: Cathy Rose DOB: 04/03/1924 MRN: [id number]  PRIMARY CARE PROVIDER:   Doreene Nest, NP  REFERRING PROVIDER:  Doreene Nest, NP [address] [place],  [place] [address]  RESPONSIBLE PARTY:   [name], niece [phone]    RECOMMENDATIONS and PLAN:  1.  Advanced care planning.  Patient is DNR.   2.  Dementia.  FAST 6d.  Patient is able to ambulate with a walker.  She requires assistance with ADLs.  Is able to feed herself.  Patient is able to engage in conversation but is forgetful.  She can make her needs known and answer questions about how she is feeling.  She can follow commands.  Niece states that she is able to help make her bed and help fold laundry.  She takes citalopram 20 mg and namenda 5 mg daily for anxiety and agitation which has been helpful.  Continue supportive care at home.  3.  Pain.  Patient has pain in bilateral knees related to arthritis.  She has more pain in the left knee than the right.  Niece states that Voltaren gel, Tylenol, heat treatment does not seem to help with the pain.  Niece states that she is also had physical therapy which did not seem to help with the knee pain.  She has recently been seen by Ortho and was given knee injections.  Patient states that she does not notice a difference.  Pain does not seem to interfere with her getting up and walking and her normal activities.  Have encouraged to continue walking to stay mobile.  Niece does not want to try anything stronger for her pain as while she was at an assisted living facility she was on more medications and stronger medications and this just kept her sleepy all the time.  As the pain does not interfere with her activities and does not seem to be causing her any distress at least not today, will continue monitoring and will make adjustments to pain regimen  as needed  4.  Blood pressure.  Patient has been having some elevated blood pressure readings and niece states that her amlodipine was increased from 5 mg to 10 mg.  Today her blood pressure is 134/72.  Knee states that she only gives her the 5 mg most days as she checks her blood pressure prior to giving it.  States that she only gives her 10 mg if her blood pressure is elevated.  Niece does state that it only seems to get elevated when she eats things with too much salt.  Able to keep her blood pressure down by controlling her diet.  Continue monitoring blood pressure at home, watching salt intake, and giving 10 mg of amlodipine when needed for higher blood pressure.  Denies shortness of breath, cough, chest pain, headaches, dizziness, dysuria, hematuria, nausea, diarrhea, constipation.  Palliative will continue to monitor for symptom management/decline and make recommendations as needed.    Will call in 2 months to see if follow-up visit is needed.   I spent 50 minutes providing this consultation,  from 1:00 to 1:50 including time spent with patient/family, chart review, provider coordination, documentation. More than 50% of the time in this consultation was spent coordinating communication.   HISTORY OF PRESENT ILLNESS:  Cathy Rose is a 86 y.o. year old female with multiple medical problems including Alzheimer's disease, chronic kidney  disease, edema,GERD, gait instability, renovascular hypertension,Vitamin b12 deficiency, Vitamin D deficiency,allergic rhinitis,neuropathy,anxiety and history of CVA. Palliative Care was asked to help address goals of care.   CODE STATUS: DNR  PPS: 50% HOSPICE ELIGIBILITY/DIAGNOSIS: TBD  PHYSICAL EXAM:  BP 134/72  HR  62  O2 99% on RA General: NAD, frail appearing, thin Cardiovascular: regular rate and rhythm Pulmonary: lung sounds clear; normal respiratory effort Abdomen: soft, nontender, + bowel sounds GU: no suprapubic  tenderness Extremities: trace edema, no joint deformities Skin: no rashes on exposed skin Neurological: Weakness;  A&O to person and place    PAST MEDICAL HISTORY:  Past Medical History:  Diagnosis Date  . Alzheimer's disease ([place])   . Anxiety   . Dementia ([place])   . Depression   . GERD (gastroesophageal reflux disease)   . Hallucinations   . Headache   . Heart beat abnormality   . Hypertension   . Kidney disease   . Osteoarthritis   . Stroke ([place])   . TIA (transient ischemic attack)   . Vitamin B deficiency   . Vitamin D deficiency     SOCIAL HX:  Social History   Tobacco Use  . Smoking status: Never Smoker  . Smokeless tobacco: Never Used  Substance Use Topics  . Alcohol use: No    ALLERGIES: No Known Allergies   PERTINENT MEDICATIONS:  Outpatient Encounter Medications as of 06/24/2019  Medication Sig  . amLODipine (NORVASC) 5 MG tablet Take 1 tablet (5 mg total) by mouth daily. For blood pressure.  . citalopram (CELEXA) 20 MG tablet Take 1 tablet (20 mg total) by mouth daily. For anxiety.  Marland Kitchen ibuprofen (ADVIL) 200 MG tablet Take 200 mg by mouth every 4 (four) hours as needed.  . memantine (NAMENDA) 5 MG tablet Take 1 tablet (5 mg total) by mouth at bedtime. For aggravation.   No facility-administered encounter medications on file as of 06/24/2019.     Rome Echavarria Jenetta Downer, NP

## 2019-07-22 ENCOUNTER — Telehealth: Payer: Self-pay

## 2019-07-22 NOTE — Telephone Encounter (Signed)
[facility] Day - Client Nonclinical Telephone Record AccessNurse Client [facility] [place] Day - Client Client Site [facility] - Day Physician [facility] - PHYSICIAN, Crissie Figures- MD Contact Type Call Who Is Calling Patient / Member / Family / Caregiver Caller Name [name] Caller Phone Number [phone] Patient Name Cathy Rose Patient DOB 10-21-1924 Call Type Message Only Information Provided Reason for Call Request to Schedule Office Appointment Initial Comment Caller states that she would like to schedule an appt to have this patients knee looked at as she has a spot on her knee Disp. Time Disposition Final User 07/22/2019 11:14:03 AM General Information Provided Yes Joselyn Glassman Call Closed By: Joselyn Glassman Transaction Date/Time: 07/22/2019 11:08:49 AM (ET)

## 2019-07-22 NOTE — Telephone Encounter (Signed)
Per appt notes pt already has an appt scheduled with Dr Patsy Lager on 07/23/19 at 8 AM.

## 2019-07-23 ENCOUNTER — Encounter: Payer: Self-pay | Admitting: Family Medicine

## 2019-07-23 ENCOUNTER — Ambulatory Visit (INDEPENDENT_AMBULATORY_CARE_PROVIDER_SITE_OTHER): Payer: Medicare HMO | Admitting: Family Medicine

## 2019-07-23 ENCOUNTER — Other Ambulatory Visit: Payer: Self-pay

## 2019-07-23 VITALS — BP 140/82 | HR 67 | Temp 97.7°F | Ht <= 58 in | Wt 137.5 lb

## 2019-07-23 DIAGNOSIS — R600 Localized edema: Secondary | ICD-10-CM

## 2019-07-23 DIAGNOSIS — I10 Essential (primary) hypertension: Secondary | ICD-10-CM

## 2019-07-23 DIAGNOSIS — M1712 Unilateral primary osteoarthritis, left knee: Secondary | ICD-10-CM

## 2019-07-23 NOTE — Progress Notes (Signed)
Cathy Donahoe T. Jamason Peckham, MD, CAQ Sports Medicine  Primary Care and Sports Medicine [facility] at [facility] [address] [place], [address]  Phone: [phone]  FAX: [phone]  Cathy Rose - 86 y.o. female  MRN [id number]  Date of Birth: 1924-09-17  Date: 07/23/2019  PCP: Doreene Nest, NP  Referral: Doreene Nest, NP  Chief Complaint  Patient presents with  . Joint Swelling    Left Knee  . Knot on Left Wrist    This visit occurred during the SARS-CoV-2 public health emergency.  Safety protocols were in place, including screening questions prior to the visit, additional usage of staff PPE, and extensive cleaning of exam room while observing appropriate contact time as indicated for disinfecting solutions.   Subjective:   Cathy Rose is a 86 y.o. very pleasant female patient with Body mass index is 30.55 kg/m. who presents with the following:  L knee OA / pain.   L wrist swelling:  Ganglion  She is here with another one of her other relatives, and she has had some catching and popping in her left knee.  She has had some intermittent effusions and pain, but today she is asymptomatic and has no pain.  She does have some osteoarthritis, but aside from this does not have any particular knee pathology or history of prior interventions or fractures.  She is not had any symptomatic giving way, and she does not use any assistive devices generally to ambulate.  She is also been intermittently hypotensive.  Intermittently faint.  Review of Systems is noted in the HPI, as appropriate   Objective:   BP 140/82   Pulse 67   Temp 97.7 F (36.5 C) (Temporal)   Ht 4' 8.25" (1.429 m)   Wt 137 lb 8 oz (62.4 kg)   SpO2 97%   BMI 30.55 kg/m    GEN: No acute distress; alert,appropriate. PULM: Breathing comfortably in no respiratory distress PSYCH: Normally interactive.    Left knee: Full extension.  Flexion to 115 degrees.   Mild effusion.  Stable to varus and valgus stress.  ACL and PCL are intact.  She does have some significant crepitus with motion, but this does not cause much significant pain, and with McMurray's there is some catching, but again this is not painful.  Other provocative movers are also nontender.  Radiology: No results found.  Assessment and Plan:     ICD-10-CM   1. Primary osteoarthritis of left knee  M17.12   2. Essential hypertension  I10   3. Bilateral leg edema  R60.0    Arthritis and knee pain, asymptomatic in the office.  I reviewed with him some conservative measures that she can try.  She also has some intermittent low pressures and orthostasis, so I am going to have her stop her amlodipine.  Patient Instructions  Stop the Amlodipine  Check blood pressure daily for now, and send a Mychart message to Mayra Reel with the readings after 2 weeks.  OSTEOARTHRITIS: Over the counter  Tylenol: 2 tablets up to 3-4 times a day  Supplements: Tart cherry juice have good scientific evidence  Weight loss will always take stress off of the joints and back    Follow-up: No follow-ups on file.  No orders of the defined types were placed in this encounter.  Medications Discontinued During This Encounter  Medication Reason  . amLODipine (NORVASC) 5 MG tablet    No orders of the defined types  were placed in this encounter.   Signed,  Maud Deed. Amonte Brookover, MD   Outpatient Encounter Medications as of 07/23/2019  Medication Sig  . citalopram (CELEXA) 20 MG tablet Take 1 tablet (20 mg total) by mouth daily. For anxiety.  Marland Kitchen ibuprofen (ADVIL) 200 MG tablet Take 200 mg by mouth every 4 (four) hours as needed.  . memantine (NAMENDA) 5 MG tablet Take 1 tablet (5 mg total) by mouth at bedtime. For aggravation.  . [DISCONTINUED] amLODipine (NORVASC) 5 MG tablet Take 1 tablet (5 mg total) by mouth daily. For blood pressure.   No facility-administered encounter medications on file as of  07/23/2019.

## 2019-07-23 NOTE — Patient Instructions (Addendum)
Stop the Amlodipine  Check blood pressure daily for now, and send a Mychart message to Mayra Reel with the readings after 2 weeks.  OSTEOARTHRITIS: Over the counter  Tylenol: 2 tablets up to 3-4 times a day  Supplements: Tart cherry juice have good scientific evidence  Weight loss will always take stress off of the joints and back

## 2019-07-31 ENCOUNTER — Other Ambulatory Visit: Payer: Self-pay | Admitting: Primary Care

## 2019-07-31 DIAGNOSIS — F0390 Unspecified dementia without behavioral disturbance: Secondary | ICD-10-CM

## 2019-07-31 DIAGNOSIS — F419 Anxiety disorder, unspecified: Secondary | ICD-10-CM

## 2019-09-05 DIAGNOSIS — R2689 Other abnormalities of gait and mobility: Secondary | ICD-10-CM | POA: Diagnosis not present

## 2019-09-05 DIAGNOSIS — G309 Alzheimer's disease, unspecified: Secondary | ICD-10-CM | POA: Diagnosis not present

## 2019-09-06 ENCOUNTER — Ambulatory Visit: Payer: Medicare HMO | Admitting: Podiatry

## 2019-09-06 ENCOUNTER — Encounter: Payer: Self-pay | Admitting: Podiatry

## 2019-09-06 ENCOUNTER — Other Ambulatory Visit: Payer: Self-pay

## 2019-09-06 DIAGNOSIS — L608 Other nail disorders: Secondary | ICD-10-CM

## 2019-09-06 DIAGNOSIS — M79675 Pain in left toe(s): Secondary | ICD-10-CM | POA: Diagnosis not present

## 2019-09-06 DIAGNOSIS — M79674 Pain in right toe(s): Secondary | ICD-10-CM | POA: Diagnosis not present

## 2019-09-06 DIAGNOSIS — B351 Tinea unguium: Secondary | ICD-10-CM | POA: Diagnosis not present

## 2019-09-06 NOTE — Progress Notes (Signed)
This patient returns to my office for at risk foot care.  This patient requires this care by a professional since this patient will be at risk due to having dementia and kidney disease.   This patient is unable to cut nails herself since the patient cannot reach her nails.These nails are painful walking and wearing shoes.  This patient presents for at risk foot care today. Patient is brought to the office by her niece.  General Appearance  Alert, conversant and in no acute stress.  Vascular  Dorsalis pedis and posterior tibial  pulses are weakly  palpable  bilaterally.  Capillary return is within normal limits  bilaterally. Temperature is within normal limits  bilaterally.  Neurologic  Senn-Weinstein monofilament wire test within normal limits  bilaterally. Muscle power within normal limits bilaterally.  Nails Thick disfigured discolored nails with subungual debris  from hallux to fifth toes bilaterally. No evidence of bacterial infection or drainage bilaterally. Pincer nails  B/L.  Orthopedic  No limitations of motion  feet .  No crepitus or effusions noted.  No bony pathology or digital deformities noted. HAV  B/L.  Skin  normotropic skin with no porokeratosis noted bilaterally.  No signs of infections or ulcers noted.     Onychomycosis  Pain in right toes  Pain in left toes  Consent was obtained for treatment procedures.   Mechanical debridement of nails 1-5  bilaterally performed with a nail nipper.  Filed with dremel without incident.    Return office visit   3 months                  Told patient to return for periodic foot care and evaluation due to potential at risk complications.   Helane Gunther DPM

## 2019-09-09 ENCOUNTER — Other Ambulatory Visit: Payer: Self-pay | Admitting: Primary Care

## 2019-09-17 ENCOUNTER — Ambulatory Visit: Payer: Medicare HMO

## 2019-09-22 ENCOUNTER — Other Ambulatory Visit: Payer: Self-pay

## 2019-09-22 ENCOUNTER — Ambulatory Visit (INDEPENDENT_AMBULATORY_CARE_PROVIDER_SITE_OTHER): Payer: Medicare HMO | Admitting: Primary Care

## 2019-09-22 ENCOUNTER — Encounter: Payer: Self-pay | Admitting: Primary Care

## 2019-09-22 VITALS — BP 140/82 | HR 70 | Temp 96.9°F | Ht <= 58 in | Wt 135.5 lb

## 2019-09-22 DIAGNOSIS — Z0000 Encounter for general adult medical examination without abnormal findings: Secondary | ICD-10-CM | POA: Diagnosis not present

## 2019-09-22 DIAGNOSIS — F0390 Unspecified dementia without behavioral disturbance: Secondary | ICD-10-CM

## 2019-09-22 DIAGNOSIS — M1712 Unilateral primary osteoarthritis, left knee: Secondary | ICD-10-CM | POA: Diagnosis not present

## 2019-09-22 DIAGNOSIS — Z9181 History of falling: Secondary | ICD-10-CM | POA: Diagnosis not present

## 2019-09-22 DIAGNOSIS — F419 Anxiety disorder, unspecified: Secondary | ICD-10-CM | POA: Diagnosis not present

## 2019-09-22 DIAGNOSIS — I10 Essential (primary) hypertension: Secondary | ICD-10-CM | POA: Diagnosis not present

## 2019-09-22 DIAGNOSIS — M7989 Other specified soft tissue disorders: Secondary | ICD-10-CM | POA: Diagnosis not present

## 2019-09-22 DIAGNOSIS — R29898 Other symptoms and signs involving the musculoskeletal system: Secondary | ICD-10-CM

## 2019-09-22 LAB — COMPREHENSIVE METABOLIC PANEL
ALT: 7 U/L (ref 0–35)
AST: 11 U/L (ref 0–37)
Albumin: 4.3 g/dL (ref 3.5–5.2)
Alkaline Phosphatase: 86 U/L (ref 39–117)
BUN: 15 mg/dL (ref 6–23)
CO2: 31 mEq/L (ref 19–32)
Calcium: 9.5 mg/dL (ref 8.4–10.5)
Chloride: 103 mEq/L (ref 96–112)
Creatinine, Ser: 1.06 mg/dL (ref 0.40–1.20)
GFR: 58.51 mL/min — ABNORMAL LOW (ref >60.00)
Glucose, Bld: 68 mg/dL — ABNORMAL LOW (ref 70–99)
Potassium: 4.1 mEq/L (ref 3.5–5.1)
Sodium: 139 mEq/L (ref 135–145)
Total Bilirubin: 0.5 mg/dL (ref 0.2–1.2)
Total Protein: 6.7 g/dL (ref 6.0–8.3)

## 2019-09-22 LAB — LIPID PANEL
Cholesterol: 171 mg/dL (ref 0–200)
HDL: 54.3 mg/dL (ref >39.00)
LDL Cholesterol: 96 mg/dL (ref 0–99)
NonHDL: 117.09
Total CHOL/HDL Ratio: 3
Triglycerides: 105 mg/dL (ref 0.0–149.0)
VLDL: 21 mg/dL (ref 0.0–40.0)

## 2019-09-22 LAB — CBC
HCT: 39 % (ref 36.0–46.0)
Hemoglobin: 13.1 g/dL (ref 12.0–15.0)
MCHC: 33.7 g/dL (ref 30.0–36.0)
MCV: 98 fl (ref 78.0–100.0)
Platelets: 202 10*3/uL (ref 150.0–400.0)
RBC: 3.98 Mil/uL (ref 3.87–5.11)
RDW: 13.8 % (ref 11.5–15.5)
WBC: 4.9 10*3/uL (ref 4.0–10.5)

## 2019-09-22 MED ORDER — CITALOPRAM HYDROBROMIDE 20 MG PO TABS: ORAL_TABLET | ORAL | 3 refills | Status: AC

## 2019-09-22 MED ORDER — AMLODIPINE BESYLATE 5 MG PO TABS: ORAL_TABLET | ORAL | 3 refills | Status: AC

## 2019-09-22 MED ORDER — DICLOFENAC SODIUM 75 MG PO TBEC: 75.0000 mg | DELAYED_RELEASE_TABLET | Freq: Two times a day (BID) | ORAL | 0 refills | Status: DC

## 2019-09-22 NOTE — Assessment & Plan Note (Signed)
No longer on Namenda, doing well on citalopram alone for anxiety at night. Continue to monitor.

## 2019-09-22 NOTE — Assessment & Plan Note (Signed)
Overall stable today, her niece is giving her amlodipine 2 times weekly if BP elevates. She is closely watching her BP. Continue current regimen.  CMP pending.

## 2019-09-22 NOTE — Assessment & Plan Note (Signed)
Her niece will update [place] regarding her vaccinations. No mammogram or colonoscopy due to age. Encouraged regular activity and a healthy diet.  Exam today stable. Labs pending.

## 2019-09-22 NOTE — Patient Instructions (Signed)
Stop by the lab prior to leaving today. I will notify you of your results once received.   You will be contacted regarding your ultrasound and home health physical therapy.  Please let us know if you have not been contacted within two weeks.   Please provide me with her vaccination dates.  You can try diclofenac medication for pain and inflammation. Take 1 tablet twice daily with food.   Consider trying Pepcid 20 mg at bedtime for her cough. It may take several weeks to improve.  It was a pleasure to see you today!

## 2019-09-22 NOTE — Assessment & Plan Note (Signed)
Chronic, suspect this to be contributing to her left lower extremity swelling. She does lead a sedentary lifestyle.   Referral placed for home health PT to help with strength and endurance. Rx for diclofenac sent to pharmacy.

## 2019-09-22 NOTE — Progress Notes (Signed)
Subjective:    Patient ID: Cathy Rose, female    DOB: 03-24-1924, 86 y.o.   MRN: [id number]  HPI  This visit occurred during the SARS-CoV-2 public health emergency.  Safety protocols were in place, including screening questions prior to the visit, additional usage of staff PPE, and extensive cleaning of exam room while observing appropriate contact time as indicated for disinfecting solutions.   Cathy Rose is a 86 year old female who presents today for complete physical.  Her niece is with her today who is providing most of the information for HPI. She would like to discuss chronic left knee pain and lower extremity edema.   Immunizations: -Influenza: Due this season  -Shingles: Never completed -Pneumonia: Completed previously  -Covid-19: Completed series  Diet: She endorses a fair diet Exercise: She is sedentary for the most part.   Eye exam: No recent exam Dental exam: No recent exam  Mammogram: Completed in 2016, declines given ago Colonoscopy: No applicable given  Hep C Screen: Negative   BP Readings from Last 3 Encounters:  09/22/19 140/82  07/23/19 140/82  05/20/19 (!) 140/100   Her daughter is checking her blood pressure which is running mostly 130's/80's off of amlodipine. She will provide her with amlodipine about 2 times weekly if BP elevates at or above 160 systolic.    Her niece is concerned about left lower extremity and knee swelling that began around April 2021. She saw Dr. Patsy Lager at the time who provided her with an injection. She noticed some improvement with pain at the time, but continues to notice pain and swelling. Her niece is concerned about a blood clot.   Review of Systems  Constitutional: Negative for unexpected weight change.  HENT: Negative for rhinorrhea.   Respiratory: Negative for cough and shortness of breath.   Cardiovascular: Negative for chest pain.  Gastrointestinal: Negative for constipation and diarrhea.  Genitourinary:  Negative for difficulty urinating.  Musculoskeletal: Positive for arthralgias and joint swelling.  Skin: Negative for color change and rash.  Allergic/Immunologic: Negative for environmental allergies.  Neurological: Negative for dizziness, numbness and headaches.  Psychiatric/Behavioral: The patient is not nervous/anxious.        Past Medical History:  Diagnosis Date  . Alzheimer's disease (HCC)   . Anxiety   . Dementia (HCC)   . Depression   . GERD (gastroesophageal reflux disease)   . Hallucinations   . Headache   . Heart beat abnormality   . Hypertension   . Kidney disease   . Osteoarthritis   . Stroke (HCC)   . TIA (transient ischemic attack)   . Vitamin B deficiency   . Vitamin D deficiency      Social History   Socioeconomic History  . Marital status: Legally Separated    Spouse name: Not on file  . Number of children: 0  . Years of education: HS  . Highest education level: Not on file  Occupational History  . Occupation: Retired  Tobacco Use  . Smoking status: Never Smoker  . Smokeless tobacco: Never Used  Substance and Sexual Activity  . Alcohol use: No  . Drug use: No  . Sexual activity: Not on file  Other Topics Concern  . Not on file  Social History Narrative   Lives at home with her niece   Right-handed.   Occasional caffeine use.   Social Determinants of Health   Financial Resource Strain:   . Difficulty of Paying Living Expenses: Not on file  Food  Insecurity:   . Worried About Programme researcher, broadcasting/film/video in the Last Year: Not on file  . Ran Out of Food in the Last Year: Not on file  Transportation Needs:   . Lack of Transportation (Medical): Not on file  . Lack of Transportation (Non-Medical): Not on file  Physical Activity:   . Days of Exercise per Week: Not on file  . Minutes of Exercise per Session: Not on file  Stress:   . Feeling of Stress : Not on file  Social Connections:   . Frequency of Communication with Friends and Family: Not on  file  . Frequency of Social Gatherings with Friends and Family: Not on file  . Attends Religious Services: Not on file  . Active Member of Clubs or Organizations: Not on file  . Attends Banker Meetings: Not on file  . Marital Status: Not on file  Intimate Partner Violence:   . Fear of Current or Ex-Partner: Not on file  . Emotionally Abused: Not on file  . Physically Abused: Not on file  . Sexually Abused: Not on file    No past surgical history on file.  Family History  Problem Relation Age of Onset  . Stroke Father   . Colon cancer Sister   . Kidney disease Sister     No Known Allergies  Current Outpatient Medications on File Prior to Visit  Medication Sig Dispense Refill  . ibuprofen (ADVIL) 200 MG tablet Take 200 mg by mouth every 4 (four) hours as needed.     No current facility-administered medications on file prior to visit.    BP 140/82   Pulse 70   Temp (!) 96.9 F (36.1 C) (Temporal)   Ht 4\' 8"  (1.422 m)   Wt 135 lb 8 oz (61.5 kg)   SpO2 98%   BMI 30.38 kg/m    Objective:   Physical Exam HENT:     Right Ear: Tympanic membrane and ear canal normal.     Left Ear: Tympanic membrane and ear canal normal.  Eyes:     Pupils: Pupils are equal, round, and reactive to light.  Cardiovascular:     Rate and Rhythm: Normal rate and regular rhythm.  Pulmonary:     Effort: Pulmonary effort is normal.     Breath sounds: Normal breath sounds.  Abdominal:     General: Bowel sounds are normal.     Palpations: Abdomen is soft.     Tenderness: There is no abdominal tenderness.  Musculoskeletal:        General: Normal range of motion.     Cervical back: Neck supple.       Legs:     Comments: Mild to moderate swelling to left anterior knee, also down left lower extremity distally to knee. No calf tenderness or erythema.   Skin:    General: Skin is warm and dry.  Neurological:     Mental Status: She is alert and oriented to person, place, and time.      Cranial Nerves: No cranial nerve deficit.     Deep Tendon Reflexes:     Reflex Scores:      Patellar reflexes are 2+ on the right side and 2+ on the left side. Psychiatric:        Mood and Affect: Mood normal.            Assessment & Plan:

## 2019-09-22 NOTE — Assessment & Plan Note (Signed)
Chronic, suspect this to be contributing to her left lower extremity swelling. She does lead a sedentary lifestyle.   Will order left lower extremity ultrasound to rule out DVT, especially given her sedentary lifestyle.

## 2019-09-28 DIAGNOSIS — I69311 Memory deficit following cerebral infarction: Secondary | ICD-10-CM | POA: Diagnosis not present

## 2019-09-28 DIAGNOSIS — F419 Anxiety disorder, unspecified: Secondary | ICD-10-CM | POA: Diagnosis not present

## 2019-09-28 DIAGNOSIS — G8929 Other chronic pain: Secondary | ICD-10-CM | POA: Diagnosis not present

## 2019-09-28 DIAGNOSIS — F0280 Dementia in other diseases classified elsewhere without behavioral disturbance: Secondary | ICD-10-CM | POA: Diagnosis not present

## 2019-09-28 DIAGNOSIS — M170 Bilateral primary osteoarthritis of knee: Secondary | ICD-10-CM | POA: Diagnosis not present

## 2019-09-28 DIAGNOSIS — G309 Alzheimer's disease, unspecified: Secondary | ICD-10-CM | POA: Diagnosis not present

## 2019-09-28 DIAGNOSIS — F329 Major depressive disorder, single episode, unspecified: Secondary | ICD-10-CM | POA: Diagnosis not present

## 2019-09-28 DIAGNOSIS — N289 Disorder of kidney and ureter, unspecified: Secondary | ICD-10-CM | POA: Diagnosis not present

## 2019-09-28 DIAGNOSIS — I10 Essential (primary) hypertension: Secondary | ICD-10-CM | POA: Diagnosis not present

## 2019-09-29 ENCOUNTER — Ambulatory Visit
Admission: RE | Admit: 2019-09-29 | Discharge: 2019-09-29 | Disposition: A | Discharge status: Home / Self Care | Payer: Medicare HMO | Source: Ambulatory Visit | Attending: Primary Care | Admitting: Primary Care

## 2019-09-29 ENCOUNTER — Other Ambulatory Visit: Payer: Self-pay

## 2019-09-29 DIAGNOSIS — M79605 Pain in left leg: Secondary | ICD-10-CM | POA: Diagnosis not present

## 2019-09-29 DIAGNOSIS — M7989 Other specified soft tissue disorders: Secondary | ICD-10-CM

## 2019-09-29 DIAGNOSIS — R600 Localized edema: Secondary | ICD-10-CM | POA: Diagnosis not present

## 2019-09-30 DIAGNOSIS — G8929 Other chronic pain: Secondary | ICD-10-CM | POA: Diagnosis not present

## 2019-09-30 DIAGNOSIS — I69311 Memory deficit following cerebral infarction: Secondary | ICD-10-CM | POA: Diagnosis not present

## 2019-09-30 DIAGNOSIS — F329 Major depressive disorder, single episode, unspecified: Secondary | ICD-10-CM | POA: Diagnosis not present

## 2019-09-30 DIAGNOSIS — G309 Alzheimer's disease, unspecified: Secondary | ICD-10-CM | POA: Diagnosis not present

## 2019-09-30 DIAGNOSIS — F0280 Dementia in other diseases classified elsewhere without behavioral disturbance: Secondary | ICD-10-CM | POA: Diagnosis not present

## 2019-09-30 DIAGNOSIS — M170 Bilateral primary osteoarthritis of knee: Secondary | ICD-10-CM | POA: Diagnosis not present

## 2019-09-30 DIAGNOSIS — I10 Essential (primary) hypertension: Secondary | ICD-10-CM | POA: Diagnosis not present

## 2019-09-30 DIAGNOSIS — F419 Anxiety disorder, unspecified: Secondary | ICD-10-CM | POA: Diagnosis not present

## 2019-09-30 DIAGNOSIS — N289 Disorder of kidney and ureter, unspecified: Secondary | ICD-10-CM | POA: Diagnosis not present

## 2019-10-05 DIAGNOSIS — M170 Bilateral primary osteoarthritis of knee: Secondary | ICD-10-CM | POA: Diagnosis not present

## 2019-10-05 DIAGNOSIS — I10 Essential (primary) hypertension: Secondary | ICD-10-CM | POA: Diagnosis not present

## 2019-10-05 DIAGNOSIS — G8929 Other chronic pain: Secondary | ICD-10-CM | POA: Diagnosis not present

## 2019-10-05 DIAGNOSIS — F419 Anxiety disorder, unspecified: Secondary | ICD-10-CM | POA: Diagnosis not present

## 2019-10-05 DIAGNOSIS — F0280 Dementia in other diseases classified elsewhere without behavioral disturbance: Secondary | ICD-10-CM | POA: Diagnosis not present

## 2019-10-05 DIAGNOSIS — N289 Disorder of kidney and ureter, unspecified: Secondary | ICD-10-CM | POA: Diagnosis not present

## 2019-10-05 DIAGNOSIS — G309 Alzheimer's disease, unspecified: Secondary | ICD-10-CM | POA: Diagnosis not present

## 2019-10-05 DIAGNOSIS — F329 Major depressive disorder, single episode, unspecified: Secondary | ICD-10-CM | POA: Diagnosis not present

## 2019-10-05 DIAGNOSIS — I69311 Memory deficit following cerebral infarction: Secondary | ICD-10-CM | POA: Diagnosis not present

## 2019-10-06 DIAGNOSIS — R2689 Other abnormalities of gait and mobility: Secondary | ICD-10-CM | POA: Diagnosis not present

## 2019-10-06 DIAGNOSIS — G309 Alzheimer's disease, unspecified: Secondary | ICD-10-CM | POA: Diagnosis not present

## 2019-10-07 DIAGNOSIS — F0280 Dementia in other diseases classified elsewhere without behavioral disturbance: Secondary | ICD-10-CM | POA: Diagnosis not present

## 2019-10-07 DIAGNOSIS — F329 Major depressive disorder, single episode, unspecified: Secondary | ICD-10-CM | POA: Diagnosis not present

## 2019-10-07 DIAGNOSIS — I69311 Memory deficit following cerebral infarction: Secondary | ICD-10-CM | POA: Diagnosis not present

## 2019-10-07 DIAGNOSIS — F419 Anxiety disorder, unspecified: Secondary | ICD-10-CM | POA: Diagnosis not present

## 2019-10-07 DIAGNOSIS — M170 Bilateral primary osteoarthritis of knee: Secondary | ICD-10-CM | POA: Diagnosis not present

## 2019-10-07 DIAGNOSIS — N289 Disorder of kidney and ureter, unspecified: Secondary | ICD-10-CM | POA: Diagnosis not present

## 2019-10-07 DIAGNOSIS — G309 Alzheimer's disease, unspecified: Secondary | ICD-10-CM | POA: Diagnosis not present

## 2019-10-07 DIAGNOSIS — I10 Essential (primary) hypertension: Secondary | ICD-10-CM | POA: Diagnosis not present

## 2019-10-07 DIAGNOSIS — G8929 Other chronic pain: Secondary | ICD-10-CM | POA: Diagnosis not present

## 2019-10-11 DIAGNOSIS — G8929 Other chronic pain: Secondary | ICD-10-CM | POA: Diagnosis not present

## 2019-10-11 DIAGNOSIS — G309 Alzheimer's disease, unspecified: Secondary | ICD-10-CM | POA: Diagnosis not present

## 2019-10-11 DIAGNOSIS — F419 Anxiety disorder, unspecified: Secondary | ICD-10-CM | POA: Diagnosis not present

## 2019-10-11 DIAGNOSIS — I69311 Memory deficit following cerebral infarction: Secondary | ICD-10-CM | POA: Diagnosis not present

## 2019-10-11 DIAGNOSIS — M170 Bilateral primary osteoarthritis of knee: Secondary | ICD-10-CM | POA: Diagnosis not present

## 2019-10-11 DIAGNOSIS — N289 Disorder of kidney and ureter, unspecified: Secondary | ICD-10-CM | POA: Diagnosis not present

## 2019-10-11 DIAGNOSIS — F0280 Dementia in other diseases classified elsewhere without behavioral disturbance: Secondary | ICD-10-CM | POA: Diagnosis not present

## 2019-10-11 DIAGNOSIS — I10 Essential (primary) hypertension: Secondary | ICD-10-CM | POA: Diagnosis not present

## 2019-10-11 DIAGNOSIS — F329 Major depressive disorder, single episode, unspecified: Secondary | ICD-10-CM | POA: Diagnosis not present

## 2019-10-12 DIAGNOSIS — F0280 Dementia in other diseases classified elsewhere without behavioral disturbance: Secondary | ICD-10-CM | POA: Diagnosis not present

## 2019-10-12 DIAGNOSIS — E559 Vitamin D deficiency, unspecified: Secondary | ICD-10-CM

## 2019-10-12 DIAGNOSIS — G309 Alzheimer's disease, unspecified: Secondary | ICD-10-CM | POA: Diagnosis not present

## 2019-10-12 DIAGNOSIS — Z9181 History of falling: Secondary | ICD-10-CM

## 2019-10-12 DIAGNOSIS — M170 Bilateral primary osteoarthritis of knee: Secondary | ICD-10-CM | POA: Diagnosis not present

## 2019-10-12 DIAGNOSIS — I10 Essential (primary) hypertension: Secondary | ICD-10-CM | POA: Diagnosis not present

## 2019-10-12 DIAGNOSIS — I69311 Memory deficit following cerebral infarction: Secondary | ICD-10-CM

## 2019-10-12 DIAGNOSIS — E539 Vitamin B deficiency, unspecified: Secondary | ICD-10-CM

## 2019-10-12 DIAGNOSIS — G8929 Other chronic pain: Secondary | ICD-10-CM | POA: Diagnosis not present

## 2019-10-12 DIAGNOSIS — K219 Gastro-esophageal reflux disease without esophagitis: Secondary | ICD-10-CM

## 2019-10-12 DIAGNOSIS — F419 Anxiety disorder, unspecified: Secondary | ICD-10-CM | POA: Diagnosis not present

## 2019-10-12 DIAGNOSIS — N289 Disorder of kidney and ureter, unspecified: Secondary | ICD-10-CM | POA: Diagnosis not present

## 2019-10-12 DIAGNOSIS — F329 Major depressive disorder, single episode, unspecified: Secondary | ICD-10-CM | POA: Diagnosis not present

## 2019-10-13 DIAGNOSIS — I69311 Memory deficit following cerebral infarction: Secondary | ICD-10-CM | POA: Diagnosis not present

## 2019-10-13 DIAGNOSIS — F419 Anxiety disorder, unspecified: Secondary | ICD-10-CM | POA: Diagnosis not present

## 2019-10-13 DIAGNOSIS — M170 Bilateral primary osteoarthritis of knee: Secondary | ICD-10-CM | POA: Diagnosis not present

## 2019-10-13 DIAGNOSIS — F329 Major depressive disorder, single episode, unspecified: Secondary | ICD-10-CM | POA: Diagnosis not present

## 2019-10-13 DIAGNOSIS — I10 Essential (primary) hypertension: Secondary | ICD-10-CM | POA: Diagnosis not present

## 2019-10-13 DIAGNOSIS — G8929 Other chronic pain: Secondary | ICD-10-CM | POA: Diagnosis not present

## 2019-10-13 DIAGNOSIS — G309 Alzheimer's disease, unspecified: Secondary | ICD-10-CM | POA: Diagnosis not present

## 2019-10-13 DIAGNOSIS — N289 Disorder of kidney and ureter, unspecified: Secondary | ICD-10-CM | POA: Diagnosis not present

## 2019-10-13 DIAGNOSIS — F0280 Dementia in other diseases classified elsewhere without behavioral disturbance: Secondary | ICD-10-CM | POA: Diagnosis not present

## 2019-10-18 DIAGNOSIS — G309 Alzheimer's disease, unspecified: Secondary | ICD-10-CM | POA: Diagnosis not present

## 2019-10-18 DIAGNOSIS — F419 Anxiety disorder, unspecified: Secondary | ICD-10-CM | POA: Diagnosis not present

## 2019-10-18 DIAGNOSIS — G8929 Other chronic pain: Secondary | ICD-10-CM | POA: Diagnosis not present

## 2019-10-18 DIAGNOSIS — I10 Essential (primary) hypertension: Secondary | ICD-10-CM | POA: Diagnosis not present

## 2019-10-18 DIAGNOSIS — I69311 Memory deficit following cerebral infarction: Secondary | ICD-10-CM | POA: Diagnosis not present

## 2019-10-18 DIAGNOSIS — N289 Disorder of kidney and ureter, unspecified: Secondary | ICD-10-CM | POA: Diagnosis not present

## 2019-10-18 DIAGNOSIS — F0280 Dementia in other diseases classified elsewhere without behavioral disturbance: Secondary | ICD-10-CM | POA: Diagnosis not present

## 2019-10-18 DIAGNOSIS — F329 Major depressive disorder, single episode, unspecified: Secondary | ICD-10-CM | POA: Diagnosis not present

## 2019-10-18 DIAGNOSIS — M170 Bilateral primary osteoarthritis of knee: Secondary | ICD-10-CM | POA: Diagnosis not present

## 2019-10-19 ENCOUNTER — Other Ambulatory Visit: Payer: Self-pay | Admitting: Primary Care

## 2019-10-19 DIAGNOSIS — M1712 Unilateral primary osteoarthritis, left knee: Secondary | ICD-10-CM

## 2019-10-22 NOTE — Telephone Encounter (Signed)
Is she actually still taking this or was it an auto refill?

## 2019-10-22 NOTE — Telephone Encounter (Signed)
LAST APPOINTMENT DATE: 09/22/2019 - CPE  Seen by sports med for this issue on 07/23/2019  NEXT APPOINTMENT DATE: Visit date not found    LAST REFILL: 09/22/2019  QTY: #60 with 0 rf

## 2019-10-25 DIAGNOSIS — F329 Major depressive disorder, single episode, unspecified: Secondary | ICD-10-CM | POA: Diagnosis not present

## 2019-10-25 DIAGNOSIS — I69311 Memory deficit following cerebral infarction: Secondary | ICD-10-CM | POA: Diagnosis not present

## 2019-10-25 DIAGNOSIS — G309 Alzheimer's disease, unspecified: Secondary | ICD-10-CM | POA: Diagnosis not present

## 2019-10-25 DIAGNOSIS — N289 Disorder of kidney and ureter, unspecified: Secondary | ICD-10-CM | POA: Diagnosis not present

## 2019-10-25 DIAGNOSIS — G8929 Other chronic pain: Secondary | ICD-10-CM | POA: Diagnosis not present

## 2019-10-25 DIAGNOSIS — F0280 Dementia in other diseases classified elsewhere without behavioral disturbance: Secondary | ICD-10-CM | POA: Diagnosis not present

## 2019-10-25 DIAGNOSIS — I10 Essential (primary) hypertension: Secondary | ICD-10-CM | POA: Diagnosis not present

## 2019-10-25 DIAGNOSIS — F419 Anxiety disorder, unspecified: Secondary | ICD-10-CM | POA: Diagnosis not present

## 2019-10-25 DIAGNOSIS — M170 Bilateral primary osteoarthritis of knee: Secondary | ICD-10-CM | POA: Diagnosis not present

## 2019-10-28 DIAGNOSIS — F419 Anxiety disorder, unspecified: Secondary | ICD-10-CM | POA: Diagnosis not present

## 2019-10-28 DIAGNOSIS — N289 Disorder of kidney and ureter, unspecified: Secondary | ICD-10-CM | POA: Diagnosis not present

## 2019-10-28 DIAGNOSIS — I10 Essential (primary) hypertension: Secondary | ICD-10-CM | POA: Diagnosis not present

## 2019-10-28 DIAGNOSIS — F0280 Dementia in other diseases classified elsewhere without behavioral disturbance: Secondary | ICD-10-CM | POA: Diagnosis not present

## 2019-10-28 DIAGNOSIS — F329 Major depressive disorder, single episode, unspecified: Secondary | ICD-10-CM | POA: Diagnosis not present

## 2019-10-28 DIAGNOSIS — G309 Alzheimer's disease, unspecified: Secondary | ICD-10-CM | POA: Diagnosis not present

## 2019-10-28 DIAGNOSIS — G8929 Other chronic pain: Secondary | ICD-10-CM | POA: Diagnosis not present

## 2019-10-28 DIAGNOSIS — M170 Bilateral primary osteoarthritis of knee: Secondary | ICD-10-CM | POA: Diagnosis not present

## 2019-10-28 DIAGNOSIS — I69311 Memory deficit following cerebral infarction: Secondary | ICD-10-CM | POA: Diagnosis not present

## 2019-10-29 NOTE — Telephone Encounter (Signed)
Refills sent to pharmacy. 

## 2019-10-29 NOTE — Telephone Encounter (Signed)
Called and she is taking twice a day ok to send in refill.

## 2019-11-01 DIAGNOSIS — I10 Essential (primary) hypertension: Secondary | ICD-10-CM | POA: Diagnosis not present

## 2019-11-01 DIAGNOSIS — M170 Bilateral primary osteoarthritis of knee: Secondary | ICD-10-CM | POA: Diagnosis not present

## 2019-11-01 DIAGNOSIS — G309 Alzheimer's disease, unspecified: Secondary | ICD-10-CM | POA: Diagnosis not present

## 2019-11-01 DIAGNOSIS — F329 Major depressive disorder, single episode, unspecified: Secondary | ICD-10-CM | POA: Diagnosis not present

## 2019-11-01 DIAGNOSIS — F0280 Dementia in other diseases classified elsewhere without behavioral disturbance: Secondary | ICD-10-CM | POA: Diagnosis not present

## 2019-11-01 DIAGNOSIS — N289 Disorder of kidney and ureter, unspecified: Secondary | ICD-10-CM | POA: Diagnosis not present

## 2019-11-01 DIAGNOSIS — F419 Anxiety disorder, unspecified: Secondary | ICD-10-CM | POA: Diagnosis not present

## 2019-11-01 DIAGNOSIS — I69311 Memory deficit following cerebral infarction: Secondary | ICD-10-CM | POA: Diagnosis not present

## 2019-11-01 DIAGNOSIS — G8929 Other chronic pain: Secondary | ICD-10-CM | POA: Diagnosis not present

## 2019-11-08 DIAGNOSIS — F0280 Dementia in other diseases classified elsewhere without behavioral disturbance: Secondary | ICD-10-CM | POA: Diagnosis not present

## 2019-11-08 DIAGNOSIS — I10 Essential (primary) hypertension: Secondary | ICD-10-CM | POA: Diagnosis not present

## 2019-11-08 DIAGNOSIS — G309 Alzheimer's disease, unspecified: Secondary | ICD-10-CM | POA: Diagnosis not present

## 2019-11-08 DIAGNOSIS — F419 Anxiety disorder, unspecified: Secondary | ICD-10-CM | POA: Diagnosis not present

## 2019-11-08 DIAGNOSIS — F329 Major depressive disorder, single episode, unspecified: Secondary | ICD-10-CM | POA: Diagnosis not present

## 2019-11-08 DIAGNOSIS — G8929 Other chronic pain: Secondary | ICD-10-CM | POA: Diagnosis not present

## 2019-11-08 DIAGNOSIS — I69311 Memory deficit following cerebral infarction: Secondary | ICD-10-CM | POA: Diagnosis not present

## 2019-11-08 DIAGNOSIS — M170 Bilateral primary osteoarthritis of knee: Secondary | ICD-10-CM | POA: Diagnosis not present

## 2019-11-08 DIAGNOSIS — N289 Disorder of kidney and ureter, unspecified: Secondary | ICD-10-CM | POA: Diagnosis not present

## 2019-11-22 ENCOUNTER — Other Ambulatory Visit: Payer: Self-pay

## 2019-11-22 DIAGNOSIS — M1712 Unilateral primary osteoarthritis, left knee: Secondary | ICD-10-CM

## 2019-11-22 NOTE — Telephone Encounter (Signed)
Is she still taking this on a daily basis, twice daily?  If she is and would like to continue taking the diclofenac, then I'd like to check her kidney function. Let me know and then set up lab appointment.  Thanks!

## 2019-11-22 NOTE — Telephone Encounter (Signed)
Diclofenac 75mg  bid    LAST APPOINTMENT DATE: 09/22/2019 for CPE    NEXT APPOINTMENT DATE: Visit date not found    LAST REFILL: 10/29/2019  QTY: #60

## 2019-11-24 NOTE — Telephone Encounter (Signed)
Voicemail not set up.

## 2019-11-27 ENCOUNTER — Other Ambulatory Visit: Payer: Self-pay

## 2019-11-27 NOTE — Telephone Encounter (Signed)
Noted and appreciate the update! 

## 2019-11-27 NOTE — Telephone Encounter (Signed)
Called and spoke to niece (on dpr) she does not feel like it is helping much. She has only been taking one daily. She has just finished up with her PT and has a lot of improvement from that. Will call if any issues. Wanted to let you know patient is doing well with palliative care.

## 2019-11-29 DIAGNOSIS — G309 Alzheimer's disease, unspecified: Secondary | ICD-10-CM | POA: Diagnosis not present

## 2019-11-29 DIAGNOSIS — R2689 Other abnormalities of gait and mobility: Secondary | ICD-10-CM | POA: Diagnosis not present

## 2019-12-08 ENCOUNTER — Telehealth: Payer: Self-pay

## 2019-12-08 NOTE — Telephone Encounter (Signed)
Received message to call patient's daughter. Returned call. VM left

## 2019-12-09 ENCOUNTER — Encounter: Payer: Self-pay | Admitting: Podiatry

## 2019-12-09 ENCOUNTER — Ambulatory Visit (INDEPENDENT_AMBULATORY_CARE_PROVIDER_SITE_OTHER): Payer: Medicare HMO | Admitting: Podiatry

## 2019-12-09 DIAGNOSIS — L608 Other nail disorders: Secondary | ICD-10-CM | POA: Diagnosis not present

## 2019-12-09 DIAGNOSIS — M79675 Pain in left toe(s): Secondary | ICD-10-CM | POA: Diagnosis not present

## 2019-12-09 DIAGNOSIS — B351 Tinea unguium: Secondary | ICD-10-CM | POA: Diagnosis not present

## 2019-12-09 DIAGNOSIS — M79674 Pain in right toe(s): Secondary | ICD-10-CM

## 2019-12-09 NOTE — Progress Notes (Signed)
This patient returns to my office for at risk foot care.  This patient requires this care by a professional since this patient will be at risk due to having dementia and kidney disease.   This patient is unable to cut nails herself since the patient cannot reach her nails.These nails are painful walking and wearing shoes.  This patient presents for at risk foot care today. Patient is brought to the office by her niece.  General Appearance  Alert, conversant and in no acute stress.  Vascular  Dorsalis pedis and posterior tibial  pulses are weakly  palpable  bilaterally.  Capillary return is within normal limits  bilaterally. Temperature is within normal limits  bilaterally.  Neurologic  Senn-Weinstein monofilament wire test within normal limits  bilaterally. Muscle power within normal limits bilaterally.  Nails Thick disfigured discolored nails with subungual debris  from hallux to fifth toes bilaterally. No evidence of bacterial infection or drainage bilaterally. Pincer nails  B/L.  Orthopedic  No limitations of motion  feet .  No crepitus or effusions noted.  No bony pathology or digital deformities noted. HAV  B/L.  Skin  normotropic skin with no porokeratosis noted bilaterally.  No signs of infections or ulcers noted.     Onychomycosis  Pain in right toes  Pain in left toes  Consent was obtained for treatment procedures.   Mechanical debridement of nails 1-5  bilaterally performed with a nail nipper.  Filed with dremel without incident.    Return office visit   3 months                  Told patient to return for periodic foot care and evaluation due to potential at risk complications.   Helane Gunther DPM

## 2019-12-13 ENCOUNTER — Telehealth: Payer: Self-pay

## 2019-12-13 NOTE — Telephone Encounter (Signed)
9:09AM: Palliative care SW outreached patients niece, [name].   Niece stated that she was interested in in [facility] services. She was told by [facility] that patients plan covers a home health aid for 3 hours a week and all of the agencies that she outreached has declined her. SW advised that niece outreach [employer] rep again and obtain a list of in network agencies for patient. Niece inquired about SNF Medicaid. SW provided information and elgibility guidelines for long term care medicaid. Niece stated that he wanted to try and keep patient at home as long as possible before transitioning her to a facility. No further questions at this time. Niece has SW contact information.

## 2019-12-15 ENCOUNTER — Other Ambulatory Visit: Payer: Self-pay | Admitting: Primary Care

## 2019-12-16 ENCOUNTER — Telehealth: Payer: Self-pay | Admitting: Primary Care

## 2019-12-16 DIAGNOSIS — F0390 Unspecified dementia without behavioral disturbance: Secondary | ICD-10-CM

## 2019-12-16 DIAGNOSIS — M1712 Unilateral primary osteoarthritis, left knee: Secondary | ICD-10-CM

## 2019-12-16 DIAGNOSIS — Z9181 History of falling: Secondary | ICD-10-CM

## 2019-12-16 DIAGNOSIS — R29898 Other symptoms and signs involving the musculoskeletal system: Secondary | ICD-10-CM

## 2019-12-16 DIAGNOSIS — R600 Localized edema: Secondary | ICD-10-CM

## 2019-12-16 NOTE — Telephone Encounter (Signed)
I'm unclear on what she is requesting.  Is there an order to place or does she just need our office notes from prior patient visits?

## 2019-12-16 NOTE — Telephone Encounter (Signed)
Ok to place order 

## 2019-12-16 NOTE — Telephone Encounter (Signed)
[name] called for pt she needs Dr. Notes for home health aide to come to the home, and her insurance will pay for 2x a week, she doesn't need a FL2 she wants her to stay home.   Please advise

## 2019-12-17 NOTE — Telephone Encounter (Signed)
irs called back Stating she needs dr note stating pt needs home health care.  Her insurance will pay for homehealth to come in 2x weekly  Best number [phone]

## 2019-12-17 NOTE — Telephone Encounter (Signed)
She needs referral to Home health to evaluate

## 2019-12-17 NOTE — Telephone Encounter (Signed)
Noted, referral placed.  

## 2019-12-20 NOTE — Telephone Encounter (Signed)
Called let daughter know order placed. Updated notes on referral to location she requested[facility]

## 2020-01-03 ENCOUNTER — Telehealth: Payer: Self-pay

## 2020-01-03 NOTE — Telephone Encounter (Signed)
Information I found out is that for ADL needs only for home aid [facility] services do not provide. Patient needs personal care services (I have a list of some agency I can put up front for pick up if they would like). Per [facility] representative Medicare does not cover a home aid only for ADLs, I am not sure who she spoke with about this at their insurance.  Her insurance may have a list of places that places do provide that they can reach out to.  Left message for Ms Cathy Rose to call me back to discuss

## 2020-01-03 NOTE — Telephone Encounter (Signed)
Spoke with Ms. Cathy Rose and provided information below. She has been calling different places to find personal care for the patient but having hard time finding someone that will take patient's insurance. I am sending some information through mychart for Ms Cathy Rose to review that maybe will help, if she has not called them yet. She will keep [place] updated.

## 2020-01-05 DIAGNOSIS — G309 Alzheimer's disease, unspecified: Secondary | ICD-10-CM | POA: Diagnosis not present

## 2020-01-05 DIAGNOSIS — R2689 Other abnormalities of gait and mobility: Secondary | ICD-10-CM | POA: Diagnosis not present

## 2020-02-03 ENCOUNTER — Telehealth: Payer: Self-pay

## 2020-02-03 NOTE — Telephone Encounter (Signed)
Received return  phone call from patient's niece, Cathy Rose.Cathy Rose reported that patient has a "crackling cough". Patient has remained afebrile. Cathy Rose noted that patient is having some shortness of breath and some wheezing while ambulating. Will message PCP and will update Cathy Rose with PCP's response.

## 2020-02-03 NOTE — Telephone Encounter (Signed)
Below information came through staff message from [facility] palliative service. Sending to PCP to address: "Palliative care received a call from [name] family, [name]. [name] called to report that patient has a "crackling cough". She has noted that [name] becomes more short of breath and is wheezing when she is ambulating. Patient is afebrile. No change in her intake. [name] did share that she has not given [name] any medication for the cough due to her medical history. I told her I would reach out to you to provide this information. Thank you for your time! "

## 2020-02-03 NOTE — Telephone Encounter (Signed)
Can we find out some additional information?  How long she been coughing and experiencing dyspnea? Any COVID symptoms?  Given her age I am worried about pneumonia, flu, COVID-19, etc. Has she been tested for COVID?  If so then I would like to see her in the office for evaluation.

## 2020-02-04 NOTE — Telephone Encounter (Signed)
Left message to return call to our office.  

## 2020-02-05 DIAGNOSIS — G309 Alzheimer's disease, unspecified: Secondary | ICD-10-CM | POA: Diagnosis not present

## 2020-02-05 DIAGNOSIS — R2689 Other abnormalities of gait and mobility: Secondary | ICD-10-CM | POA: Diagnosis not present

## 2020-02-09 NOTE — Telephone Encounter (Signed)
Called and left voicemail to return call to office.

## 2020-02-10 NOTE — Telephone Encounter (Signed)
Noted and appreciate the update! 

## 2020-02-10 NOTE — Telephone Encounter (Signed)
Patients daughter called back and stated that the patient is feeling much better and has no symptoms. Daughter wanted to inform Cathy Rose that her mother was doing well an is happy.

## 2020-02-28 ENCOUNTER — Telehealth (INDEPENDENT_AMBULATORY_CARE_PROVIDER_SITE_OTHER): Payer: Medicare HMO | Admitting: Primary Care

## 2020-02-28 ENCOUNTER — Encounter: Payer: Self-pay | Admitting: Primary Care

## 2020-02-28 ENCOUNTER — Other Ambulatory Visit: Payer: Self-pay

## 2020-02-28 DIAGNOSIS — F0391 Unspecified dementia with behavioral disturbance: Secondary | ICD-10-CM

## 2020-02-28 NOTE — Patient Instructions (Signed)
I will complete the FL 2 form and call you when it is ready for pickup.  It was a pleasure to see you today! Mayra Reel, NP-C

## 2020-02-28 NOTE — Progress Notes (Signed)
Subjective:    Patient ID: Cathy Rose, female    DOB: 1924/06/03, 87 y.o.   MRN: [id number]  HPI  Virtual Visit via Video Note  I connected with Cathy Rose on 02/28/20 at 11:20 AM EST by a video enabled telemedicine application and verified that I am speaking with the correct person using two identifiers.  Location: Patient: Home Provider: Office Participants: Patient, patient's niece, myself   I discussed the limitations of evaluation and management by telemedicine and the availability of in person appointments. The patient expressed understanding and agreed to proceed.  History of Present Illness:  Cathy Rose is a 87 year old female with a history of hypertension, dementia, anxiety, anemia who presents today with her niece to discuss FL 2 form.  Her niece is providing all of the information for her HPI as she is unable to contribute.  Chronic, gradual memory loss and inability to care for herself.  She currently resides with her niece who has cared for her over the last 3 years.  Over the years, the patient has required more assistance with ADLs which is getting to be too much for her niece to do.   She needs assistance with dressing (gets confused on how to dress), bathing, preparing meals.  She can feed herself and does toilet herself, but she needs reminders to use the restroom.  She is not wetting her bed or losing urine control. She can only do one command at a time without assistance. She is having mood swings despite treatment with citalopram. She could not tolerate the Namenda and has not taken in months. She is unable to comprehend and care for herself.   Her niece is looking into getting help into the home at least 2 days a week to care for the patient.  She is looking at [facility] and [facility] in case a memory care unit is necessary in the future.  She is compliant to her citalopram 20 mg daily, but stopped Namenda months ago as she  cannot tolerate.    Observations/Objective:  Alert, not participating in HPI Appears well, not sickly. No distress. Comfortable. Able to carry on a simple conversation.    Assessment and Plan:  Gradual decline in mental status secondary to dementia.  Agreed to fill out FL 2 form to allow placement in memory care facility if needed.  For now she will reside with her niece he will be having some help coming to the home twice weekly.  Continue current medications, her niece will reach out with any questions.  Follow Up Instructions:  I will complete the FL 2 form and call you when it is ready for pickup.  It was a pleasure to see you today! Mayra Reel, NP-C    I discussed the assessment and treatment plan with the patient. The patient was provided an opportunity to ask questions and all were answered. The patient agreed with the plan and demonstrated an understanding of the instructions.   The patient was advised to call back or seek an in-person evaluation if the symptoms worsen or if the condition fails to improve as anticipated.    Doreene Nest, NP    Review of Systems  Genitourinary:       No urinary incontinence  Neurological:       See HPI  Psychiatric/Behavioral: Positive for confusion.       Past Medical History:  Diagnosis Date  . Alzheimer's disease (HCC)   .  Anxiety   . Dementia (HCC)   . Depression   . GERD (gastroesophageal reflux disease)   . Hallucinations   . Headache   . Heart beat abnormality   . Hypertension   . Kidney disease   . Osteoarthritis   . Stroke (HCC)   . TIA (transient ischemic attack)   . Vitamin B deficiency   . Vitamin D deficiency      Social History   Socioeconomic History  . Marital status: Legally Separated    Spouse name: Not on file  . Number of children: 0  . Years of education: HS  . Highest education level: Not on file  Occupational History  . Occupation: Retired  Tobacco Use  . Smoking status:  Never Smoker  . Smokeless tobacco: Never Used  Substance and Sexual Activity  . Alcohol use: No  . Drug use: No  . Sexual activity: Not on file  Other Topics Concern  . Not on file  Social History Narrative   Lives at home with her niece   Right-handed.   Occasional caffeine use.   Social Determinants of Health   Financial Resource Strain: Not on file  Food Insecurity: Not on file  Transportation Needs: Not on file  Physical Activity: Not on file  Stress: Not on file  Social Connections: Not on file  Intimate Partner Violence: Not on file    History reviewed. No pertinent surgical history.  Family History  Problem Relation Age of Onset  . Stroke Father   . Colon cancer Sister   . Kidney disease Sister     No Known Allergies  Current Outpatient Medications on File Prior to Visit  Medication Sig Dispense Refill  . amLODipine (NORVASC) 5 MG tablet TAKE 1 TABLET EVERY DAY FOR BLOOD PRESSURE 90 tablet 3  . citalopram (CELEXA) 20 MG tablet TAKE 1 TABLET EVERY DAY FOR ANXIETY 90 tablet 3  . ibuprofen (ADVIL) 200 MG tablet Take 200 mg by mouth every 4 (four) hours as needed.     No current facility-administered medications on file prior to visit.    Ht 4\' 9"  (1.448 m)   Wt 137 lb (62.1 kg)   BMI 29.65 kg/m    Objective:   Physical Exam Constitutional:      General: She is not in acute distress.    Appearance: She is not ill-appearing.  Pulmonary:     Effort: Pulmonary effort is normal.  Neurological:     Mental Status: She is alert.     Comments: She was able to participate in a simple conversation.  Psychiatric:     Comments: Resting quietly and comfortably            Assessment & Plan:

## 2020-02-28 NOTE — Assessment & Plan Note (Signed)
Gradual decline in mental status secondary to dementia.  Agreed to fill out FL 2 form to allow placement in memory care facility if needed.  For now she will reside with her niece he will be having some help coming to the home twice weekly.  Continue current medications, her niece will reach out with any questions.

## 2020-03-07 DIAGNOSIS — G309 Alzheimer's disease, unspecified: Secondary | ICD-10-CM | POA: Diagnosis not present

## 2020-03-07 DIAGNOSIS — R2689 Other abnormalities of gait and mobility: Secondary | ICD-10-CM | POA: Diagnosis not present

## 2020-03-13 ENCOUNTER — Ambulatory Visit: Payer: Medicare HMO | Admitting: Podiatry

## 2020-03-20 ENCOUNTER — Emergency Department (HOSPITAL_COMMUNITY): Payer: Medicare HMO

## 2020-03-20 ENCOUNTER — Telehealth: Payer: Self-pay

## 2020-03-20 ENCOUNTER — Observation Stay (HOSPITAL_COMMUNITY): Payer: Medicare HMO

## 2020-03-20 ENCOUNTER — Other Ambulatory Visit: Payer: Self-pay

## 2020-03-20 ENCOUNTER — Observation Stay (HOSPITAL_COMMUNITY)
Admission: EM | Admit: 2020-03-20 | Discharge: 2020-03-23 | Disposition: A | Discharge status: Home / Self Care | Payer: Medicare HMO | Attending: Internal Medicine | Admitting: Internal Medicine

## 2020-03-20 ENCOUNTER — Encounter (HOSPITAL_COMMUNITY): Payer: Self-pay | Admitting: Emergency Medicine

## 2020-03-20 DIAGNOSIS — F0390 Unspecified dementia without behavioral disturbance: Secondary | ICD-10-CM

## 2020-03-20 DIAGNOSIS — G309 Alzheimer's disease, unspecified: Secondary | ICD-10-CM | POA: Insufficient documentation

## 2020-03-20 DIAGNOSIS — S065XAA Traumatic subdural hemorrhage with loss of consciousness status unknown, initial encounter: Secondary | ICD-10-CM | POA: Diagnosis present

## 2020-03-20 DIAGNOSIS — Y92009 Unspecified place in unspecified non-institutional (private) residence as the place of occurrence of the external cause: Secondary | ICD-10-CM

## 2020-03-20 DIAGNOSIS — I611 Nontraumatic intracerebral hemorrhage in hemisphere, cortical: Secondary | ICD-10-CM | POA: Diagnosis not present

## 2020-03-20 DIAGNOSIS — W19XXXA Unspecified fall, initial encounter: Secondary | ICD-10-CM | POA: Diagnosis not present

## 2020-03-20 DIAGNOSIS — S065X0A Traumatic subdural hemorrhage without loss of consciousness, initial encounter: Principal | ICD-10-CM | POA: Insufficient documentation

## 2020-03-20 DIAGNOSIS — I10 Essential (primary) hypertension: Secondary | ICD-10-CM | POA: Diagnosis not present

## 2020-03-20 DIAGNOSIS — Z8673 Personal history of transient ischemic attack (TIA), and cerebral infarction without residual deficits: Secondary | ICD-10-CM | POA: Diagnosis not present

## 2020-03-20 DIAGNOSIS — I708 Atherosclerosis of other arteries: Secondary | ICD-10-CM | POA: Diagnosis not present

## 2020-03-20 DIAGNOSIS — I739 Peripheral vascular disease, unspecified: Secondary | ICD-10-CM | POA: Diagnosis not present

## 2020-03-20 DIAGNOSIS — Z20822 Contact with and (suspected) exposure to covid-19: Secondary | ICD-10-CM | POA: Insufficient documentation

## 2020-03-20 DIAGNOSIS — Z79899 Other long term (current) drug therapy: Secondary | ICD-10-CM | POA: Insufficient documentation

## 2020-03-20 DIAGNOSIS — S199XXA Unspecified injury of neck, initial encounter: Secondary | ICD-10-CM | POA: Diagnosis not present

## 2020-03-20 DIAGNOSIS — I6782 Cerebral ischemia: Secondary | ICD-10-CM | POA: Diagnosis not present

## 2020-03-20 DIAGNOSIS — S0990XA Unspecified injury of head, initial encounter: Secondary | ICD-10-CM | POA: Diagnosis present

## 2020-03-20 DIAGNOSIS — M4312 Spondylolisthesis, cervical region: Secondary | ICD-10-CM | POA: Diagnosis not present

## 2020-03-20 DIAGNOSIS — S065X9A Traumatic subdural hemorrhage with loss of consciousness of unspecified duration, initial encounter: Secondary | ICD-10-CM

## 2020-03-20 DIAGNOSIS — S066X0A Traumatic subarachnoid hemorrhage without loss of consciousness, initial encounter: Secondary | ICD-10-CM | POA: Diagnosis not present

## 2020-03-20 DIAGNOSIS — S0240CA Maxillary fracture, right side, initial encounter for closed fracture: Secondary | ICD-10-CM | POA: Diagnosis not present

## 2020-03-20 LAB — COMPREHENSIVE METABOLIC PANEL
ALT: 14 U/L (ref 0–44)
AST: 18 U/L (ref 15–41)
Albumin: 4.1 g/dL (ref 3.5–5.0)
Alkaline Phosphatase: 96 U/L (ref 38–126)
Anion gap: 10 (ref 5–15)
BUN: 15 mg/dL (ref 8–23)
CO2: 27 mmol/L (ref 22–32)
Calcium: 9.3 mg/dL (ref 8.9–10.3)
Chloride: 104 mmol/L (ref 98–111)
Creatinine, Ser: 1.1 mg/dL — ABNORMAL HIGH (ref 0.44–1.00)
GFR, Estimated: 47 mL/min — ABNORMAL LOW (ref >60)
Glucose, Bld: 118 mg/dL — ABNORMAL HIGH (ref 70–99)
Potassium: 3.4 mmol/L — ABNORMAL LOW (ref 3.5–5.1)
Sodium: 141 mmol/L (ref 135–145)
Total Bilirubin: 0.3 mg/dL (ref 0.3–1.2)
Total Protein: 6.9 g/dL (ref 6.5–8.1)

## 2020-03-20 LAB — DIFFERENTIAL
Abs Immature Granulocytes: 0.03 10*3/uL (ref 0.00–0.07)
Basophils Absolute: 0 10*3/uL (ref 0.0–0.1)
Basophils Relative: 1 %
Eosinophils Absolute: 0.1 10*3/uL (ref 0.0–0.5)
Eosinophils Relative: 1 %
Immature Granulocytes: 1 %
Lymphocytes Relative: 13 %
Lymphs Abs: 0.8 10*3/uL (ref 0.7–4.0)
Monocytes Absolute: 0.7 10*3/uL (ref 0.1–1.0)
Monocytes Relative: 11 %
Neutro Abs: 4.8 10*3/uL (ref 1.7–7.7)
Neutrophils Relative %: 73 %

## 2020-03-20 LAB — CBC
HCT: 35.2 % — ABNORMAL LOW (ref 36.0–46.0)
Hemoglobin: 12.1 g/dL (ref 12.0–15.0)
MCH: 33.2 pg (ref 26.0–34.0)
MCHC: 34.4 g/dL (ref 30.0–36.0)
MCV: 96.7 fL (ref 80.0–100.0)
Platelets: 242 10*3/uL (ref 150–400)
RBC: 3.64 MIL/uL — ABNORMAL LOW (ref 3.87–5.11)
RDW: 13.2 % (ref 11.5–15.5)
WBC: 6.4 10*3/uL (ref 4.0–10.5)
nRBC: 0 % (ref 0.0–0.2)

## 2020-03-20 LAB — PROTIME-INR
INR: 1 (ref 0.8–1.2)
Prothrombin Time: 13 seconds (ref 11.4–15.2)

## 2020-03-20 LAB — RESP PANEL BY RT-PCR (FLU A&B, COVID) ARPGX2
Influenza A by PCR: NEGATIVE
Influenza B by PCR: NEGATIVE
SARS Coronavirus 2 by RT PCR: NEGATIVE

## 2020-03-20 LAB — APTT: aPTT: 35 seconds (ref 24–36)

## 2020-03-20 MED ORDER — ONDANSETRON HCL 4 MG PO TABS: 4.0000 mg | ORAL_TABLET | Freq: Four times a day (QID) | ORAL | Status: DC | PRN

## 2020-03-20 MED ORDER — POLYETHYLENE GLYCOL 3350 17 G PO PACK: 17.0000 g | PACK | Freq: Every day | ORAL | Status: DC | PRN

## 2020-03-20 MED ORDER — CITALOPRAM HYDROBROMIDE 10 MG PO TABS
20.0000 mg | ORAL_TABLET | Freq: Every day | ORAL | Status: DC
  Administered 2020-03-21 – 2020-03-23 (×3): 20 mg via ORAL
  Filled 2020-03-20 (×4): qty 2

## 2020-03-20 MED ORDER — POTASSIUM CHLORIDE CRYS ER 20 MEQ PO TBCR
40.0000 meq | EXTENDED_RELEASE_TABLET | Freq: Once | ORAL | Status: AC
  Administered 2020-03-21: 40 meq via ORAL

## 2020-03-20 MED ORDER — AMLODIPINE BESYLATE 5 MG PO TABS
5.0000 mg | ORAL_TABLET | Freq: Every day | ORAL | Status: DC
Start: 2020-03-21 — End: 2020-03-23
  Administered 2020-03-21 – 2020-03-23 (×3): 5 mg via ORAL
  Filled 2020-03-20 (×3): qty 1

## 2020-03-20 MED ORDER — ACETAMINOPHEN 650 MG RE SUPP: 650.0000 mg | Freq: Four times a day (QID) | RECTAL | Status: DC | PRN

## 2020-03-20 MED ORDER — ACETAMINOPHEN 325 MG PO TABS
650.0000 mg | ORAL_TABLET | Freq: Four times a day (QID) | ORAL | Status: DC | PRN
  Administered 2020-03-21: 650 mg via ORAL
  Filled 2020-03-20: qty 2

## 2020-03-20 MED ORDER — ONDANSETRON HCL 4 MG/2ML IJ SOLN: 4.0000 mg | Freq: Four times a day (QID) | INTRAMUSCULAR | Status: DC | PRN

## 2020-03-20 NOTE — ED Notes (Signed)
Attempted report, placed on hold for over 5 mins

## 2020-03-20 NOTE — ED Notes (Signed)
Attempted to call report

## 2020-03-20 NOTE — Progress Notes (Signed)
Patient ID: Cathy Rose, female   DOB: January 23, 1925, 87 y.o.   MRN: [id number] CT reviewed minimal and clinically insignificant subacute to chronic subdural fluid collections with a very small amount of acute blood on the left with no mass-effect.  Scan is now 24 hours after the fall patient can be observed can repeat the CT in 6 hours or in a.m. but no neurosurgical intervention is needed at this point

## 2020-03-20 NOTE — Telephone Encounter (Signed)
Received message that patient's niece, [name] called to report that patient had a fall last night resulting in facial bruising on her left side. Returned call to [facility] who shared that when she went to check on patient in the middle of the night, patient was on the floor in the corner of the room. Patient has bruising on the left side of her face and her left eye is red. [name] observed that patient has been slower to respond to direction since her fall and feels that she may have had a small stroke. Encouraged [name] to take patient to ED

## 2020-03-20 NOTE — H&P (Addendum)
History and Physical    Cathy Rose ONG:[id number] DOB: 07/13/1924 DOA: 03/20/2020  PCP: Doreene Nest, NP   Patient coming from:  Home  I have personally briefly reviewed patient's old medical records in [facility]  Chief Complaint: Fall with facial trauma  HPI: Cathy Rose is a 87 y.o. female with medical history significant of advanced dementia due to Alzheimer disease, hypertension, anxiety/depression, gastroesophageal reflux disease, history of stroke program to hospital tonight for evaluation of traumatic fall with head injury.  Family reported patient was found on the floor on Sunday morning around 1:30 AM, with presumed fall due to weakness, and sustained head trauma against the dresser, leading to bruising over her right eye.  While family was trying to assist her, she saw them again with subsequent head injury.  Patient is under palliative care, due to her dementia and was told to monitor patient closely while at home.  Over the next 24 to 48 hours, develop worsening confusion, with difficulty formulating sentences, stool and urine incontinence, as well as ongoing headache.  Since this was happening she was brought to ED for evaluation. In the ED she was afebrile temperature 98.8 F.  Blood pressure was slightly elevated 149/76, with normal heart rate of 82/min. No hypoxia 95% on room air. CBC showed no leukocytosis or anemia hemoglobin 12.1.  Chemistry revealed normal renal function, creatinine 1.1, with mild hypokalemia 3.4.  CT head showed acute subdural hematoma on the right and left measuring about 5 mm in thickness. CT also revealed fractures of the maxillary wall, right lateral and anterior with some mild displacement, and paranasal sinus opacification. Patient was seen by neurosurgery recommended 24 observation with repeat CT head in the morning. Patient will be admitted to hospital for further evaluation and treatment and close observation  ED  Course:   Review of Systems: Review of Systems  Constitutional: Negative for chills, fever and malaise/fatigue.  HENT: Negative for ear discharge, hearing loss, nosebleeds, sinus pain, sore throat and tinnitus.   Respiratory: Negative for cough, shortness of breath and wheezing.   Cardiovascular: Positive for leg swelling. Negative for chest pain.  Gastrointestinal: Negative for abdominal pain, diarrhea, nausea and vomiting.  Genitourinary: Negative for dysuria, frequency and urgency.  Musculoskeletal: Negative for back pain, joint pain, myalgias and neck pain.  Neurological: Positive for dizziness, speech change, weakness and headaches. Negative for tingling, tremors, sensory change, focal weakness, seizures and loss of consciousness.  Psychiatric/Behavioral: Positive for depression. Negative for hallucinations. The patient is not nervous/anxious.    As per HPI otherwise all other systems reviewed and are negative.   Past Medical History:  Diagnosis Date  . Alzheimer's disease (HCC)   . Anxiety   . Dementia (HCC)   . Depression   . GERD (gastroesophageal reflux disease)   . Hallucinations   . Headache   . Heart beat abnormality   . Hypertension   . Kidney disease   . Osteoarthritis   . Stroke (HCC)   . TIA (transient ischemic attack)   . Vitamin B deficiency   . Vitamin D deficiency     History reviewed. No pertinent surgical history.  Social History  reports that she has never smoked. She has never used smokeless tobacco. She reports that she does not drink alcohol and does not use drugs.  No Known Allergies  Family History  Problem Relation Age of Onset  . Stroke Father   . Colon cancer Sister   . Kidney disease Sister  Prior to Admission medications   Medication Sig Start Date End Date Taking? Authorizing Provider  amLODipine (NORVASC) 5 MG tablet TAKE 1 TABLET EVERY DAY FOR BLOOD PRESSURE 09/22/19   Doreene Nest, NP  citalopram (CELEXA) 20 MG tablet TAKE  1 TABLET EVERY DAY FOR ANXIETY 09/22/19   Doreene Nest, NP  ibuprofen (ADVIL) 200 MG tablet Take 200 mg by mouth every 4 (four) hours as needed.    [provider]    Physical Exam: Vitals:   03/20/20 1526 03/20/20 1645 03/20/20 1658 03/20/20 1730  BP: (!) 141/92 137/84 (!) 149/85 (!) 149/76  Pulse: 87 88 98 82  Resp: 20 14 (!) 21 17  Temp: 98.9 F (37.2 C) 99 F (37.2 C) 98.8 F (37.1 C)   TempSrc: Oral  Oral   SpO2: 99% 99% 98% 95%  Weight: 62.1 kg     Height: 4\' 9"  (1.448 m)       Constitutional: NAD, calm, comfortable Vitals:   03/20/20 1526 03/20/20 1645 03/20/20 1658 03/20/20 1730  BP: (!) 141/92 137/84 (!) 149/85 (!) 149/76  Pulse: 87 88 98 82  Resp: 20 14 (!) 21 17  Temp: 98.9 F (37.2 C) 99 F (37.2 C) 98.8 F (37.1 C)   TempSrc: Oral  Oral   SpO2: 99% 99% 98% 95%  Weight: 62.1 kg     Height: 4\' 9"  (1.448 m)      Eyes: PERRL, lids and conjunctivae normal ENMT: Mucous membranes are moist. Posterior pharynx clear of any exudate or lesions.Normal dentition.  Neck: normal, supple, no masses, no thyromegaly Respiratory: clear to auscultation bilaterally, no wheezing, no crackles. Normal respiratory effort. No accessory muscle use.  Cardiovascular: Regular rate and rhythm, no murmurs / rubs / gallops. No extremity edema. 2+ pedal pulses. No carotid bruits.  Abdomen: no tenderness, no masses palpated. No hepatosplenomegaly. Bowel sounds positive.  Musculoskeletal: no clubbing / cyanosis. No joint deformity upper and lower extremities. Good ROM, no contractures. Normal muscle tone.  Skin: Ecchymosis area around right eye; No rashes, ulcers. No induration Neurologic: CN 2-12 grossly intact. Sensation intact, DTR normal. Strength 5/5 in all 4.  Psychiatric: Normal judgment and insight. Alert and oriented x 3. Normal mood.   Labs on Admission: I have personally reviewed following labs and imaging studies  CBC: Recent Labs  Lab 03/20/20 1536  WBC 6.4   NEUTROABS 4.8  HGB 12.1  HCT 35.2*  MCV 96.7  PLT 242    Basic Metabolic Panel: Recent Labs  Lab 03/20/20 1536  NA 141  K 3.4*  CL 104  CO2 27  GLUCOSE 118*  BUN 15  CREATININE 1.10*  CALCIUM 9.3    GFR: Estimated Creatinine Clearance: 24.2 mL/min (A) (by C-G formula based on SCr of 1.1 mg/dL (H)).  Liver Function Tests: Recent Labs  Lab 03/20/20 1536  AST 18  ALT 14  ALKPHOS 96  BILITOT 0.3  PROT 6.9  ALBUMIN 4.1    Urine analysis:    Component Value Date/Time   COLORURINE YELLOW 11/25/2015 1913   APPEARANCEUR CLEAR 11/25/2015 1913   LABSPEC 1.014 11/25/2015 1913   PHURINE 6.5 11/25/2015 1913   GLUCOSEU NEGATIVE 11/25/2015 1913   HGBUR NEGATIVE 11/25/2015 1913   BILIRUBINUR NEGATIVE 11/25/2015 1913   KETONESUR NEGATIVE 11/25/2015 1913   PROTEINUR NEGATIVE 11/25/2015 1913   UROBILINOGEN 0.2 06/04/2014 1345   NITRITE NEGATIVE 11/25/2015 1913   LEUKOCYTESUR NEGATIVE 11/25/2015 1913    Radiological Exams on Admission: CT HEAD WO  CONTRAST  Result Date: 03/20/2020 CLINICAL DATA:  Fall EXAM: CT HEAD WITHOUT CONTRAST TECHNIQUE: Contiguous axial images were obtained from the base of the skull through the vertex without intravenous contrast. COMPARISON:  November 25, 2015 FINDINGS: Brain: Mild diffuse atrophy is noted. There is an acute subdural hematoma on the right extending along the anterior, temporal, parietal, and occipital regions. The maximum thickness of this right-sided subdural hematoma is measured at 5 mm. A somewhat similar acute appearing hematoma is seen on the left extending over frontal, parietal, temporal, and occipital regions with a maximum thickness of 5 mm. No appreciable midline shift evident. There is focal intra-axial hemorrhage in the left frontal region peripherally, likely due to parenchymal contusions. No other intra-axial hemorrhage is evident. There is patchy periventricular small vessel disease. No acute appearing infarct evident.  Vascular: No hyperdense vessel. There is calcification in each carotid siphon region. Skull: Bony calvarium appears intact. Sinuses/Orbits: There is opacification of the left sphenoid sinus. There is opacification of much of the right frontal sinus. There is opacification in a posterior ethmoid air cell on the left. There is a displaced fracture of the lateral right maxillary wall as well as posterior displacement of a fracture of the right maxillary antrum anteriorly. Orbits appear symmetric bilaterally. Other: Mastoid air cells are clear. IMPRESSION: 1. Acute subdural hematoma on each side with each acute subdural hematoma measuring 5 mm in thickness. These hematomas extend across frontal, temporal, parietal, and occipital regions bilaterally. No midline shift. 2. Intra-axial hemorrhage in the periphery of the posterior left frontal lobe somewhat superiorly without appreciable associated mass effect. Suspect parenchymal contusion in this area. 3.  Underlying atrophy with periventricular small vessel disease. 4. Fractures of the right lateral and anterior maxillary walls. Multiple foci of paranasal sinus opacifications. 5.  Foci of arterial vascular calcification noted. Critical Value/emergent results were called by telephone at the time of interpretation on 03/20/2020 at 4:34 pm to provider Marisa HuaMargot Venter, PA , who verbally acknowledged these results. Electronically Signed   By: Bretta BangWilliam  Woodruff III M.D.   On: 03/20/2020 16:34    EKG: Independently reviewed.  EKG showed normal sinus rhythm with PACs at 74/min  Assessment/Plan Principal Problem:   Acute subdural hematoma (HCC) Active Problems:   Dementia (HCC)   Essential hypertension   Fall at home, initial encounter   Status post fall at home Acute on chronic bilateral subdural hematoma Admit to progressive care unit with frequent neuro checks.  Repeat CT head in the morning. Obtain PT evaluation given weakness and fall.  Patient is not on any  blood thinners. Neurosurgical consult appreciated (Dr Wynetta Emeryram), no neurosurgical intervention needed at this point  Acute maxillary fracture of right lateral and anterior wall Noted on CT head.  No epistaxis.  Per ENT, Dr Elijah Birkaldwell, no additional intervention needed  Essential hypertension Blood pressure well controlled, continue amlodipine  Alzheimer dementia There is no obvious behavioral disturbances while in the hospital Patient lives at home with her niece who takes care of her, with help of home health.  Depression   Mood is stable, continue Celexa  DVT prophylaxis: None Code Status:        DNR Family Communication:  Discussed case with daughter Disposition Plan:   Patient is from:  Home  Anticipated DC to:  Home versus short-term rehab  Anticipated DC date:  24 to 48 hours  Anticipated DC barriers: Need for placement and timely PT evaluation  Consults called:   ED discussed case with neurosurgery  and ENT, with pending consult Admission status:  Admit observation  Severity of Illness: The appropriate patient status for this patient is OBSERVATION. Observation status is judged to be reasonable and necessary in order to provide the required intensity of service to ensure the patient's safety. The patient's presenting symptoms, physical exam findings, and initial radiographic and laboratory data in the context of their medical condition is felt to place them at decreased risk for further clinical deterioration. Furthermore, it is anticipated that the patient will be medically stable for discharge from the hospital within 2 midnights of admission. The following factors support the patient status of observation.   " The patient's presenting symptoms include: Dizziness, abnormal speech, incontinence " The physical exam findings include.  Ecchymosis to right eye, mild confusion " The initial radiographic and laboratory data are CT head with acute subdural hematoma and maxillary  fracture  Youlanda Roys MD [facility]  How to contact the [facility] Attending or Consulting provider 7A - 7P or covering provider during after hours 7P -7A, for this patient?   1. Check the care team in [facility] and look for a) attending/consulting [facility] provider listed and b) the [facility] team listed 2. Log into [email or web address] and use [place]'s universal password to access. If you do not have the password, please contact the hospital operator. 3. Locate the [facility] provider you are looking for under [facility] and page to a number that you can be directly reached. 4. If you still have difficulty reaching the provider, please page the [facility] (Director on Call) for the Hospitalists listed on amion for assistance.  03/20/2020, 5:46 PM

## 2020-03-20 NOTE — ED Provider Notes (Cosign Needed Addendum)
[facility] EMERGENCY DEPARTMENT Provider Note   CSN: [id number] Arrival date & time: 03/20/20  1511     History Chief Complaint  Patient presents with  . Fall    Cathy Rose is a 87 y.o. female history of Alzheimer's dementia, GERD, hypertension, kidney disease, CVA.  History obtained from patient's caregiver/daughter [name].  Patient suffered 2 falls early Sunday morning around 1 AM and 5 AM.  She was found on the floor by family unknown how this occurred.  Patient was noted to have bruising to the right side of her face.  Patient is under palliative care, they called the palliative care line and were advised to monitor the patient for mental status changes.  Daughter reports that over the past day patient has seemed more confused than normal, patient normally can hold short conversations and request her needs as well as an self.  Over the past day patient has seem much more slow will not answer questions and has had difficulty with balance and performing ADLs.  No history of fever/chills, vomiting, complaints of pain or any additional concerns.  Patient not on blood thinners  Patient reports that she is feeling well she is alert to self only, no complaint.  Level 5 caveat dementia.  HPI     Past Medical History:  Diagnosis Date  . Alzheimer's disease (HCC)   . Anxiety   . Dementia (HCC)   . Depression   . GERD (gastroesophageal reflux disease)   . Hallucinations   . Headache   . Heart beat abnormality   . Hypertension   . Kidney disease   . Osteoarthritis   . Stroke (HCC)   . TIA (transient ischemic attack)   . Vitamin B deficiency   . Vitamin D deficiency     Patient Active Problem List   Diagnosis Date Noted  . Acute subdural hematoma (HCC) 03/20/2020  . Fall at home, initial encounter 03/20/2020  . Primary osteoarthritis of left knee 09/22/2019  . Swelling of left lower extremity 09/22/2019  . Preventative health care 09/22/2019  .  Pincer nail deformity 09/06/2019  . Weakness of both lower extremities 12/29/2018  . Essential hypertension 11/20/2017  . Normochromic anemia 06/04/2014  . H/O: CVA (cerebrovascular accident) 06/04/2014  . Dementia (HCC)     History reviewed. No pertinent surgical history.   OB History   No obstetric history on file.     Family History  Problem Relation Age of Onset  . Stroke Father   . Colon cancer Sister   . Kidney disease Sister     Social History   Tobacco Use  . Smoking status: Never Smoker  . Smokeless tobacco: Never Used  Substance Use Topics  . Alcohol use: No  . Drug use: No    Home Medications Prior to Admission medications   Medication Sig Start Date End Date Taking? Authorizing Provider  amLODipine (NORVASC) 5 MG tablet TAKE 1 TABLET EVERY DAY FOR BLOOD PRESSURE 09/22/19   Doreene Nest, NP  citalopram (CELEXA) 20 MG tablet TAKE 1 TABLET EVERY DAY FOR ANXIETY 09/22/19   Doreene Nest, NP  ibuprofen (ADVIL) 200 MG tablet Take 200 mg by mouth every 4 (four) hours as needed.    [provider]    Allergies    Patient has no known allergies.  Review of Systems   Review of Systems  Unable to perform ROS: Dementia    Physical Exam Updated Vital Signs BP (!) 149/76  Pulse 82   Temp 98.8 F (37.1 C) (Oral)   Resp 17   Ht  (1.448 m)   Wt 62.1 kg   SpO2 95%   BMI 29.65 kg/m   Physical Exam Constitutional:      General: She is not in acute distress.    Appearance: Normal appearance. She is well-developed. She is not ill-appearing or diaphoretic.  HENT:     Head: Normocephalic. Contusion present.     Jaw: There is normal jaw occlusion. No trismus.      Right Ear: External ear normal. No hemotympanum.     Left Ear: External ear normal. No hemotympanum.     Nose: Nose normal. No nasal tenderness.     Right Nostril: No epistaxis.     Left Nostril: No epistaxis.     Mouth/Throat:     Mouth: Mucous membranes are moist.      Pharynx: Oropharynx is clear.     Comments: No dental injury Eyes:     General: Vision grossly intact. Gaze aligned appropriately.     Extraocular Movements: Extraocular movements intact.     Conjunctiva/sclera:     Right eye: Hemorrhage present.     Left eye: No hemorrhage.    Pupils: Pupils are equal, round, and reactive to light.     Comments: Subconjunctival hemorrhage right eye  Neck:     Trachea: Trachea and phonation normal.  Pulmonary:     Effort: Pulmonary effort is normal. No respiratory distress.     Breath sounds: Normal breath sounds and air entry.  Chest:     Chest wall: No deformity, tenderness or crepitus.  Abdominal:     General: There is no distension.     Palpations: Abdomen is soft.     Tenderness: There is no abdominal tenderness. There is no guarding or rebound.  Musculoskeletal:        General: Normal range of motion.     Cervical back: Normal range of motion and neck supple. No spinous process tenderness or muscular tenderness.     Comments: No midline C/T/L spinal tenderness to palpation, no paraspinal muscle tenderness, no deformity, crepitus, or step-off noted. No sign of injury to the neck or back.  Pelvis stable compression bilateral without pain.  Appropriate range of motion and strength of all major joints for age without pain or deformity.  Patient is able to stand and transition from wheelchair to bed with minimal assistance.  Skin:    General: Skin is warm and dry.  Neurological:     Mental Status: She is alert.     GCS: GCS eye subscore is 4. GCS verbal subscore is 5. GCS motor subscore is 6.     Comments: Speech is clear, alert to self only, follows commands Major Cranial nerves without deficit, no facial droop Moves extremities without ataxia, coordination intact  Psychiatric:        Behavior: Behavior normal.     ED Results / Procedures / Treatments   Labs (all labs ordered are listed, but only abnormal results are displayed) Labs  Reviewed  CBC - Abnormal; Notable for the following components:      Result Value   RBC 3.64 (*)    HCT 35.2 (*)    All other components within normal limits  COMPREHENSIVE METABOLIC PANEL - Abnormal; Notable for the following components:   Potassium 3.4 (*)    Glucose, Bld 118 (*)    Creatinine, Ser 1.10 (*)    GFR,  Estimated 47 (*)    All other components within normal limits  RESP PANEL BY RT-PCR (FLU A&B, COVID) ARPGX2  PROTIME-INR  APTT  DIFFERENTIAL  BASIC METABOLIC PANEL  CBC    EKG EKG Interpretation  Date/Time:  Monday March 20 2020 17:01:21 EST Ventricular Rate:  74 PR Interval:    QRS Duration: 83 QT Interval:  381 QTC Calculation: 423 R Axis:   7 Text Interpretation: Sinus rhythm Atrial premature complexes Abnormal R-wave progression, early transition Confirmed by Tilden Fossa [phone]) on 03/20/2020 5:09:57 PM   Radiology CT HEAD WO CONTRAST  Result Date: 03/20/2020 CLINICAL DATA:  Fall EXAM: CT HEAD WITHOUT CONTRAST TECHNIQUE: Contiguous axial images were obtained from the base of the skull through the vertex without intravenous contrast. COMPARISON:  November 25, 2015 FINDINGS: Brain: Mild diffuse atrophy is noted. There is an acute subdural hematoma on the right extending along the anterior, temporal, parietal, and occipital regions. The maximum thickness of this right-sided subdural hematoma is measured at 5 mm. A somewhat similar acute appearing hematoma is seen on the left extending over frontal, parietal, temporal, and occipital regions with a maximum thickness of 5 mm. No appreciable midline shift evident. There is focal intra-axial hemorrhage in the left frontal region peripherally, likely due to parenchymal contusions. No other intra-axial hemorrhage is evident. There is patchy periventricular small vessel disease. No acute appearing infarct evident. Vascular: No hyperdense vessel. There is calcification in each carotid siphon region. Skull: Bony calvarium  appears intact. Sinuses/Orbits: There is opacification of the left sphenoid sinus. There is opacification of much of the right frontal sinus. There is opacification in a posterior ethmoid air cell on the left. There is a displaced fracture of the lateral right maxillary wall as well as posterior displacement of a fracture of the right maxillary antrum anteriorly. Orbits appear symmetric bilaterally. Other: Mastoid air cells are clear. IMPRESSION: 1. Acute subdural hematoma on each side with each acute subdural hematoma measuring 5 mm in thickness. These hematomas extend across frontal, temporal, parietal, and occipital regions bilaterally. No midline shift. 2. Intra-axial hemorrhage in the periphery of the posterior left frontal lobe somewhat superiorly without appreciable associated mass effect. Suspect parenchymal contusion in this area. 3.  Underlying atrophy with periventricular small vessel disease. 4. Fractures of the right lateral and anterior maxillary walls. Multiple foci of paranasal sinus opacifications. 5.  Foci of arterial vascular calcification noted. Critical Value/emergent results were called by telephone at the time of interpretation on 03/20/2020 at 4:34 pm to provider Marisa Hua, PA , who verbally acknowledged these results. Electronically Signed   By: Bretta Bang III M.D.   On: 03/20/2020 16:34    Procedures .Critical Care Performed by: Bill Salinas, PA-C Authorized by: Bill Salinas, PA-C   Critical care provider statement:    Critical care time (minutes):  35   Critical care was necessary to treat or prevent imminent or life-threatening deterioration of the following conditions:  CNS failure or compromise   Critical care was time spent personally by me on the following activities:  Discussions with consultants, evaluation of patient's response to treatment, examination of patient, ordering and performing treatments and interventions, ordering and review of  laboratory studies, ordering and review of radiographic studies, pulse oximetry, re-evaluation of patient's condition, obtaining history from patient or surrogate, review of old charts and development of treatment plan with patient or surrogate     Medications Ordered in ED Medications  amLODipine (NORVASC) tablet 5 mg (has no  administration in time range)  citalopram (CELEXA) tablet 20 mg (has no administration in time range)  acetaminophen (TYLENOL) tablet 650 mg (has no administration in time range)    Or  acetaminophen (TYLENOL) suppository 650 mg (has no administration in time range)  polyethylene glycol (MIRALAX / GLYCOLAX) packet 17 g (has no administration in time range)  ondansetron (ZOFRAN) tablet 4 mg (has no administration in time range)    Or  ondansetron (ZOFRAN) injection 4 mg (has no administration in time range)  potassium chloride SA (KLOR-CON) CR tablet 40 mEq (has no administration in time range)    ED Course  I have reviewed the triage vital signs and the nursing notes.  Pertinent labs & imaging results that were available during my care of the patient were reviewed by me and considered in my medical decision making (see chart for details).  Clinical Course as of 03/20/20 1748  Mon Mar 20, 2020  1633 Received call from radiologist - pt has acute subdural hematoma bilaterally and hemorrhage in left frontal lobe as well as multiple facial fractures. Charge nurse Janan Halter made aware; pt to be moved back immediately [MV]    Clinical Course User Index [MV] Tanda Rockers, PA-C   MDM Rules/Calculators/A&P                         Additional history obtained from: 1. Nursing notes from this visit. 2. Family member, [name]. 3. Electronic medical records. -------------- I reviewed and interpreted labs which include: CBC without leukocytosis to suggest infectious process, no anemia. CMP shows no emergent electrolyte derangement, AKI, LFT elevations or gap.   Creatinine similar to prior. APTT and PT/INR within normal limits.  EKG: Sinus rhythm Atrial premature complexes Abnormal R-wave progression, early transition Confirmed by Tilden Fossa [phone]) on 03/20/2020 5:09:57 PM  CT Head:  IMPRESSION:  1. Acute subdural hematoma on each side with each acute subdural  hematoma measuring 5 mm in thickness. These hematomas extend across  frontal, temporal, parietal, and occipital regions bilaterally. No  midline shift.    2. Intra-axial hemorrhage in the periphery of the posterior left  frontal lobe somewhat superiorly without appreciable associated mass  effect. Suspect parenchymal contusion in this area.    3. Underlying atrophy with periventricular small vessel disease.    4. Fractures of the right lateral and anterior maxillary walls.  Multiple foci of paranasal sinus opacifications.    5. Foci of arterial vascular calcification noted.    ----------------------- 5:07 PM: Consult with neurosurgeon Dr. Wynetta Emery, advises admission to medicine service to a stepdown unit and repeat imaging.  5:23 PM consult with Dr. Olevia Bowens, patient accepted to medicine service ------ Patient reassessed she is resting comfortably in bed no acute distress vital signs stable.  Patient and her daughter irrisept and updated on plan of care and they are agreeable for admission. - I was asked to reach out to ENT by hospitalist team regarding the patient's maxillary wall fractures.  I spoke with Dr. Elijah Birk at [facility], ENT advises there is no intervention or further work-up indicated regarding her fractures.  Note: Portions of this report may have been transcribed using voice recognition software. Every effort was made to ensure accuracy; however, inadvertent computerized transcription errors may still be present. Final Clinical Impression(s) / ED Diagnoses Final diagnoses:  Subdural hematoma (HCC)    Rx / DC Orders ED Discharge Orders    None        [place],  Leonides Sake, PA-C 03/20/20 1749    Bill Salinas, PA-C 03/20/20 1812    Tilden Fossa, MD 03/21/20 1455

## 2020-03-20 NOTE — ED Triage Notes (Addendum)
Pt arrives here with her daughter from home with a chief complaint of an unwitnessed fall. Per family she was found on the floor at 5am and unknown how long she had laid there. She has dementia and on palliative care so they did not originally seal treatment however the family states she has been "much slower in speech and responding". Pt has bruising to right side of face and redness in her right eye.

## 2020-03-21 DIAGNOSIS — Z515 Encounter for palliative care: Secondary | ICD-10-CM | POA: Diagnosis not present

## 2020-03-21 DIAGNOSIS — Z7189 Other specified counseling: Secondary | ICD-10-CM | POA: Diagnosis not present

## 2020-03-21 DIAGNOSIS — G309 Alzheimer's disease, unspecified: Secondary | ICD-10-CM

## 2020-03-21 DIAGNOSIS — S065X0A Traumatic subdural hemorrhage without loss of consciousness, initial encounter: Secondary | ICD-10-CM | POA: Diagnosis not present

## 2020-03-21 DIAGNOSIS — S065X9A Traumatic subdural hemorrhage with loss of consciousness of unspecified duration, initial encounter: Secondary | ICD-10-CM | POA: Diagnosis not present

## 2020-03-21 DIAGNOSIS — F0280 Dementia in other diseases classified elsewhere without behavioral disturbance: Secondary | ICD-10-CM

## 2020-03-21 LAB — BASIC METABOLIC PANEL
Anion gap: 13 (ref 5–15)
BUN: 11 mg/dL (ref 8–23)
CO2: 25 mmol/L (ref 22–32)
Calcium: 9.4 mg/dL (ref 8.9–10.3)
Chloride: 104 mmol/L (ref 98–111)
Creatinine, Ser: 0.9 mg/dL (ref 0.44–1.00)
GFR, Estimated: 60 mL/min — ABNORMAL LOW (ref >60)
Glucose, Bld: 113 mg/dL — ABNORMAL HIGH (ref 70–99)
Potassium: 3.7 mmol/L (ref 3.5–5.1)
Sodium: 142 mmol/L (ref 135–145)

## 2020-03-21 LAB — CBC
HCT: 37.9 % (ref 36.0–46.0)
Hemoglobin: 12.6 g/dL (ref 12.0–15.0)
MCH: 31.9 pg (ref 26.0–34.0)
MCHC: 33.2 g/dL (ref 30.0–36.0)
MCV: 95.9 fL (ref 80.0–100.0)
Platelets: 239 10*3/uL (ref 150–400)
RBC: 3.95 MIL/uL (ref 3.87–5.11)
RDW: 13.1 % (ref 11.5–15.5)
WBC: 6.3 10*3/uL (ref 4.0–10.5)
nRBC: 0 % (ref 0.0–0.2)

## 2020-03-21 LAB — MRSA PCR SCREENING: MRSA by PCR: NEGATIVE

## 2020-03-21 NOTE — Care Management Obs Status (Signed)
MEDICARE OBSERVATION STATUS NOTIFICATION   Patient Details  Name: Cathy Rose MRN: [id number] Date of Birth: 04/04/1924   Medicare Observation Status Notification Given:  Yes    Kermit Balo, RN 03/21/2020, 2:33 PM

## 2020-03-21 NOTE — Progress Notes (Signed)
[facility] ([facility]) Community Based Palliative Care       This patient is enrolled in our palliative care services in the community.  [facility] will continue to follow for any discharge planning needs and to coordinate continuation of palliative care.       Thank you for the opportunity to participate in this patient's care.     Chrislyn King, BSN, RN [facility] Hospital Liaison   [phone] (24 h on call)  

## 2020-03-21 NOTE — NC FL2 (Signed)
[place] MEDICAID FL2 LEVEL OF CARE SCREENING TOOL     IDENTIFICATION  Patient Name: Cathy Rose Birthdate: [birth year] Sex: female Admission Date (Current Location): 03/20/2020  [facility] and [place] Number:  Producer, television/film/video and Address:  [facility] [place], [address] [place], [place] [address]      Provider Number: [id number]  Attending Physician Name and Address:  Lanae Boast, MD  Relative Name and Phone Number:       Current Level of Care: Hospital Recommended Level of Care: Skilled Nursing Facility Prior Approval Number:    Date Approved/Denied:   PASRR Number: [id number]  Discharge Plan: SNF    Current Diagnoses: Patient Active Problem List   Diagnosis Date Noted   Acute subdural hematoma (HCC) 03/20/2020   Fall at home, initial encounter 03/20/2020   Primary osteoarthritis of left knee 09/22/2019   Swelling of left lower extremity 09/22/2019   Preventative health care 09/22/2019   Pincer nail deformity 09/06/2019   Weakness of both lower extremities 12/29/2018   Essential hypertension 11/20/2017   Normochromic anemia 06/04/2014   H/O: CVA (cerebrovascular accident) 06/04/2014   Dementia (HCC)     Orientation RESPIRATION BLADDER Height & Weight     Self  Normal Incontinent Weight: 62.1 kg Height:  4\' 9"  (144.8 cm)  BEHAVIORAL SYMPTOMS/MOOD NEUROLOGICAL BOWEL NUTRITION STATUS      Incontinent Diet (heart healthy with thin liquids)  AMBULATORY STATUS COMMUNICATION OF NEEDS Skin   Extensive Assist Verbally  (Bruising to rt eye and abrasion to back)                       Personal Care Assistance Level of Assistance  Bathing,Feeding,Dressing Bathing Assistance: Maximum assistance Feeding assistance: Limited assistance Dressing Assistance: Maximum assistance     Functional Limitations Info  Sight,Hearing,Speech Sight Info: Impaired Hearing Info: Adequate Speech Info: Adequate    SPECIAL CARE  FACTORS FREQUENCY  PT (By licensed PT),OT (By licensed OT),Speech therapy     PT Frequency: 5x/wk OT Frequency: 5x/wk     Speech Therapy Frequency: 5x/wk      Contractures Contractures Info: Not present    Additional Factors Info  Code Status,Allergies,Psychotropic Code Status Info: DNR Allergies Info: NKA Psychotropic Info: Celexa 20 mg daily         Current Medications (03/21/2020):  This is the current hospital active medication list Current Facility-Administered Medications  Medication Dose Route Frequency Provider Last Rate Last Admin   acetaminophen (TYLENOL) tablet 650 mg  650 mg Oral Q6H PRN Poplawski, Rafal, MD       Or   acetaminophen (TYLENOL) suppository 650 mg  650 mg Rectal Q6H PRN Poplawski, Rafal, MD       amLODipine (NORVASC) tablet 5 mg  5 mg Oral Daily Poplawski, Rafal, MD   5 mg at 03/21/20 1100   citalopram (CELEXA) tablet 20 mg  20 mg Oral Daily Poplawski, Rafal, MD   20 mg at 03/21/20 1100   ondansetron (ZOFRAN) tablet 4 mg  4 mg Oral Q6H PRN Poplawski, Rafal, MD       Or   ondansetron (ZOFRAN) injection 4 mg  4 mg Intravenous Q6H PRN Poplawski, Rafal, MD       polyethylene glycol (MIRALAX / GLYCOLAX) packet 17 g  17 g Oral Daily PRN Poplawski, Rafal, MD         Discharge Medications: Please see discharge summary for a list of discharge medications.  Relevant Imaging Results:  Relevant Lab Results:   Additional Information SS#: [id number]  Kermit Balo, RN

## 2020-03-21 NOTE — Consult Note (Signed)
Consultation Note Date: 03/21/2020   Patient Name: Cathy Rose  DOB: 02-20-1924  MRN: [id number]  Age / Sex: 87 y.o., female  PCP: Pleas Koch, NP Referring Physician: Antonieta Pert, MD  Reason for Consultation: Establishing goals of care  HPI/Patient Profile: 87 y.o. female  with past medical history of advanced Alzhemier's dementia, stroke, hypertension, anxiety/depression, GERD admitted on 03/20/2020 with fall with facial trauma of acute maxillary fracture of right lateral and anterior wall and bilateral subdural hematoma.   Clinical Assessment and Goals of Care: I met today at Cathy Rose' bedside and no family present. She is sleeping soundly and did not awaken to my voice or touch of her arm. I left her to rest comfortably. RN reports that she was able to eat some of her lunch without any difficulty in swallowing noted. She is able to speak and make needs known. She was able to tell she had a bowel movement after but not before. Did not do well with activity with PT and was resistant and difficult to move.   I called and spoke with niece, [name]. [name] cares for Cathy Rose. Her goal has been to keep Cathy Rose home as long as possible. However, she was previously able to ambulate with walker (they would even go to the park and she would walk around) and was continent. [name] is hopeful that Cathy Rose can participate and improve with some rehab but knows that her dementia could be a barrier to improvement. [name] knows that Cathy Rose could also worsen. She would not want to subject Cathy Rose to surgical intervention. She has completed MOST form and will bring in for [place] to make a copy and place in records. I asked her to review MOST for any potential changes needed. If prognosis were to worsen and be short (days to weeks) then [name] would want Cathy Rose home with hospice. If Cathy Rose continues to be poorly  functional and could live even months then she will need long term placement.   [name] also expresses concern that Cathy Rose is worse today than yesterday with worsening mentation, more difficulty forming sentences, unable to manage or even know what to do with fork. [name] was under the impression that Cathy Rose was supposed to have repeat CT scan and requests repeat scan with fear of worsening condition. I have passed concern on to Dr. Lupita Leash who reports that he discussed with neurosurgery who indicates that repeat CT head is not indicated or recommended at this time (I did not share this yet with [name]).   All questions/concerns addressed. Emotional support provided.   Primary Decision Maker NEXT OF KIN niece [name]    SUMMARY OF RECOMMENDATIONS   - DNR in place - No desire for surgical intervention (even if indicated/offered) - Hopeful for rehab - Followed by [facility] palliative care - To bring MOST to place copy on shadow chart  Code Status/Advance Care Planning:  DNR   Symptom Management:   Per attending.   Palliative Prophylaxis:  Aspiration, Delirium Protocol, Frequent Pain Assessment, Oral Care and Turn Reposition  Prognosis:   Overall prognosis poor with dementia and now subdural hematoma.   Discharge Planning: [place] for rehab with Palliative care service follow-up      Primary Diagnoses: Present on Admission: . Dementia ([place]) . Essential hypertension . Acute subdural hematoma (HCC)   I have reviewed the medical record, interviewed the patient and family, and examined the patient. The following aspects are pertinent.  Past Medical History:  Diagnosis Date  . Alzheimer's disease ([place])   . Anxiety   . Dementia ([place])   . Depression   . GERD (gastroesophageal reflux disease)   . Hallucinations   . Headache   . Heart beat abnormality   . Hypertension   . Kidney disease   . Osteoarthritis   . Stroke ([place])   . TIA (transient ischemic  attack)   . Vitamin B deficiency   . Vitamin D deficiency    Social History   Socioeconomic History  . Marital status: Legally Separated    Spouse name: Not on file  . Number of children: 0  . Years of education: HS  . Highest education level: Not on file  Occupational History  . Occupation: Retired  Tobacco Use  . Smoking status: Never Smoker  . Smokeless tobacco: Never Used  Substance and Sexual Activity  . Alcohol use: No  . Drug use: No  . Sexual activity: Not on file  Other Topics Concern  . Not on file  Social History Narrative   Lives at home with her niece   Right-handed.   Occasional caffeine use.   Social Determinants of Health   Financial Resource Strain: Not on file  Food Insecurity: Not on file  Transportation Needs: Not on file  Physical Activity: Not on file  Stress: Not on file  Social Connections: Not on file   Family History  Problem Relation Age of Onset  . Stroke Father   . Colon cancer Sister   . Kidney disease Sister    Scheduled Meds: . amLODipine  5 mg Oral Daily  . citalopram  20 mg Oral Daily   Continuous Infusions: PRN Meds:.acetaminophen **OR** acetaminophen, ondansetron **OR** ondansetron (ZOFRAN) IV, polyethylene glycol No Known Allergies Review of Systems  Unable to perform ROS: Acuity of condition    Physical Exam Vitals and nursing note reviewed.  Constitutional:      General: She is sleeping.     Appearance: She is ill-appearing.  Cardiovascular:     Rate and Rhythm: Normal rate.  Pulmonary:     Effort: Pulmonary effort is normal. No tachypnea, accessory muscle usage or respiratory distress.  Abdominal:     Palpations: Abdomen is soft.  Neurological:     Comments: Sleeping soundly and comfortably, I did not awaken     Vital Signs: BP (!) 159/77 (BP Location: Left Arm)   Pulse 80   Temp 98.6 F (37 C) (Oral)   Resp 18   Ht 4' 9"  (1.448 m)   Wt 62.1 kg   SpO2 99%   BMI 29.65 kg/m  Pain Scale: Faces    Pain Score: 0-No pain   SpO2: SpO2: 99 % O2 Device:SpO2: 99 % O2 Flow Rate: .   IO: Intake/output summary:   Intake/Output Summary (Last 24 hours) at 03/21/2020 1443 Last data filed at 03/21/2020 1300 Gross per 24 hour  Intake 240 ml  Output 800 ml  Net -560 ml    LBM: Last  BM Date: 03/21/20 Baseline Weight: Weight: 62.1 kg Most recent weight: Weight: 62.1 kg     Palliative Assessment/Data:     Time In: 1400 Time Out: 1450 Time Total: 50 min Greater than 50%  of this time was spent counseling and coordinating care related to the above assessment and plan.  Signed by: Vinie Sill, NP Palliative Medicine Team Pager # [phone] (M-F 8a-5p) Team Phone # [phone] (Nights/Weekends)

## 2020-03-21 NOTE — Progress Notes (Signed)
Patient ID: Cathy Rose, female   DOB: 1924/06/30, 87 y.o.   MRN: [id number] Follow-up CT from yesterday evening is stable no neurosurgical intervention needed.

## 2020-03-21 NOTE — Evaluation (Signed)
Physical Therapy Evaluation Patient Details Name: Cathy Rose MRN: [id number] DOB: [birth year] Today's Date: 03/21/2020   History of Present Illness  87 y.o. female with medical history significant of advanced dementia due to Alzheimer disease, hypertension, anxiety/depression, gastroesophageal reflux disease, and h/o stroke presented to hospital for evaluation of traumatic fall with head injury. CT revealed bilateral SDH.    Clinical Impression  Pt admitted with above diagnosis. PTA pt lived at home with her niece, active with Palliative Care. No family present during eval and pt unable to provide history. Pt admitted to the hospital due to an unwitnessed fall at home. Pt got OOB unassisted and was found in the floor. On eval, pt required max assist bed mobility and mod assist sit to stand. Increased assist required for bed mobility due to pt resisting. RN reports pt is continually attempting to climb OOB but is requiring +2 assist from staff for purposeful mobility due to pt resistance. Pt currently with functional limitations due to the deficits listed below. Pt will benefit from skilled PT to increase their independence and safety with mobility to allow discharge home. PT to follow acutely to further assess mobility. No home services indicated but SNF would be appropriate if family unable to provide needed level of care at home. She is, however, a poor rehab candidate due to advanced dementia. Unsure of current DME in the home. Below is the recommended equipment, if not already present in the home.        Follow Up Recommendations No PT follow up;Supervision/Assistance - 24 hour    Equipment Recommendations  Hospital bed;Wheelchair (measurements PT);Wheelchair cushion (measurements PT);3in1 (PT)    Recommendations for Other Services       Precautions / Restrictions Precautions Precautions: Fall Restrictions Weight Bearing Restrictions: No      Mobility  Bed Mobility Overal  bed mobility: Needs Assistance Bed Mobility: Supine to Sit;Sit to Supine     Supine to sit: Max assist;HOB elevated Sit to supine: Max assist   General bed mobility comments: Pt unable to follow commands/directions. Initially resisting attempt to transition supine to sit, with heavy posterior lean and bilat hip extension.    Transfers Overall transfer level: Needs assistance Equipment used: 1 person hand held assist Transfers: Sit to/from Stand Sit to Stand: Mod assist         General transfer comment: After sitting EOB 5-7 minutes, able to get pt to assist with initiating sit to stand.  Ambulation/Gait Ambulation/Gait assistance: Mod assist Gait Distance (Feet): 2 Feet Assistive device: 1 person hand held assist Gait Pattern/deviations: Step-to pattern     General Gait Details: 2 sidesteps toward the right for improved positioning in bed prior to return to sitting EOB. Max multimodal cues to initiate each step. Very unsteady.  Stairs            Wheelchair Mobility    Modified Rankin (Stroke Patients Only) Modified Rankin (Stroke Patients Only) Pre-Morbid Rankin Score: Moderately severe disability Modified Rankin: Moderately severe disability     Balance Overall balance assessment: Needs assistance Sitting-balance support: No upper extremity supported;Feet supported Sitting balance-Leahy Scale: Fair Sitting balance - Comments: initially requiring mod assist to maintain EOB balance due to posterior lean but progressed to min guard for majority of sitting time (5-7 minutes).   Standing balance support: Bilateral upper extremity supported Standing balance-Leahy Scale: Poor Standing balance comment: reliant on external support  Pertinent Vitals/Pain Pain Assessment: Faces Faces Pain Scale: No hurt    Home Living Family/patient expects to be discharged to:: Private residence Living Arrangements: Other relatives  (niece) Available Help at Discharge: Family;Available 24 hours/day Type of Home: House                Prior Function Level of Independence: Needs assistance   Gait / Transfers Assistance Needed: unsure of mobility status  ADL's / Homemaking Assistance Needed: unsure of level of assist needed  Comments: Per chart, pt from home with niece. She is active with Palliative Care. No family available to provide home set up or mobility status.     Hand Dominance        Extremity/Trunk Assessment   Upper Extremity Assessment Upper Extremity Assessment: Generalized weakness    Lower Extremity Assessment Lower Extremity Assessment: Generalized weakness    Cervical / Trunk Assessment Cervical / Trunk Assessment: Kyphotic  Communication   Communication: Other (comment) (advanced dementia. Mumbling, nonsensical speech)  Cognition Arousal/Alertness: Awake/alert Behavior During Therapy: Flat affect;Impulsive Overall Cognitive Status: No family/caregiver present to determine baseline cognitive functioning                                 General Comments: h/o advanced dementia. Inconsistently following simple commands with increased time. Mumbling, nonsensical speech.      General Comments General comments (skin integrity, edema, etc.): RN reports pt continually attempting to climb OOB but requiring +2 assist for purposeful mobility with staff due to pt resisting.    Exercises     Assessment/Plan    PT Assessment Patient needs continued PT services  PT Problem List Decreased strength;Decreased cognition;Decreased balance;Decreased mobility;Decreased activity tolerance;Decreased safety awareness       PT Treatment Interventions Balance training;Functional mobility training;Gait training;Therapeutic activities;Therapeutic exercise    PT Goals (Current goals can be found in the Care Plan section)  Acute Rehab PT Goals Patient Stated Goal: unable to state PT Goal  Formulation: Patient unable to participate in goal setting Time For Goal Achievement: 04/04/20 Potential to Achieve Goals: Poor    Frequency Min 2X/week   Barriers to discharge        Co-evaluation               AM-PAC PT "6 Clicks" Mobility  Outcome Measure Help needed turning from your back to your side while in a flat bed without using bedrails?: A Little Help needed moving from lying on your back to sitting on the side of a flat bed without using bedrails?: A Lot Help needed moving to and from a bed to a chair (including a wheelchair)?: A Lot Help needed standing up from a chair using your arms (e.g., wheelchair or bedside chair)?: A Lot Help needed to walk in hospital room?: Total Help needed climbing 3-5 steps with a railing? : Total 6 Click Score: 11    End of Session   Activity Tolerance: Other (comment) (advanced dementia) Patient left: in bed;with bed alarm set Nurse Communication: Mobility status PT Visit Diagnosis: Unsteadiness on feet (R26.81);Repeated falls (R29.6);Muscle weakness (generalized) (M62.81)    Time: 9417-4081 PT Time Calculation (min) (ACUTE ONLY): 17 min   Charges:   PT Evaluation $PT Eval Moderate Complexity: 1 Mod          Aida Raider, PT  Office # [phone] Pager [phone]   Ilda Foil 03/21/2020, 12:10 PM

## 2020-03-21 NOTE — TOC Initial Note (Addendum)
Transition of Care [facility]) - Initial/Assessment Note    Patient Details  Name: Cathy Rose MRN: [id number] Date of Birth: December 03, 1924  [facility]) CM/SW Contact:    Kermit Balo, RN Phone Number: 03/21/2020, 2:21 PM  Clinical Narrative:                 Pt with dementia and confusion. CM reached out to patients niece that she lives with. [name] (niece) is interested in some rehab before returning home. Pt was able to ambulate in the home and some of her basic ADL's. [name] states she can assist her but is unable to lift the patient and she would need to be more mobile. [name] is interested in seeing if she will qualify for rehab at [facility]. FL2 completed and sent to [facility].  Pt has received both Pfizer vaccines and the booster.  TOC following.  Expected Discharge Plan: Skilled Nursing Facility Barriers to Discharge: Continued Medical Work up   Patient Goals and CMS Choice   CMS Medicare.gov Compare Post Acute Care list provided to:: Patient Represenative (must comment) Choice offered to / list presented to : [facility] POA / Guardian (niece)  Expected Discharge Plan and Services Expected Discharge Plan: Skilled Nursing Facility In-house Referral: Clinical Social Work Discharge Planning Services: CM Consult Post Acute Care Choice: Skilled Nursing Facility Living arrangements for the past 2 months: Single Family Home                                      Prior Living Arrangements/Services Living arrangements for the past 2 months: Single Family Home Lives with:: Relatives Patient language and need for interpreter reviewed:: Yes Do you feel safe going back to the place where you live?: Yes      Need for Family Participation in Patient Care: Yes (Comment) Care giver support system in place?: Yes (comment) (niece unable to provide current level of care. She is able to provide care needed if patient becomes more mobile)   Criminal Activity/Legal  Involvement Pertinent to Current Situation/Hospitalization: No - Comment as needed  Activities of Daily Living Home Assistive Devices/Equipment: None ADL Screening (condition at time of admission) Patient's cognitive ability adequate to safely complete daily activities?: No Is the patient deaf or have difficulty hearing?: No Does the patient have difficulty seeing, even when wearing glasses/contacts?: Yes Does the patient have difficulty concentrating, remembering, or making decisions?: Yes Patient able to express need for assistance with ADLs?: Yes Does the patient have difficulty dressing or bathing?: Yes Independently performs ADLs?: No Communication: Independent Dressing (OT): Needs assistance Is this a change from baseline?: Pre-admission baseline Grooming: Needs assistance Is this a change from baseline?: Pre-admission baseline Feeding: Needs assistance Is this a change from baseline?: Pre-admission baseline Bathing: Needs assistance Is this a change from baseline?: Pre-admission baseline Toileting: Needs assistance Is this a change from baseline?: Pre-admission baseline In/Out Bed: Needs assistance Is this a change from baseline?: Pre-admission baseline Does the patient have difficulty walking or climbing stairs?: Yes Weakness of Legs: Both Weakness of Arms/Hands: Both  Permission Sought/Granted                  Emotional Assessment Appearance:: Appears stated age     Orientation: : Oriented to Self   Psych Involvement: No (comment)  Admission diagnosis:  Subdural hematoma (HCC) [S06.5X9A] Acute subdural hematoma (HCC) [S06.5X9A] Patient Active Problem List  Diagnosis Date Noted  . Acute subdural hematoma (HCC) 03/20/2020  . Fall at home, initial encounter 03/20/2020  . Primary osteoarthritis of left knee 09/22/2019  . Swelling of left lower extremity 09/22/2019  . Preventative health care 09/22/2019  . Pincer nail deformity 09/06/2019  . Weakness of  both lower extremities 12/29/2018  . Essential hypertension 11/20/2017  . Normochromic anemia 06/04/2014  . H/O: CVA (cerebrovascular accident) 06/04/2014  . Dementia (HCC)    PCP:  Doreene Nest, NP Pharmacy:   [facility] [phone], [place] - [address] [place] [address] Phone: [phone] Fax: [phone]  [facility] - [place], [place] - [address] [place] [address] Phone: [phone] Fax: [phone]     Social Determinants of Health (SDOH) Interventions    Readmission Risk Interventions No flowsheet data found.

## 2020-03-21 NOTE — Progress Notes (Addendum)
PROGRESS NOTE    Cathy Rose  IOE:[id number] DOB: 12-18-1924 DOA: 03/20/2020 PCP: Doreene Nest, NP   Chief Complaint  Patient presents with  . Fall  Brief Narrative: 87 year old female with advanced dementia, hypertension anxiety/depression, GERD history of stroke brought to the ED after traumatic fall with head injury.  Patient is under palliative care at home, patient was found on the floor on Sunday morning around 130 had bruise all over her right eye.  Over the next 24 to 48 hours patient developed worsening confusion with difficulty formulating sentences, stool and urine incontinence and was having headache brought to the ED In ED-CBC showed no leukocytosis or anemia hemoglobin 12.1.  Chemistry revealed normal renal function, creatinine 1.1, with mild hypokalemia 3.4.  CT head showed acute subdural hematoma on the right and left measuring about 5 mm in thickness. CT also revealed fractures of the maxillary wall, right lateral and anterior with some mild displacement, and paranasal sinus opacification. Patient was seen by neurosurgery recommended 24 observation with repeat CT head in the morning. Patient was admitted  Subjective: This morning she is alert awake oriented to self only, not following any commands.   Assessment & Plan:  Status post fall at home Acute on chronic bilateral subdural hematoma Seen by neurosurgery repeat CT head stable no further need of CT head as per Dr. Wynetta Emery spoke with the neurosurgery this afternoon.Continue to monitor continue PT/OT.  Advanced Dementia: Continue supportive measures.  Niece has noticed decline.  Palliative care has been consulted.  Essential Hypertension:BP stable on amlodipine.  Depression:continue Celexa.  CT also revealed fractures of the maxillary wall,right lateral and anterior with some mild displacement,and paranasal sinus opacification- ENT was consulted and no intervention needed per EDP  Goal of care she is  DNR.  Palliative care consulted for ongoing discussion and further disposition  Nutrition: Diet Order            Diet Heart Room service appropriate? Yes; Fluid consistency: Thin  Diet effective now               Pt's Body mass index is 29.65 kg/m.  DVT prophylaxis: SCDs Start: 03/20/20 1745 Code Status:   Code Status: DNR  Family Communication: plan of care discussed with patient Palliative care.  Attempted to reach niece- no answer.   Status is: Observation Remains hospitalized for ongoing PT OT, observation and due to unsafe disposition Dispo: The patient is from: Home              Anticipated d/c is to: TBD              Anticipated d/c date is: 1 day              Patient currently is medically stable to d/c.   Difficult to place patient No  Consultants:see note  Procedures:see note  Unresulted Labs (From admission, onward)         None      Culture/Microbiology No results found for: SDES, SPECREQUEST, CULT, REPTSTATUS  Other culture-see note  Medications: Scheduled Meds: . amLODipine  5 mg Oral Daily  . citalopram  20 mg Oral Daily   Continuous Infusions:  Antimicrobials: Anti-infectives (From admission, onward)   None     Objective: Vitals: Today's Vitals   03/21/20 0341 03/21/20 0850 03/21/20 1100 03/21/20 1235  BP: (!) 158/78 (!) 156/70  (!) 159/77  Pulse: 90 88  80  Resp: 18 20  18   Temp: 98.7 F (37.1  C) 98.6 F (37 C)  98.6 F (37 C)  TempSrc: Oral Oral  Oral  SpO2: 100% 100%  99%  Weight:      Height:      PainSc:  0-No pain 0-No pain     Intake/Output Summary (Last 24 hours) at 03/21/2020 1448 Last data filed at 03/21/2020 1300 Gross per 24 hour  Intake 240 ml  Output 800 ml  Net -560 ml   Filed Weights   03/20/20 1526  Weight: 62.1 kg   Weight change:   Intake/Output from previous day: No intake/output data recorded. Intake/Output this shift: Total I/O In: 240 [P.O.:240] Out: 800 [Urine:800] Filed Weights   03/20/20  1526  Weight: 62.1 kg    Examination: General exam: AAOX1, ELDERLY,weak appearing. HEENT:Oral mucosa moist, Ear/Nose WNL grossly,dentition normal. Respiratory system: bilaterally diminished,no use of accessory muscle, non tender. Cardiovascular system: S1 & S2 +, regular, No JVD. Gastrointestinal system: Abdomen soft, NT,ND, BS+. Nervous System:Alert, awake oriented x1, Extremities: No edema, distal peripheral pulses palpable.  Skin: No rashes,no icterus. MSK: Normal muscle bulk,tone, power   Data Reviewed: I have personally reviewed following labs and imaging studies CBC: Recent Labs  Lab 03/20/20 1536 03/21/20 0331  WBC 6.4 6.3  NEUTROABS 4.8  --   HGB 12.1 12.6  HCT 35.2* 37.9  MCV 96.7 95.9  PLT 242 239   Basic Metabolic Panel: Recent Labs  Lab 03/20/20 1536 03/21/20 0331  NA 141 142  K 3.4* 3.7  CL 104 104  CO2 27 25  GLUCOSE 118* 113*  BUN 15 11  CREATININE 1.10* 0.90  CALCIUM 9.3 9.4   GFR: Estimated Creatinine Clearance: 29.6 mL/min (by C-G formula based on SCr of 0.9 mg/dL). Liver Function Tests: Recent Labs  Lab 03/20/20 1536  AST 18  ALT 14  ALKPHOS 96  BILITOT 0.3  PROT 6.9  ALBUMIN 4.1   No results for input(s): LIPASE, AMYLASE in the last 168 hours. No results for input(s): AMMONIA in the last 168 hours. Coagulation Profile: Recent Labs  Lab 03/20/20 1536  INR 1.0   Cardiac Enzymes: No results for input(s): CKTOTAL, CKMB, CKMBINDEX, TROPONINI in the last 168 hours. BNP (last 3 results) No results for input(s): PROBNP in the last 8760 hours. HbA1C: No results for input(s): HGBA1C in the last 72 hours. CBG: No results for input(s): GLUCAP in the last 168 hours. Lipid Profile: No results for input(s): CHOL, HDL, LDLCALC, TRIG, CHOLHDL, LDLDIRECT in the last 72 hours. Thyroid Function Tests: No results for input(s): TSH, T4TOTAL, FREET4, T3FREE, THYROIDAB in the last 72 hours. Anemia Panel: No results for input(s): VITAMINB12,  FOLATE, FERRITIN, TIBC, IRON, RETICCTPCT in the last 72 hours. Sepsis Labs: No results for input(s): PROCALCITON, LATICACIDVEN in the last 168 hours.  Recent Results (from the past 240 hour(s))  Resp Panel by RT-PCR (Flu A&B, Covid) Nasopharyngeal Swab     Status: None   Collection Time: 03/20/20  5:22 PM   Specimen: Nasopharyngeal Swab; Nasopharyngeal(NP) swabs in vial transport medium  Result Value Ref Range Status   SARS Coronavirus 2 by RT PCR NEGATIVE NEGATIVE Final    Comment: (NOTE) SARS-CoV-2 target nucleic acids are NOT DETECTED.  The SARS-CoV-2 RNA is generally detectable in upper respiratory specimens during the acute phase of infection. The lowest concentration of SARS-CoV-2 viral copies this assay can detect is 138 copies/mL. A negative result does not preclude SARS-Cov-2 infection and should not be used as the sole basis for treatment or other patient  management decisions. A negative result may occur with  improper specimen collection/handling, submission of specimen other than nasopharyngeal swab, presence of viral mutation(s) within the areas targeted by this assay, and inadequate number of viral copies(<138 copies/mL). A negative result must be combined with clinical observations, patient history, and epidemiological information. The expected result is Negative.  Fact Sheet for Patients:  BloggerCourse.com  Fact Sheet for Healthcare Providers:  SeriousBroker.it  This test is no t yet approved or cleared by the [place] FDA and  has been authorized for detection and/or diagnosis of SARS-CoV-2 by FDA under an Emergency Use Authorization (EUA). This EUA will remain  in effect (meaning this test can be used) for the duration of the COVID-19 declaration under Section 564(b)(1) of the Act, 21 U.S.C.section 360bbb-3(b)(1), unless the authorization is terminated  or revoked sooner.       Influenza A by PCR  NEGATIVE NEGATIVE Final   Influenza B by PCR NEGATIVE NEGATIVE Final    Comment: (NOTE) The Xpert Xpress SARS-CoV-2/FLU/RSV plus assay is intended as an aid in the diagnosis of influenza from Nasopharyngeal swab specimens and should not be used as a sole basis for treatment. Nasal washings and aspirates are unacceptable for Xpert Xpress SARS-CoV-2/FLU/RSV testing.  Fact Sheet for Patients: BloggerCourse.com  Fact Sheet for Healthcare Providers: SeriousBroker.it  This test is not yet approved or cleared by the [place] FDA and has been authorized for detection and/or diagnosis of SARS-CoV-2 by FDA under an Emergency Use Authorization (EUA). This EUA will remain in effect (meaning this test can be used) for the duration of the COVID-19 declaration under Section 564(b)(1) of the Act, 21 U.S.C. section 360bbb-3(b)(1), unless the authorization is terminated or revoked.  Performed at [facility], [address], [place], [place] [address]   MRSA PCR Screening     Status: None   Collection Time: 03/21/20  4:34 AM   Specimen: Nasopharyngeal  Result Value Ref Range Status   MRSA by PCR NEGATIVE NEGATIVE Final    Comment:        The GeneXpert MRSA Assay (FDA approved for NASAL specimens only), is one component of a comprehensive MRSA colonization surveillance program. It is not intended to diagnose MRSA infection nor to guide or monitor treatment for MRSA infections. Performed at [facility], [address] [place], [place] [address]      Radiology Studies: CT HEAD WO CONTRAST  Result Date: 03/20/2020 CLINICAL DATA:  Fall.  Intracranial hemorrhage follow-up EXAM: CT HEAD WITHOUT CONTRAST CT CERVICAL SPINE WITHOUT CONTRAST TECHNIQUE: Multidetector CT imaging of the head and cervical spine was performed following the standard protocol without intravenous contrast. Multiplanar CT image reconstructions of the  cervical spine were also generated. COMPARISON:  CT head 03/20/2020 approximately 2 hours prior to the current study. FINDINGS: CT HEAD FINDINGS Brain: Mixed density subdural hematomas bilaterally approximately 5 mm in thickness. These are predominately low signal but there are scattered areas of high density blood present as well. Small subarachnoid hemorrhage left frontal lobe appears unchanged. No new hemorrhage Generalized atrophy. No midline shift. No acute infarct. Mild chronic ischemic change in the white matter. Vascular: Negative for hyperdense vessel Skull: Negative Sinuses/Orbits: Near complete opacification right maxillary sinus with air-fluid level. Near complete opacification left sphenoid sinus. Bilateral cataract extraction Other: None CT CERVICAL SPINE FINDINGS Alignment: 2 mm anterolisthesis C3-4. 3 mm anterolisthesis C4-5. 2 mm anterolisthesis C5-6. Skull base and vertebrae: Negative for fracture Soft tissues and spinal canal: Negative  Disc levels: Multilevel disc and facet degeneration. Negative for spinal stenosis. Upper chest: Lung apices clear bilaterally. Other: None IMPRESSION: Mixed density subdural hematoma bilaterally, unchanged from earlier today. Small volume subarachnoid hemorrhage left frontal lobe unchanged. No new hemorrhage since the study earlier today. Negative for cervical spine fracture. Electronically Signed   By: Marlan Palauharles  Clark M.D.   On: 03/20/2020 18:55   CT HEAD WO CONTRAST  Result Date: 03/20/2020 CLINICAL DATA:  Fall EXAM: CT HEAD WITHOUT CONTRAST TECHNIQUE: Contiguous axial images were obtained from the base of the skull through the vertex without intravenous contrast. COMPARISON:  November 25, 2015 FINDINGS: Brain: Mild diffuse atrophy is noted. There is an acute subdural hematoma on the right extending along the anterior, temporal, parietal, and occipital regions. The maximum thickness of this right-sided subdural hematoma is measured at 5 mm. A somewhat similar  acute appearing hematoma is seen on the left extending over frontal, parietal, temporal, and occipital regions with a maximum thickness of 5 mm. No appreciable midline shift evident. There is focal intra-axial hemorrhage in the left frontal region peripherally, likely due to parenchymal contusions. No other intra-axial hemorrhage is evident. There is patchy periventricular small vessel disease. No acute appearing infarct evident. Vascular: No hyperdense vessel. There is calcification in each carotid siphon region. Skull: Bony calvarium appears intact. Sinuses/Orbits: There is opacification of the left sphenoid sinus. There is opacification of much of the right frontal sinus. There is opacification in a posterior ethmoid air cell on the left. There is a displaced fracture of the lateral right maxillary wall as well as posterior displacement of a fracture of the right maxillary antrum anteriorly. Orbits appear symmetric bilaterally. Other: Mastoid air cells are clear. IMPRESSION: 1. Acute subdural hematoma on each side with each acute subdural hematoma measuring 5 mm in thickness. These hematomas extend across frontal, temporal, parietal, and occipital regions bilaterally. No midline shift. 2. Intra-axial hemorrhage in the periphery of the posterior left frontal lobe somewhat superiorly without appreciable associated mass effect. Suspect parenchymal contusion in this area. 3.  Underlying atrophy with periventricular small vessel disease. 4. Fractures of the right lateral and anterior maxillary walls. Multiple foci of paranasal sinus opacifications. 5.  Foci of arterial vascular calcification noted. Critical Value/emergent results were called by telephone at the time of interpretation on 03/20/2020 at 4:34 pm to provider Marisa HuaMargot Venter, PA , who verbally acknowledged these results. Electronically Signed   By: Bretta BangWilliam  Woodruff III M.D.   On: 03/20/2020 16:34   CT Cervical Spine Wo Contrast  Result Date:  03/20/2020 CLINICAL DATA:  Fall.  Intracranial hemorrhage follow-up EXAM: CT HEAD WITHOUT CONTRAST CT CERVICAL SPINE WITHOUT CONTRAST TECHNIQUE: Multidetector CT imaging of the head and cervical spine was performed following the standard protocol without intravenous contrast. Multiplanar CT image reconstructions of the cervical spine were also generated. COMPARISON:  CT head 03/20/2020 approximately 2 hours prior to the current study. FINDINGS: CT HEAD FINDINGS Brain: Mixed density subdural hematomas bilaterally approximately 5 mm in thickness. These are predominately low signal but there are scattered areas of high density blood present as well. Small subarachnoid hemorrhage left frontal lobe appears unchanged. No new hemorrhage Generalized atrophy. No midline shift. No acute infarct. Mild chronic ischemic change in the white matter. Vascular: Negative for hyperdense vessel Skull: Negative Sinuses/Orbits: Near complete opacification right maxillary sinus with air-fluid level. Near complete opacification left sphenoid sinus. Bilateral cataract extraction Other: None CT CERVICAL SPINE FINDINGS Alignment: 2 mm anterolisthesis C3-4. 3 mm anterolisthesis C4-5. 2  mm anterolisthesis C5-6. Skull base and vertebrae: Negative for fracture Soft tissues and spinal canal: Negative Disc levels: Multilevel disc and facet degeneration. Negative for spinal stenosis. Upper chest: Lung apices clear bilaterally. Other: None IMPRESSION: Mixed density subdural hematoma bilaterally, unchanged from earlier today. Small volume subarachnoid hemorrhage left frontal lobe unchanged. No new hemorrhage since the study earlier today. Negative for cervical spine fracture. Electronically Signed   By: Marlan Palau M.D.   On: 03/20/2020 18:55     LOS: 0 days   Lanae Boast, MD [facility]  03/21/2020, 2:48 PM

## 2020-03-21 NOTE — Plan of Care (Signed)

## 2020-03-22 DIAGNOSIS — Z7189 Other specified counseling: Secondary | ICD-10-CM | POA: Diagnosis not present

## 2020-03-22 DIAGNOSIS — F0280 Dementia in other diseases classified elsewhere without behavioral disturbance: Secondary | ICD-10-CM | POA: Diagnosis not present

## 2020-03-22 DIAGNOSIS — Z515 Encounter for palliative care: Secondary | ICD-10-CM | POA: Diagnosis not present

## 2020-03-22 DIAGNOSIS — G309 Alzheimer's disease, unspecified: Secondary | ICD-10-CM | POA: Diagnosis not present

## 2020-03-22 DIAGNOSIS — S065X9A Traumatic subdural hemorrhage with loss of consciousness of unspecified duration, initial encounter: Secondary | ICD-10-CM | POA: Diagnosis not present

## 2020-03-22 DIAGNOSIS — S065X0A Traumatic subdural hemorrhage without loss of consciousness, initial encounter: Secondary | ICD-10-CM | POA: Diagnosis not present

## 2020-03-22 LAB — URINALYSIS, ROUTINE W REFLEX MICROSCOPIC
Bilirubin Urine: NEGATIVE
Glucose, UA: NEGATIVE mg/dL
Hgb urine dipstick: NEGATIVE
Ketones, ur: 5 mg/dL — AB
Leukocytes,Ua: NEGATIVE
Nitrite: NEGATIVE
Protein, ur: NEGATIVE mg/dL
Specific Gravity, Urine: 1.012 (ref 1.005–1.030)
pH: 8 (ref 5.0–8.0)

## 2020-03-22 NOTE — Progress Notes (Signed)
Palliative:  HPI: 87 y.o. female  with past medical history of advanced Alzhemier's dementia, stroke, hypertension, anxiety/depression, GERD admitted on 03/20/2020 with fall with facial trauma of acute maxillary fracture of right lateral and anterior wall and bilateral subdural hematoma.   I met today at Cathy Rose' bedside. No family present. She is awake and alert with appropriate responses but with underlying confusion related to dementia. She denies pain. She is very pleasant and even tells me to have a good day when I leave. I pointed out to her that her television was on and when I left she was resting comfortably watching television.   I called and spoke with HCPOA, Cathy Rose. Cathy Rose shares that she is pleased with Cathy Rose' condition today and feels she is better than she was yesterday. Plans for transition to [facility] for rehab. Cathy Rose is hopeful that she will have improvement in rehab. Cathy Rose also understands that her dementia could be a barrier to improvement. I encouraged Cathy Rose to follow up with CSW at SNF to begin process for long term care Medicaid in case of needing care past rehab which is likely. Goals are clear.   All questions/concerns addressed. Emotional support provided.   Exam: Alert, oriented x 1. No distress. Able to track and answer simple questions appropriately. Breathing regular, unlabored. Abd soft. Generalized weakness/fatigue.   Plan: - SNF rehab with palliative to follow.   25 min  Vinie Sill, NP Palliative Medicine Team Pager [phone] (Please see [email or web address] for schedule) Team Phone [phone]    Greater than 50%  of this time was spent counseling and coordinating care related to the above assessment and plan

## 2020-03-22 NOTE — Progress Notes (Signed)
PROGRESS NOTE    Cathy Rose  NWG:[id number] DOB: May 22, 1924 DOA: 03/20/2020 PCP: Doreene Nest, NP   Chief Complaint  Patient presents with  . Fall  Brief Narrative: 87 year old female with advanced dementia, hypertension anxiety/depression, GERD history of stroke brought to the ED after traumatic fall with head injury.  Patient is under palliative care at home, patient was found on the floor on Sunday morning around 130 had bruise all over her right eye.  Over the next 24 to 48 hours patient developed worsening confusion with difficulty formulating sentences, stool and urine incontinence and was having headache brought to the ED In ED-CBC showed no leukocytosis or anemia hemoglobin 12.1.  Chemistry revealed normal renal function, creatinine 1.1, with mild hypokalemia 3.4.  CT head showed acute subdural hematoma on the right and left measuring about 5 mm in thickness. CT also revealed fractures of the maxillary wall, right lateral and anterior with some mild displacement, and paranasal sinus opacification. Patient was seen by neurosurgery recommended 24 observation with repeat CT head in the morning. Patient was admitted Seen by neurosurgery repeat CT head stable no further need for CT head or any intervention as per CT surgery Seen by PT OT recommending skilled nursing facility  Subjective: Seen and examined this morning.   Low-grade fever yesterday evening 100.8 but per RN patient had multiple blankets/room was warm    Assessment & Plan:  Status post fall at home Acute on chronic bilateral subdural hematoma Seen by neurosurgery repeat CT head stable no further need of CT head as per Dr. Wynetta Emery- I communicated with him.  Continue PT OT and rehabilitation.   Low-grade temp 2/22 pm: no recurrence loss likely from external heat.  But checking UA to make sure there is no UTI  Advanced Dementia: Alert awake oriented x1.  Has had recent decline.  Continue PT OT May need skilled  nursing facility disposition.   Essential Hypertension:BP controlled on amlodipine.    Depression:continue Celexa.  CT also revealed fractures of the maxillary wall,right lateral and anterior with some mild displacement,and paranasal sinus opacification- ENT was consulted by ED and no acute intervention needed.  Outpatient follow-up.    Goal of care she is DNR.  Palliative care consulted for ongoing discussion and further disposition At this time plan is for skilled nursing facility disposition,awaiting for bed. If patient declines niece is open to hospice in future.  Nutrition: Diet Order            Diet Heart Room service appropriate? Yes; Fluid consistency: Thin  Diet effective now               Pt's Body mass index is 29.65 kg/m.  DVT prophylaxis: SCDs Start: 03/20/20 1745 Code Status:   Code Status: DNR  Family Communication: plan of care discussed with nursing staff and palliative care.  Palliative care has been updating/discussing with patient's niece  Status is: admitted as observation Remains hospitalized for ongoing PT OT and pending SNF.  Dispo: The patient is from: Home              Anticipated d/c is to: SNF              Anticipated d/c date is: Once bed available.              Patient currently is medically stable to d/c.   Difficult to place patient No  Consultants:see note  Procedures:see note  Unresulted Labs (From admission, onward)  Start     Ordered   03/22/20 0741  Culture, Urine  Once,   R        03/22/20 0740   03/22/20 0740  Urinalysis, Routine w reflex microscopic Urine, Clean Catch  ONCE - STAT,   STAT        03/22/20 0740          Culture/Microbiology No results found for: SDES, SPECREQUEST, CULT, REPTSTATUS  Other culture-see note  Medications: Scheduled Meds: . amLODipine  5 mg Oral Daily  . citalopram  20 mg Oral Daily   Continuous Infusions:  Antimicrobials: Anti-infectives (From admission, onward)   None      Objective: Vitals: Today's Vitals   03/22/20 0333 03/22/20 0700 03/22/20 0855 03/22/20 1133  BP: (!) 146/79 (!) 148/68  134/85  Pulse: 73 79  69  Resp: 17 18  18   Temp: 98.7 F (37.1 C) 98.8 F (37.1 C)    TempSrc: Oral Oral    SpO2: 100% 100%  100%  Weight:      Height:      PainSc:   0-No pain     Intake/Output Summary (Last 24 hours) at 03/22/2020 1401 Last data filed at 03/22/2020 1300 Gross per 24 hour  Intake 480 ml  Output 1100 ml  Net -620 ml   Filed Weights   03/20/20 1526  Weight: 62.1 kg   Weight change:   Intake/Output from previous day: 02/22 0701 - 02/23 0700 In: 240 [P.O.:240] Out: 800 [Urine:800] Intake/Output this shift: Total I/O In: 480 [P.O.:480] Out: 1100 [Urine:1100] Filed Weights   03/20/20 1526  Weight: 62.1 kg    Examination: General exam: AAOX1, elderly,weak appearing. HEENT:Oral mucosa moist, Ear/Nose WNL grossly, dentition normal. Bruise on face Respiratory system: bilaterally clear,no wheezing or crackles,no use of accessory muscle Cardiovascular system: S1 & S2 +, No JVD,. Gastrointestinal system: Abdomen soft, NT,ND, BS+ Nervous System:Alert, awake, moving extremities and grossly nonfocal Extremities: No edema, distal peripheral pulses palpable.  Skin: No rashes,no icterus. MSK: Normal muscle bulk,tone, power  Data Reviewed: I have personally reviewed following labs and imaging studies CBC: Recent Labs  Lab 03/20/20 1536 03/21/20 0331  WBC 6.4 6.3  NEUTROABS 4.8  --   HGB 12.1 12.6  HCT 35.2* 37.9  MCV 96.7 95.9  PLT 242 239   Basic Metabolic Panel: Recent Labs  Lab 03/20/20 1536 03/21/20 0331  NA 141 142  K 3.4* 3.7  CL 104 104  CO2 27 25  GLUCOSE 118* 113*  BUN 15 11  CREATININE 1.10* 0.90  CALCIUM 9.3 9.4   GFR: Estimated Creatinine Clearance: 29.6 mL/min (by C-G formula based on SCr of 0.9 mg/dL). Liver Function Tests: Recent Labs  Lab 03/20/20 1536  AST 18  ALT 14  ALKPHOS 96  BILITOT 0.3   PROT 6.9  ALBUMIN 4.1   No results for input(s): LIPASE, AMYLASE in the last 168 hours. No results for input(s): AMMONIA in the last 168 hours. Coagulation Profile: Recent Labs  Lab 03/20/20 1536  INR 1.0   Cardiac Enzymes: No results for input(s): CKTOTAL, CKMB, CKMBINDEX, TROPONINI in the last 168 hours. BNP (last 3 results) No results for input(s): PROBNP in the last 8760 hours. HbA1C: No results for input(s): HGBA1C in the last 72 hours. CBG: No results for input(s): GLUCAP in the last 168 hours. Lipid Profile: No results for input(s): CHOL, HDL, LDLCALC, TRIG, CHOLHDL, LDLDIRECT in the last 72 hours. Thyroid Function Tests: No results for input(s): TSH, T4TOTAL, FREET4,  T3FREE, THYROIDAB in the last 72 hours. Anemia Panel: No results for input(s): VITAMINB12, FOLATE, FERRITIN, TIBC, IRON, RETICCTPCT in the last 72 hours. Sepsis Labs: No results for input(s): PROCALCITON, LATICACIDVEN in the last 168 hours.  Recent Results (from the past 240 hour(s))  Resp Panel by RT-PCR (Flu A&B, Covid) Nasopharyngeal Swab     Status: None   Collection Time: 03/20/20  5:22 PM   Specimen: Nasopharyngeal Swab; Nasopharyngeal(NP) swabs in vial transport medium  Result Value Ref Range Status   SARS Coronavirus 2 by RT PCR NEGATIVE NEGATIVE Final    Comment: (NOTE) SARS-CoV-2 target nucleic acids are NOT DETECTED.  The SARS-CoV-2 RNA is generally detectable in upper respiratory specimens during the acute phase of infection. The lowest concentration of SARS-CoV-2 viral copies this assay can detect is 138 copies/mL. A negative result does not preclude SARS-Cov-2 infection and should not be used as the sole basis for treatment or other patient management decisions. A negative result may occur with  improper specimen collection/handling, submission of specimen other than nasopharyngeal swab, presence of viral mutation(s) within the areas targeted by this assay, and inadequate number of  viral copies(<138 copies/mL). A negative result must be combined with clinical observations, patient history, and epidemiological information. The expected result is Negative.  Fact Sheet for Patients:  BloggerCourse.comhttps://www.fda.gov/media/152166/download  Fact Sheet for Healthcare Providers:  SeriousBroker.ithttps://www.fda.gov/media/152162/download  This test is no t yet approved or cleared by the [place] FDA and  has been authorized for detection and/or diagnosis of SARS-CoV-2 by FDA under an Emergency Use Authorization (EUA). This EUA will remain  in effect (meaning this test can be used) for the duration of the COVID-19 declaration under Section 564(b)(1) of the Act, 21 U.S.C.section 360bbb-3(b)(1), unless the authorization is terminated  or revoked sooner.       Influenza A by PCR NEGATIVE NEGATIVE Final   Influenza B by PCR NEGATIVE NEGATIVE Final    Comment: (NOTE) The Xpert Xpress SARS-CoV-2/FLU/RSV plus assay is intended as an aid in the diagnosis of influenza from Nasopharyngeal swab specimens and should not be used as a sole basis for treatment. Nasal washings and aspirates are unacceptable for Xpert Xpress SARS-CoV-2/FLU/RSV testing.  Fact Sheet for Patients: BloggerCourse.comhttps://www.fda.gov/media/152166/download  Fact Sheet for Healthcare Providers: SeriousBroker.ithttps://www.fda.gov/media/152162/download  This test is not yet approved or cleared by the [place] FDA and has been authorized for detection and/or diagnosis of SARS-CoV-2 by FDA under an Emergency Use Authorization (EUA). This EUA will remain in effect (meaning this test can be used) for the duration of the COVID-19 declaration under Section 564(b)(1) of the Act, 21 U.S.C. section 360bbb-3(b)(1), unless the authorization is terminated or revoked.  Performed at [facility], [address] [place], [place] [address]   MRSA PCR Screening     Status: None   Collection Time: 03/21/20  4:34 AM   Specimen: Nasopharyngeal  Result  Value Ref Range Status   MRSA by PCR NEGATIVE NEGATIVE Final    Comment:        The GeneXpert MRSA Assay (FDA approved for NASAL specimens only), is one component of a comprehensive MRSA colonization surveillance program. It is not intended to diagnose MRSA infection nor to guide or monitor treatment for MRSA infections. Performed at [facility], [address] [place], [place] [address]      Radiology Studies: CT HEAD WO CONTRAST  Result Date: 03/20/2020 CLINICAL DATA:  Fall.  Intracranial hemorrhage follow-up EXAM: CT HEAD WITHOUT CONTRAST CT CERVICAL SPINE  WITHOUT CONTRAST TECHNIQUE: Multidetector CT imaging of the head and cervical spine was performed following the standard protocol without intravenous contrast. Multiplanar CT image reconstructions of the cervical spine were also generated. COMPARISON:  CT head 03/20/2020 approximately 2 hours prior to the current study. FINDINGS: CT HEAD FINDINGS Brain: Mixed density subdural hematomas bilaterally approximately 5 mm in thickness. These are predominately low signal but there are scattered areas of high density blood present as well. Small subarachnoid hemorrhage left frontal lobe appears unchanged. No new hemorrhage Generalized atrophy. No midline shift. No acute infarct. Mild chronic ischemic change in the white matter. Vascular: Negative for hyperdense vessel Skull: Negative Sinuses/Orbits: Near complete opacification right maxillary sinus with air-fluid level. Near complete opacification left sphenoid sinus. Bilateral cataract extraction Other: None CT CERVICAL SPINE FINDINGS Alignment: 2 mm anterolisthesis C3-4. 3 mm anterolisthesis C4-5. 2 mm anterolisthesis C5-6. Skull base and vertebrae: Negative for fracture Soft tissues and spinal canal: Negative Disc levels: Multilevel disc and facet degeneration. Negative for spinal stenosis. Upper chest: Lung apices clear bilaterally. Other: None IMPRESSION: Mixed density subdural hematoma  bilaterally, unchanged from earlier today. Small volume subarachnoid hemorrhage left frontal lobe unchanged. No new hemorrhage since the study earlier today. Negative for cervical spine fracture. Electronically Signed   By: Marlan Palau M.D.   On: 03/20/2020 18:55   CT HEAD WO CONTRAST  Result Date: 03/20/2020 CLINICAL DATA:  Fall EXAM: CT HEAD WITHOUT CONTRAST TECHNIQUE: Contiguous axial images were obtained from the base of the skull through the vertex without intravenous contrast. COMPARISON:  November 25, 2015 FINDINGS: Brain: Mild diffuse atrophy is noted. There is an acute subdural hematoma on the right extending along the anterior, temporal, parietal, and occipital regions. The maximum thickness of this right-sided subdural hematoma is measured at 5 mm. A somewhat similar acute appearing hematoma is seen on the left extending over frontal, parietal, temporal, and occipital regions with a maximum thickness of 5 mm. No appreciable midline shift evident. There is focal intra-axial hemorrhage in the left frontal region peripherally, likely due to parenchymal contusions. No other intra-axial hemorrhage is evident. There is patchy periventricular small vessel disease. No acute appearing infarct evident. Vascular: No hyperdense vessel. There is calcification in each carotid siphon region. Skull: Bony calvarium appears intact. Sinuses/Orbits: There is opacification of the left sphenoid sinus. There is opacification of much of the right frontal sinus. There is opacification in a posterior ethmoid air cell on the left. There is a displaced fracture of the lateral right maxillary wall as well as posterior displacement of a fracture of the right maxillary antrum anteriorly. Orbits appear symmetric bilaterally. Other: Mastoid air cells are clear. IMPRESSION: 1. Acute subdural hematoma on each side with each acute subdural hematoma measuring 5 mm in thickness. These hematomas extend across frontal, temporal, parietal,  and occipital regions bilaterally. No midline shift. 2. Intra-axial hemorrhage in the periphery of the posterior left frontal lobe somewhat superiorly without appreciable associated mass effect. Suspect parenchymal contusion in this area. 3.  Underlying atrophy with periventricular small vessel disease. 4. Fractures of the right lateral and anterior maxillary walls. Multiple foci of paranasal sinus opacifications. 5.  Foci of arterial vascular calcification noted. Critical Value/emergent results were called by telephone at the time of interpretation on 03/20/2020 at 4:34 pm to provider Marisa Hua, PA , who verbally acknowledged these results. Electronically Signed   By: Bretta Bang III M.D.   On: 03/20/2020 16:34   CT Cervical Spine Wo Contrast  Result Date: 03/20/2020  CLINICAL DATA:  Fall.  Intracranial hemorrhage follow-up EXAM: CT HEAD WITHOUT CONTRAST CT CERVICAL SPINE WITHOUT CONTRAST TECHNIQUE: Multidetector CT imaging of the head and cervical spine was performed following the standard protocol without intravenous contrast. Multiplanar CT image reconstructions of the cervical spine were also generated. COMPARISON:  CT head 03/20/2020 approximately 2 hours prior to the current study. FINDINGS: CT HEAD FINDINGS Brain: Mixed density subdural hematomas bilaterally approximately 5 mm in thickness. These are predominately low signal but there are scattered areas of high density blood present as well. Small subarachnoid hemorrhage left frontal lobe appears unchanged. No new hemorrhage Generalized atrophy. No midline shift. No acute infarct. Mild chronic ischemic change in the white matter. Vascular: Negative for hyperdense vessel Skull: Negative Sinuses/Orbits: Near complete opacification right maxillary sinus with air-fluid level. Near complete opacification left sphenoid sinus. Bilateral cataract extraction Other: None CT CERVICAL SPINE FINDINGS Alignment: 2 mm anterolisthesis C3-4. 3 mm anterolisthesis  C4-5. 2 mm anterolisthesis C5-6. Skull base and vertebrae: Negative for fracture Soft tissues and spinal canal: Negative Disc levels: Multilevel disc and facet degeneration. Negative for spinal stenosis. Upper chest: Lung apices clear bilaterally. Other: None IMPRESSION: Mixed density subdural hematoma bilaterally, unchanged from earlier today. Small volume subarachnoid hemorrhage left frontal lobe unchanged. No new hemorrhage since the study earlier today. Negative for cervical spine fracture. Electronically Signed   By: Marlan Palau M.D.   On: 03/20/2020 18:55     LOS: 0 days   Lanae Boast, MD [facility]  03/22/2020, 2:01 PM

## 2020-03-22 NOTE — Plan of Care (Signed)
  Problem: Pain Managment: Goal: General experience of comfort will improve Outcome: Progressing   Problem: Safety: Goal: Ability to remain free from injury will improve Outcome: Progressing   Problem: Skin Integrity: Goal: Risk for impaired skin integrity will decrease Outcome: Progressing   

## 2020-03-22 NOTE — TOC Progression Note (Signed)
Transition of Care [facility]) - Progression Note    Patient Details  Name: Cathy Rose MRN: [id number] Date of Birth: 12/14/1924  [facility] CM/SW Contact  Kermit Balo, RN Phone Number: 03/22/2020, 11:19 AM  Clinical Narrative:    [facility] offered a bed and will begin insurance auth for SNF rehab. Cm has updated the patients niece.  TOC following.   Expected Discharge Plan: Skilled Nursing Facility Barriers to Discharge: Continued Medical Work up  Expected Discharge Plan and Services Expected Discharge Plan: Skilled Nursing Facility In-house Referral: Clinical Social Work Discharge Planning Services: CM Consult Post Acute Care Choice: Skilled Nursing Facility Living arrangements for the past 2 months: Single Family Home                                       Social Determinants of Health (SDOH) Interventions    Readmission Risk Interventions No flowsheet data found.

## 2020-03-23 ENCOUNTER — Telehealth: Payer: Self-pay

## 2020-03-23 DIAGNOSIS — R1312 Dysphagia, oropharyngeal phase: Secondary | ICD-10-CM | POA: Diagnosis not present

## 2020-03-23 DIAGNOSIS — F0390 Unspecified dementia without behavioral disturbance: Secondary | ICD-10-CM | POA: Diagnosis not present

## 2020-03-23 DIAGNOSIS — F339 Major depressive disorder, recurrent, unspecified: Secondary | ICD-10-CM | POA: Diagnosis not present

## 2020-03-23 DIAGNOSIS — S0240CA Maxillary fracture, right side, initial encounter for closed fracture: Secondary | ICD-10-CM | POA: Diagnosis not present

## 2020-03-23 DIAGNOSIS — M1990 Unspecified osteoarthritis, unspecified site: Secondary | ICD-10-CM | POA: Diagnosis not present

## 2020-03-23 DIAGNOSIS — R531 Weakness: Secondary | ICD-10-CM | POA: Diagnosis not present

## 2020-03-23 DIAGNOSIS — N189 Chronic kidney disease, unspecified: Secondary | ICD-10-CM | POA: Diagnosis not present

## 2020-03-23 DIAGNOSIS — R488 Other symbolic dysfunctions: Secondary | ICD-10-CM | POA: Diagnosis not present

## 2020-03-23 DIAGNOSIS — F329 Major depressive disorder, single episode, unspecified: Secondary | ICD-10-CM | POA: Diagnosis not present

## 2020-03-23 DIAGNOSIS — I10 Essential (primary) hypertension: Secondary | ICD-10-CM | POA: Diagnosis not present

## 2020-03-23 DIAGNOSIS — S065X9A Traumatic subdural hemorrhage with loss of consciousness of unspecified duration, initial encounter: Secondary | ICD-10-CM | POA: Diagnosis not present

## 2020-03-23 DIAGNOSIS — R5381 Other malaise: Secondary | ICD-10-CM | POA: Diagnosis not present

## 2020-03-23 DIAGNOSIS — R2681 Unsteadiness on feet: Secondary | ICD-10-CM | POA: Diagnosis not present

## 2020-03-23 DIAGNOSIS — S065X0A Traumatic subdural hemorrhage without loss of consciousness, initial encounter: Secondary | ICD-10-CM | POA: Diagnosis not present

## 2020-03-23 DIAGNOSIS — Z7401 Bed confinement status: Secondary | ICD-10-CM | POA: Diagnosis not present

## 2020-03-23 DIAGNOSIS — F411 Generalized anxiety disorder: Secondary | ICD-10-CM | POA: Diagnosis not present

## 2020-03-23 DIAGNOSIS — Z4789 Encounter for other orthopedic aftercare: Secondary | ICD-10-CM | POA: Diagnosis not present

## 2020-03-23 DIAGNOSIS — M6281 Muscle weakness (generalized): Secondary | ICD-10-CM | POA: Diagnosis not present

## 2020-03-23 DIAGNOSIS — W19XXXA Unspecified fall, initial encounter: Secondary | ICD-10-CM | POA: Diagnosis not present

## 2020-03-23 DIAGNOSIS — M2550 Pain in unspecified joint: Secondary | ICD-10-CM | POA: Diagnosis not present

## 2020-03-23 DIAGNOSIS — Z20822 Contact with and (suspected) exposure to covid-19: Secondary | ICD-10-CM | POA: Diagnosis not present

## 2020-03-23 DIAGNOSIS — Z515 Encounter for palliative care: Secondary | ICD-10-CM | POA: Diagnosis not present

## 2020-03-23 DIAGNOSIS — F32A Depression, unspecified: Secondary | ICD-10-CM | POA: Diagnosis not present

## 2020-03-23 DIAGNOSIS — G309 Alzheimer's disease, unspecified: Secondary | ICD-10-CM | POA: Diagnosis not present

## 2020-03-23 DIAGNOSIS — Z79899 Other long term (current) drug therapy: Secondary | ICD-10-CM | POA: Diagnosis not present

## 2020-03-23 DIAGNOSIS — F0391 Unspecified dementia with behavioral disturbance: Secondary | ICD-10-CM | POA: Diagnosis not present

## 2020-03-23 DIAGNOSIS — K219 Gastro-esophageal reflux disease without esophagitis: Secondary | ICD-10-CM | POA: Diagnosis not present

## 2020-03-23 DIAGNOSIS — Z8673 Personal history of transient ischemic attack (TIA), and cerebral infarction without residual deficits: Secondary | ICD-10-CM | POA: Diagnosis not present

## 2020-03-23 LAB — URINE CULTURE: Culture: <10000 — AB

## 2020-03-23 LAB — SARS CORONAVIRUS 2 (TAT 6-24 HRS): SARS Coronavirus 2: NEGATIVE

## 2020-03-23 MED ORDER — POLYETHYLENE GLYCOL 3350 17 G PO PACK: 17.0000 g | PACK | Freq: Every day | ORAL | 0 refills | Status: AC | PRN

## 2020-03-23 NOTE — Progress Notes (Signed)
Call [facility] 3 times to attempt to give report and I was transferred each time and after several rings, disconnected.  That happened 3 times so I called back a 4th time and receptionist Toniann Fail, who answered each call and was the one to transfer me each time, said she will have someone to call me back.  I did explain that she is scheduled for a 12:00 PM pick up and I did not want her to get there without the nurse first receiving report, and she said she understood and would pass the message and have someone call me back.

## 2020-03-23 NOTE — Telephone Encounter (Signed)
FYI [name] just wanted to call to let you know that pt has been released from the hospital and is now at [facility] for [employer]

## 2020-03-23 NOTE — Discharge Summary (Signed)
Physician Discharge Summary  Lynetta Tomczak UJW:[id number] DOB: 11-20-1924 DOA: 03/20/2020  PCP: Doreene Nest, NP  Admit date: 03/20/2020 Discharge date: 03/23/2020  Admitted From: home Disposition:  SNF  Recommendations for Outpatient Follow-up:  1. Follow up with PCP in 1-2 weeks 2. Please obtain BMP/CBC in one week 3. Please follow up on the following pending results:  Home Health:NO  Equipment/Devices: NONE  Discharge Condition: Stable Code Status:   Code Status: DNR Diet recommendation:  Diet Order            Diet - low sodium heart healthy           Diet Heart Room service appropriate? Yes; Fluid consistency: Thin  Diet effective now                  Brief/Interim Summary: 87 year old female with advanced dementia, hypertension anxiety/depression, GERD history of stroke brought to the ED after traumatic fall with head injury.  Patient is under palliative care at home, patient was found on the floor on Sunday morning around 130 had bruise all over her right eye.  Over the next 24 to 48 hours patient developed worsening confusion with difficulty formulating sentences, stool and urine incontinence and was having headache brought to the ED In ED-CBC showed no leukocytosis or anemia hemoglobin 12.1.Chemistry revealed normal renal function, creatinine 1.1,with mild hypokalemia 3.4.CT head showed acute subdural hematoma on the right and left measuring about 5 mm inthickness. CT also revealed fractures of the maxillary wall,right lateral and anterior with some mild displacement,and paranasal sinus opacification. Patient was seen by neurosurgery recommended 24 observation with repeat CT head in the morning. Patient was admitted Seen by neurosurgery repeat CT head stable no further need for CT head or any intervention as per CT surgery Seen by PT OT recommending skilled nursing facility At this time she is alert awake oriented x1 at baseline.  Plan is to discharge  him skilled nursing facility.  If she has further decline in the future she may be eligible for hospice and niece is aware about the plan.  Discharge Diagnoses:  Status post fall at home Acute on chronic bilateral subdural hematoma Seen by neurosurgery repeat CT head stable no further need of CT head as per Dr. Gracelyn Nurse communicatedwith him.  Continue PT OT and rehabilitation. snf today, covid came back negative.  Low-grade temp 2/22 pm: no recurrence of fever.UA repeated unremarkable.    Advanced Dementia: Alert awake oriented x1.  Has had recent decline.  She is being discharged to skilled nursing facility.  Essential Hypertension:BP controlled on amlodipine.    Depression:continue Celexa.  CT also revealed fractures of the maxillary wall,right lateral and anterior with some mild displacement,and paranasal sinus opacification- ENT was consulted by ED and no acute intervention needed.  Outpatient follow-up.    Goal of care she is DNR.  Palliative care consulted for ongoing discussion and further disposition At this time plan is for skilled nursing facility disposition,awaiting for bed. If patient declines niece is open to hospice in future Consults:  Pmt  Subjective: Alert awake oriented x1 not in acute distress.  No acute event overnight.  Discharge Exam: Vitals:   03/23/20 0700 03/23/20 0921  BP: 99/81 116/70  Pulse: 62   Resp: 17   Temp: (!) 97.5 F (36.4 C)   SpO2: 100%    General: Pt is alert, awake, not in acute distress Cardiovascular: RRR, S1/S2 +, no rubs, no gallops Respiratory: CTA bilaterally, no wheezing, no rhonchi  Abdominal: Soft, NT, ND, bowel sounds + Extremities: no edema, no cyanosis  Discharge Instructions  Discharge Instructions    Diet - low sodium heart healthy   Complete by: As directed    Discharge instructions   Complete by: As directed    Please call call MD or return to ER for similar or worsening recurring problem that brought you  to hospital or if any fever,nausea/vomiting,abdominal pain, uncontrolled pain, chest pain,  shortness of breath or any other alarming symptoms.  Please follow-up your doctor as instructed in a week time and call the office for appointment.  Please avoid alcohol, smoking, or any other illicit substance and maintain healthy habits including taking your regular medications as prescribed.  You were cared for by a hospitalist during your hospital stay. If you have any questions about your discharge medications or the care you received while you were in the hospital after you are discharged, you can call the unit and ask to speak with the hospitalist on call if the hospitalist that took care of you is not available.  Once you are discharged, your primary care physician will handle any further medical issues. Please note that NO REFILLS for any discharge medications will be authorized once you are discharged, as it is imperative that you return to your primary care physician (or establish a relationship with a primary care physician if you do not have one) for your aftercare needs so that they can reassess your need for medications and monitor your lab values   Increase activity slowly   Complete by: As directed      Allergies as of 03/23/2020   No Known Allergies     Medication List    STOP taking these medications   ibuprofen 200 MG tablet Commonly known as: ADVIL     TAKE these medications   amLODipine 5 MG tablet Commonly known as: NORVASC TAKE 1 TABLET EVERY DAY FOR BLOOD PRESSURE What changed:   how much to take  how to take this  when to take this  additional instructions   citalopram 20 MG tablet Commonly known as: CELEXA TAKE 1 TABLET EVERY DAY FOR ANXIETY What changed:   how much to take  how to take this  when to take this  additional instructions   polyethylene glycol 17 g packet Commonly known as: MIRALAX / GLYCOLAX Take 17 g by mouth daily as needed for mild  constipation.       Follow-up Information    Doreene Nest, NP Follow up in 1 week(s).   Specialty: Internal Medicine Contact information: [address] [place] [address] [phone]              No Known Allergies  The results of significant diagnostics from this hospitalization (including imaging, microbiology, ancillary and laboratory) are listed below for reference.    Microbiology: Recent Results (from the past 240 hour(s))  Resp Panel by RT-PCR (Flu A&B, Covid) Nasopharyngeal Swab     Status: None   Collection Time: 03/20/20  5:22 PM   Specimen: Nasopharyngeal Swab; Nasopharyngeal(NP) swabs in vial transport medium  Result Value Ref Range Status   SARS Coronavirus 2 by RT PCR NEGATIVE NEGATIVE Final    Comment: (NOTE) SARS-CoV-2 target nucleic acids are NOT DETECTED.  The SARS-CoV-2 RNA is generally detectable in upper respiratory specimens during the acute phase of infection. The lowest concentration of SARS-CoV-2 viral copies this assay can detect is 138 copies/mL. A negative result does not  preclude SARS-Cov-2 infection and should not be used as the sole basis for treatment or other patient management decisions. A negative result may occur with  improper specimen collection/handling, submission of specimen other than nasopharyngeal swab, presence of viral mutation(s) within the areas targeted by this assay, and inadequate number of viral copies(<138 copies/mL). A negative result must be combined with clinical observations, patient history, and epidemiological information. The expected result is Negative.  Fact Sheet for Patients:  BloggerCourse.com  Fact Sheet for Healthcare Providers:  SeriousBroker.it  This test is no t yet approved or cleared by the [place] FDA and  has been authorized for detection and/or diagnosis of SARS-CoV-2 by FDA under an Emergency Use Authorization (EUA). This  EUA will remain  in effect (meaning this test can be used) for the duration of the COVID-19 declaration under Section 564(b)(1) of the Act, 21 U.S.C.section 360bbb-3(b)(1), unless the authorization is terminated  or revoked sooner.       Influenza A by PCR NEGATIVE NEGATIVE Final   Influenza B by PCR NEGATIVE NEGATIVE Final    Comment: (NOTE) The Xpert Xpress SARS-CoV-2/FLU/RSV plus assay is intended as an aid in the diagnosis of influenza from Nasopharyngeal swab specimens and should not be used as a sole basis for treatment. Nasal washings and aspirates are unacceptable for Xpert Xpress SARS-CoV-2/FLU/RSV testing.  Fact Sheet for Patients: BloggerCourse.com  Fact Sheet for Healthcare Providers: SeriousBroker.it  This test is not yet approved or cleared by the [place] FDA and has been authorized for detection and/or diagnosis of SARS-CoV-2 by FDA under an Emergency Use Authorization (EUA). This EUA will remain in effect (meaning this test can be used) for the duration of the COVID-19 declaration under Section 564(b)(1) of the Act, 21 U.S.C. section 360bbb-3(b)(1), unless the authorization is terminated or revoked.  Performed at [facility], [address], [place], [place] [address]   MRSA PCR Screening     Status: None   Collection Time: 03/21/20  4:34 AM   Specimen: Nasopharyngeal  Result Value Ref Range Status   MRSA by PCR NEGATIVE NEGATIVE Final    Comment:        The GeneXpert MRSA Assay (FDA approved for NASAL specimens only), is one component of a comprehensive MRSA colonization surveillance program. It is not intended to diagnose MRSA infection nor to guide or monitor treatment for MRSA infections. Performed at [facility], [address] [place], [place] [address]   SARS CORONAVIRUS 2 (TAT 6-24 HRS) Nasopharyngeal Nasopharyngeal Swab     Status: None   Collection Time: 03/22/20  2:38  PM   Specimen: Nasopharyngeal Swab  Result Value Ref Range Status   SARS Coronavirus 2 NEGATIVE NEGATIVE Final    Comment: (NOTE) SARS-CoV-2 target nucleic acids are NOT DETECTED.  The SARS-CoV-2 RNA is generally detectable in upper and lower respiratory specimens during the acute phase of infection. Negative results do not preclude SARS-CoV-2 infection, do not rule out co-infections with other pathogens, and should not be used as the sole basis for treatment or other patient management decisions. Negative results must be combined with clinical observations, patient history, and epidemiological information. The expected result is Negative.  Fact Sheet for Patients: HairSlick.no  Fact Sheet for Healthcare Providers: quierodirigir.com  This test is not yet approved or cleared by the [place] FDA and  has been authorized for detection and/or diagnosis of SARS-CoV-2 by FDA under an Emergency Use Authorization (EUA). This EUA will remain  in  effect (meaning this test can be used) for the duration of the COVID-19 declaration under Se ction 564(b)(1) of the Act, 21 U.S.C. section 360bbb-3(b)(1), unless the authorization is terminated or revoked sooner.  Performed at [facility], [address] [place], [place] [address]     Procedures/Studies: CT HEAD WO CONTRAST  Result Date: 03/20/2020 CLINICAL DATA:  Fall.  Intracranial hemorrhage follow-up EXAM: CT HEAD WITHOUT CONTRAST CT CERVICAL SPINE WITHOUT CONTRAST TECHNIQUE: Multidetector CT imaging of the head and cervical spine was performed following the standard protocol without intravenous contrast. Multiplanar CT image reconstructions of the cervical spine were also generated. COMPARISON:  CT head 03/20/2020 approximately 2 hours prior to the current study. FINDINGS: CT HEAD FINDINGS Brain: Mixed density subdural hematomas bilaterally approximately 5 mm in thickness.  These are predominately low signal but there are scattered areas of high density blood present as well. Small subarachnoid hemorrhage left frontal lobe appears unchanged. No new hemorrhage Generalized atrophy. No midline shift. No acute infarct. Mild chronic ischemic change in the white matter. Vascular: Negative for hyperdense vessel Skull: Negative Sinuses/Orbits: Near complete opacification right maxillary sinus with air-fluid level. Near complete opacification left sphenoid sinus. Bilateral cataract extraction Other: None CT CERVICAL SPINE FINDINGS Alignment: 2 mm anterolisthesis C3-4. 3 mm anterolisthesis C4-5. 2 mm anterolisthesis C5-6. Skull base and vertebrae: Negative for fracture Soft tissues and spinal canal: Negative Disc levels: Multilevel disc and facet degeneration. Negative for spinal stenosis. Upper chest: Lung apices clear bilaterally. Other: None IMPRESSION: Mixed density subdural hematoma bilaterally, unchanged from earlier today. Small volume subarachnoid hemorrhage left frontal lobe unchanged. No new hemorrhage since the study earlier today. Negative for cervical spine fracture. Electronically Signed   By: Marlan Palau M.D.   On: 03/20/2020 18:55   CT HEAD WO CONTRAST  Result Date: 03/20/2020 CLINICAL DATA:  Fall EXAM: CT HEAD WITHOUT CONTRAST TECHNIQUE: Contiguous axial images were obtained from the base of the skull through the vertex without intravenous contrast. COMPARISON:  November 25, 2015 FINDINGS: Brain: Mild diffuse atrophy is noted. There is an acute subdural hematoma on the right extending along the anterior, temporal, parietal, and occipital regions. The maximum thickness of this right-sided subdural hematoma is measured at 5 mm. A somewhat similar acute appearing hematoma is seen on the left extending over frontal, parietal, temporal, and occipital regions with a maximum thickness of 5 mm. No appreciable midline shift evident. There is focal intra-axial hemorrhage in the left  frontal region peripherally, likely due to parenchymal contusions. No other intra-axial hemorrhage is evident. There is patchy periventricular small vessel disease. No acute appearing infarct evident. Vascular: No hyperdense vessel. There is calcification in each carotid siphon region. Skull: Bony calvarium appears intact. Sinuses/Orbits: There is opacification of the left sphenoid sinus. There is opacification of much of the right frontal sinus. There is opacification in a posterior ethmoid air cell on the left. There is a displaced fracture of the lateral right maxillary wall as well as posterior displacement of a fracture of the right maxillary antrum anteriorly. Orbits appear symmetric bilaterally. Other: Mastoid air cells are clear. IMPRESSION: 1. Acute subdural hematoma on each side with each acute subdural hematoma measuring 5 mm in thickness. These hematomas extend across frontal, temporal, parietal, and occipital regions bilaterally. No midline shift. 2. Intra-axial hemorrhage in the periphery of the posterior left frontal lobe somewhat superiorly without appreciable associated mass effect. Suspect parenchymal contusion in this area. 3.  Underlying atrophy with periventricular small vessel disease. 4. Fractures  of the right lateral and anterior maxillary walls. Multiple foci of paranasal sinus opacifications. 5.  Foci of arterial vascular calcification noted. Critical Value/emergent results were called by telephone at the time of interpretation on 03/20/2020 at 4:34 pm to provider Marisa Hua, PA , who verbally acknowledged these results. Electronically Signed   By: Bretta Bang III M.D.   On: 03/20/2020 16:34   CT Cervical Spine Wo Contrast  Result Date: 03/20/2020 CLINICAL DATA:  Fall.  Intracranial hemorrhage follow-up EXAM: CT HEAD WITHOUT CONTRAST CT CERVICAL SPINE WITHOUT CONTRAST TECHNIQUE: Multidetector CT imaging of the head and cervical spine was performed following the standard protocol  without intravenous contrast. Multiplanar CT image reconstructions of the cervical spine were also generated. COMPARISON:  CT head 03/20/2020 approximately 2 hours prior to the current study. FINDINGS: CT HEAD FINDINGS Brain: Mixed density subdural hematomas bilaterally approximately 5 mm in thickness. These are predominately low signal but there are scattered areas of high density blood present as well. Small subarachnoid hemorrhage left frontal lobe appears unchanged. No new hemorrhage Generalized atrophy. No midline shift. No acute infarct. Mild chronic ischemic change in the white matter. Vascular: Negative for hyperdense vessel Skull: Negative Sinuses/Orbits: Near complete opacification right maxillary sinus with air-fluid level. Near complete opacification left sphenoid sinus. Bilateral cataract extraction Other: None CT CERVICAL SPINE FINDINGS Alignment: 2 mm anterolisthesis C3-4. 3 mm anterolisthesis C4-5. 2 mm anterolisthesis C5-6. Skull base and vertebrae: Negative for fracture Soft tissues and spinal canal: Negative Disc levels: Multilevel disc and facet degeneration. Negative for spinal stenosis. Upper chest: Lung apices clear bilaterally. Other: None IMPRESSION: Mixed density subdural hematoma bilaterally, unchanged from earlier today. Small volume subarachnoid hemorrhage left frontal lobe unchanged. No new hemorrhage since the study earlier today. Negative for cervical spine fracture. Electronically Signed   By: Marlan Palau M.D.   On: 03/20/2020 18:55    Labs: BNP (last 3 results) No results for input(s): BNP in the last 8760 hours. Basic Metabolic Panel: Recent Labs  Lab 03/20/20 1536 03/21/20 0331  NA 141 142  K 3.4* 3.7  CL 104 104  CO2 27 25  GLUCOSE 118* 113*  BUN 15 11  CREATININE 1.10* 0.90  CALCIUM 9.3 9.4   Liver Function Tests: Recent Labs  Lab 03/20/20 1536  AST 18  ALT 14  ALKPHOS 96  BILITOT 0.3  PROT 6.9  ALBUMIN 4.1   No results for input(s): LIPASE,  AMYLASE in the last 168 hours. No results for input(s): AMMONIA in the last 168 hours. CBC: Recent Labs  Lab 03/20/20 1536 03/21/20 0331  WBC 6.4 6.3  NEUTROABS 4.8  --   HGB 12.1 12.6  HCT 35.2* 37.9  MCV 96.7 95.9  PLT 242 239   Cardiac Enzymes: No results for input(s): CKTOTAL, CKMB, CKMBINDEX, TROPONINI in the last 168 hours. BNP: Invalid input(s): POCBNP CBG: No results for input(s): GLUCAP in the last 168 hours. D-Dimer No results for input(s): DDIMER in the last 72 hours. Hgb A1c No results for input(s): HGBA1C in the last 72 hours. Lipid Profile No results for input(s): CHOL, HDL, LDLCALC, TRIG, CHOLHDL, LDLDIRECT in the last 72 hours. Thyroid function studies No results for input(s): TSH, T4TOTAL, T3FREE, THYROIDAB in the last 72 hours.  Invalid input(s): FREET3 Anemia work up No results for input(s): VITAMINB12, FOLATE, FERRITIN, TIBC, IRON, RETICCTPCT in the last 72 hours. Urinalysis    Component Value Date/Time   COLORURINE YELLOW 03/22/2020 0740   APPEARANCEUR CLEAR 03/22/2020 0740   LABSPEC  1.012 03/22/2020 0740   PHURINE 8.0 03/22/2020 0740   GLUCOSEU NEGATIVE 03/22/2020 0740   HGBUR NEGATIVE 03/22/2020 0740   BILIRUBINUR NEGATIVE 03/22/2020 0740   KETONESUR 5 (A) 03/22/2020 0740   PROTEINUR NEGATIVE 03/22/2020 0740   UROBILINOGEN 0.2 06/04/2014 1345   NITRITE NEGATIVE 03/22/2020 0740   LEUKOCYTESUR NEGATIVE 03/22/2020 0740   Sepsis Labs Invalid input(s): PROCALCITONIN,  WBC,  LACTICIDVEN Microbiology Recent Results (from the past 240 hour(s))  Resp Panel by RT-PCR (Flu A&B, Covid) Nasopharyngeal Swab     Status: None   Collection Time: 03/20/20  5:22 PM   Specimen: Nasopharyngeal Swab; Nasopharyngeal(NP) swabs in vial transport medium  Result Value Ref Range Status   SARS Coronavirus 2 by RT PCR NEGATIVE NEGATIVE Final    Comment: (NOTE) SARS-CoV-2 target nucleic acids are NOT DETECTED.  The SARS-CoV-2 RNA is generally detectable in upper  respiratory specimens during the acute phase of infection. The lowest concentration of SARS-CoV-2 viral copies this assay can detect is 138 copies/mL. A negative result does not preclude SARS-Cov-2 infection and should not be used as the sole basis for treatment or other patient management decisions. A negative result may occur with  improper specimen collection/handling, submission of specimen other than nasopharyngeal swab, presence of viral mutation(s) within the areas targeted by this assay, and inadequate number of viral copies(<138 copies/mL). A negative result must be combined with clinical observations, patient history, and epidemiological information. The expected result is Negative.  Fact Sheet for Patients:  BloggerCourse.comhttps://www.fda.gov/media/152166/download  Fact Sheet for Healthcare Providers:  SeriousBroker.ithttps://www.fda.gov/media/152162/download  This test is no t yet approved or cleared by the [place] FDA and  has been authorized for detection and/or diagnosis of SARS-CoV-2 by FDA under an Emergency Use Authorization (EUA). This EUA will remain  in effect (meaning this test can be used) for the duration of the COVID-19 declaration under Section 564(b)(1) of the Act, 21 U.S.C.section 360bbb-3(b)(1), unless the authorization is terminated  or revoked sooner.       Influenza A by PCR NEGATIVE NEGATIVE Final   Influenza B by PCR NEGATIVE NEGATIVE Final    Comment: (NOTE) The Xpert Xpress SARS-CoV-2/FLU/RSV plus assay is intended as an aid in the diagnosis of influenza from Nasopharyngeal swab specimens and should not be used as a sole basis for treatment. Nasal washings and aspirates are unacceptable for Xpert Xpress SARS-CoV-2/FLU/RSV testing.  Fact Sheet for Patients: BloggerCourse.comhttps://www.fda.gov/media/152166/download  Fact Sheet for Healthcare Providers: SeriousBroker.ithttps://www.fda.gov/media/152162/download  This test is not yet approved or cleared by the [place] FDA and has been  authorized for detection and/or diagnosis of SARS-CoV-2 by FDA under an Emergency Use Authorization (EUA). This EUA will remain in effect (meaning this test can be used) for the duration of the COVID-19 declaration under Section 564(b)(1) of the Act, 21 U.S.C. section 360bbb-3(b)(1), unless the authorization is terminated or revoked.  Performed at [facility], [address] [place], [place] [address]   MRSA PCR Screening     Status: None   Collection Time: 03/21/20  4:34 AM   Specimen: Nasopharyngeal  Result Value Ref Range Status   MRSA by PCR NEGATIVE NEGATIVE Final    Comment:        The GeneXpert MRSA Assay (FDA approved for NASAL specimens only), is one component of a comprehensive MRSA colonization surveillance program. It is not intended to diagnose MRSA infection nor to guide or monitor treatment for MRSA infections. Performed at [facility], [address] [place], [place]  [address]   SARS CORONAVIRUS 2 (TAT 6-24 HRS) Nasopharyngeal Nasopharyngeal Swab     Status: None   Collection Time: 03/22/20  2:38 PM   Specimen: Nasopharyngeal Swab  Result Value Ref Range Status   SARS Coronavirus 2 NEGATIVE NEGATIVE Final    Comment: (NOTE) SARS-CoV-2 target nucleic acids are NOT DETECTED.  The SARS-CoV-2 RNA is generally detectable in upper and lower respiratory specimens during the acute phase of infection. Negative results do not preclude SARS-CoV-2 infection, do not rule out co-infections with other pathogens, and should not be used as the sole basis for treatment or other patient management decisions. Negative results must be combined with clinical observations, patient history, and epidemiological information. The expected result is Negative.  Fact Sheet for Patients: HairSlick.no  Fact Sheet for Healthcare Providers: quierodirigir.com  This test is not yet approved or cleared by the [place] FDA and  has been authorized for detection and/or diagnosis of SARS-CoV-2 by FDA under an Emergency Use Authorization (EUA). This EUA will remain  in effect (meaning this test can be used) for the duration of the COVID-19 declaration under Se ction 564(b)(1) of the Act, 21 U.S.C. section 360bbb-3(b)(1), unless the authorization is terminated or revoked sooner.  Performed at [facility], [address] [place], [place] [address]      Time coordinating discharge: 25 minutes  SIGNED: Lanae Boast, MD  [facility] 03/23/2020, 10:41 AM  If 7PM-7AM, please contact night-coverage [email or web address]

## 2020-03-23 NOTE — Plan of Care (Signed)

## 2020-03-23 NOTE — Plan of Care (Signed)
  Problem: Education: Goal: Knowledge of General Education information will improve Description: Including pain rating scale, medication(s)/side effects and non-pharmacologic comfort measures 03/23/2020 1144 by Thom Chimes, RN Outcome: Adequate for Discharge 03/23/2020 1143 by Thom Chimes, RN Outcome: Adequate for Discharge   Problem: Health Behavior/Discharge Planning: Goal: Ability to manage health-related needs will improve 03/23/2020 1144 by Thom Chimes, RN Outcome: Adequate for Discharge 03/23/2020 1143 by Thom Chimes, RN Outcome: Adequate for Discharge   Problem: Clinical Measurements: Goal: Ability to maintain clinical measurements within normal limits will improve 03/23/2020 1144 by Thom Chimes, RN Outcome: Adequate for Discharge 03/23/2020 1143 by Thom Chimes, RN Outcome: Adequate for Discharge Goal: Will remain free from infection 03/23/2020 1144 by Thom Chimes, RN Outcome: Adequate for Discharge 03/23/2020 1143 by Thom Chimes, RN Outcome: Adequate for Discharge Goal: Diagnostic test results will improve 03/23/2020 1144 by Thom Chimes, RN Outcome: Adequate for Discharge 03/23/2020 1143 by Thom Chimes, RN Outcome: Adequate for Discharge Goal: Respiratory complications will improve 03/23/2020 1144 by Thom Chimes, RN Outcome: Adequate for Discharge 03/23/2020 1143 by Thom Chimes, RN Outcome: Adequate for Discharge Goal: Cardiovascular complication will be avoided 03/23/2020 1144 by Thom Chimes, RN Outcome: Adequate for Discharge 03/23/2020 1143 by Thom Chimes, RN Outcome: Adequate for Discharge   Problem: Activity: Goal: Risk for activity intolerance will decrease 03/23/2020 1144 by Thom Chimes, RN Outcome: Adequate for Discharge 03/23/2020 1143 by Thom Chimes, RN Outcome: Adequate for Discharge   Problem: Nutrition: Goal: Adequate nutrition will be maintained 03/23/2020 1144 by Thom Chimes,  RN Outcome: Adequate for Discharge 03/23/2020 1143 by Thom Chimes, RN Outcome: Adequate for Discharge   Problem: Coping: Goal: Level of anxiety will decrease 03/23/2020 1144 by Thom Chimes, RN Outcome: Adequate for Discharge 03/23/2020 1143 by Thom Chimes, RN Outcome: Adequate for Discharge   Problem: Elimination: Goal: Will not experience complications related to bowel motility 03/23/2020 1144 by Thom Chimes, RN Outcome: Adequate for Discharge 03/23/2020 1143 by Thom Chimes, RN Outcome: Adequate for Discharge Goal: Will not experience complications related to urinary retention 03/23/2020 1144 by Thom Chimes, RN Outcome: Adequate for Discharge 03/23/2020 1143 by Thom Chimes, RN Outcome: Adequate for Discharge   Problem: Pain Managment: Goal: General experience of comfort will improve 03/23/2020 1144 by Thom Chimes, RN Outcome: Adequate for Discharge 03/23/2020 1143 by Thom Chimes, RN Outcome: Adequate for Discharge   Problem: Safety: Goal: Ability to remain free from injury will improve 03/23/2020 1144 by Thom Chimes, RN Outcome: Adequate for Discharge 03/23/2020 1143 by Thom Chimes, RN Outcome: Adequate for Discharge   Problem: Skin Integrity: Goal: Risk for impaired skin integrity will decrease 03/23/2020 1144 by Thom Chimes, RN Outcome: Adequate for Discharge 03/23/2020 1143 by Thom Chimes, RN Outcome: Adequate for Discharge   Problem: Acute Rehab PT Goals(only PT should resolve) Goal: Pt Will Go Supine/Side To Sit Outcome: Adequate for Discharge Goal: Pt Will Go Sit To Supine/Side Outcome: Adequate for Discharge Goal: Pt Will Transfer Bed To Chair/Chair To Bed Outcome: Adequate for Discharge

## 2020-03-23 NOTE — Progress Notes (Signed)
Discharged to facility with [facility] and 2 transporters.  No complaints.  No distress.  Belongings (including clothes and shoes) went with her.

## 2020-03-23 NOTE — TOC Transition Note (Signed)
Transition of Care [facility]) - CM/SW Discharge Note   Patient Details  Name: Novie Maggio MRN: [id number] Date of Birth: 1924/12/18  Transition of Care [facility]) CM/SW Contact:  Kermit Balo, RN Phone Number: 03/23/2020, 11:17 AM   Clinical Narrative:    Pt discharging to [facility] today. [facility] called and arranged for 12 pm pickup. Pts niece notified and will meet her at the facility. D/c packet at the desk and bedside RN updated.  Room: 605 Number for report: [phone]   Final next level of care: Skilled Nursing Facility Barriers to Discharge: No Barriers Identified   Patient Goals and CMS Choice   CMS Medicare.gov Compare Post Acute Care list provided to:: Patient Represenative (must comment) Choice offered to / list presented to :  (niece)  Discharge Placement              Patient chooses bed at: [facility] Patient to be transferred to facility by: [facility] Name of family member notified: Mrs Myles Gip Patient and family notified of of transfer: 03/23/20  Discharge Plan and Services In-house Referral: Clinical Social Work Discharge Planning Services: [employer] Consult Post Acute Care Choice: Skilled Nursing Facility                               Social Determinants of Health (SDOH) Interventions     Readmission Risk Interventions No flowsheet data found.

## 2020-03-24 ENCOUNTER — Non-Acute Institutional Stay: Payer: Medicare HMO | Admitting: Adult Health Nurse Practitioner

## 2020-03-24 ENCOUNTER — Other Ambulatory Visit: Payer: Self-pay

## 2020-03-24 DIAGNOSIS — S065X9A Traumatic subdural hemorrhage with loss of consciousness of unspecified duration, initial encounter: Secondary | ICD-10-CM | POA: Diagnosis not present

## 2020-03-24 DIAGNOSIS — F0390 Unspecified dementia without behavioral disturbance: Secondary | ICD-10-CM | POA: Diagnosis not present

## 2020-03-24 DIAGNOSIS — S065XAA Traumatic subdural hemorrhage with loss of consciousness status unknown, initial encounter: Secondary | ICD-10-CM

## 2020-03-24 DIAGNOSIS — S0240CA Maxillary fracture, right side, initial encounter for closed fracture: Secondary | ICD-10-CM | POA: Diagnosis not present

## 2020-03-24 DIAGNOSIS — R5381 Other malaise: Secondary | ICD-10-CM | POA: Diagnosis not present

## 2020-03-24 DIAGNOSIS — F0391 Unspecified dementia with behavioral disturbance: Secondary | ICD-10-CM

## 2020-03-24 DIAGNOSIS — Z515 Encounter for palliative care: Secondary | ICD-10-CM | POA: Diagnosis not present

## 2020-03-24 NOTE — Telephone Encounter (Signed)
Noted, please think I risk for the update and have her contact me if she needs anything for Cathy Rose.

## 2020-03-24 NOTE — Progress Notes (Signed)
Therapist, nutritional Palliative Care Consult Note Telephone: [phone]  Fax: [phone]  PATIENT NAME: Cathy Rose DOB: [birth year] MRN: [id number]  PRIMARY CARE PROVIDER:  Dr. Charlott Rakes  REFERRING PROVIDER:  Randell Loop PA  RESPONSIBLE PARTY: [name], niece [phone]  Chief complaint:  Follow up palliative visit/ subdural hematoma s/p fall    RECOMMENDATIONS and PLAN:  1.  Advanced care planning. Patient is DNR. Will call niece to update on today's visit.  2.  Subdural hematoma.  CTs in hospital showed that this was stable and not enlarging. No swelling or ecchymosis noted to face today but does have redness to sclera of right eye. Denies pain.  Patient having decline functionally since fall. Unable to determine if she is resistant to PT due to her not being able to or if she is afraid of falling again.  Continue supportive care at facility.  3.  Dementia. See notes below about functional status changes.  Will be better able to determine PPS once staff start working with her more and can determine her new baseline post fall/subdural hematoma.  Per hospital palliative notes, niece does not want her to undergo any surgical procedure.  Given her age and frailty this is reasonable.  Palliative will continue to monitor for symptom management/decline and make recommendations as needed. Will follow up in 2-4 weeks and as needed.   I spent 35  minutes providing this consultation, including time spent with patient, provider coordination, chart review, documentation. More than 50% of the time in this consultation was spent coordinating communication.   HISTORY OF PRESENT ILLNESS:  Cathy Rose is a 87 y.o. year old female with multiple medical problems including Alzheimer's disease, chronic kidney disease, edema,GERD, gait instability, renovascular hypertension,Vitamin b12 deficiency, Vitamin D deficiency,allergic  rhinitis,neuropathy,anxiety and history of CVA. Palliative Care was asked to help address goals of care. Patient hospitalized 2/21-2/24/22 for subdural hematoma s/p fall.  She also obtained maxillary fractures.  Patient not complaining of pain at this time.  She states she is fine.  Unable to get reliable HPI/ROS due to dementia.  HPI/ROS obtained through chart review and speaking with staff.  Patient was living at home with niece prior to her fall and has transitioned to SNF for rehab post fall.  Patient just arrived at facility yesterday so staff not familiar with her yet.  Per hospital notes she has been resistant with PT.  She is having more fecal incontinence.  Has urinary incontinence.  Her weight current weight is 136.64 with BMI of 29.65.  This is stable as 6 months ago she weighed 135.  She does require assistance with ADLs except feeding.    CODE STATUS: DNR  PPS: 40-30% HOSPICE ELIGIBILITY/DIAGNOSIS: TBD  PHYSICAL EXAM: HR 69 O2 97% on RA General: NAD, frail appearing Eyes: sclera to right eye injected due to injury from fall otherwise not icterus or drainage noted ENMT: moist mucous membranes Cardiovascular: regular rate and rhythm Pulmonary:lung sounds clear; normal respiratory effort Abdomen: soft, nontender, + bowel sounds Extremities: trace edema, no joint deformities Skin: no rashes on exposed skin Neurological: Weakness; A&O to person today  PAST MEDICAL HISTORY:  Past Medical History:  Diagnosis Date  . Alzheimer's disease (HCC)   . Anxiety   . Dementia (HCC)   . Depression   . GERD (gastroesophageal reflux disease)   . Hallucinations   . Headache   . Heart beat abnormality   . Hypertension   . Kidney disease   .  Osteoarthritis   . Stroke (HCC)   . TIA (transient ischemic attack)   . Vitamin B deficiency   . Vitamin D deficiency     SOCIAL HX:  Social History   Tobacco Use  . Smoking status: Never Smoker  . Smokeless tobacco: Never Used  Substance  Use Topics  . Alcohol use: No    ALLERGIES: No Known Allergies   PERTINENT MEDICATIONS:  Outpatient Encounter Medications as of 03/24/2020  Medication Sig  . amLODipine (NORVASC) 5 MG tablet TAKE 1 TABLET EVERY DAY FOR BLOOD PRESSURE (Patient taking differently: Take 5 mg by mouth at bedtime.)  . citalopram (CELEXA) 20 MG tablet TAKE 1 TABLET EVERY DAY FOR ANXIETY (Patient taking differently: Take 20 mg by mouth daily.)  . polyethylene glycol (MIRALAX / GLYCOLAX) 17 g packet Take 17 g by mouth daily as needed for mild constipation.   No facility-administered encounter medications on file as of 03/24/2020.     Dekker Verga Marlena Clipper, NP

## 2020-03-27 ENCOUNTER — Telehealth: Payer: Self-pay | Admitting: Adult Health Nurse Practitioner

## 2020-03-27 NOTE — Telephone Encounter (Signed)
Spoke with niece to update on last week's visit.  Encouraged to call with any questions or concerns.   Erminie Foulks K. Garner Nash NP

## 2020-03-27 NOTE — Telephone Encounter (Signed)
She was informed will let our office know if any questions.

## 2020-03-28 DIAGNOSIS — M6281 Muscle weakness (generalized): Secondary | ICD-10-CM | POA: Diagnosis not present

## 2020-03-28 DIAGNOSIS — S065X9A Traumatic subdural hemorrhage with loss of consciousness of unspecified duration, initial encounter: Secondary | ICD-10-CM | POA: Diagnosis not present

## 2020-03-28 DIAGNOSIS — R5381 Other malaise: Secondary | ICD-10-CM | POA: Diagnosis not present

## 2020-03-28 DIAGNOSIS — F0390 Unspecified dementia without behavioral disturbance: Secondary | ICD-10-CM | POA: Diagnosis not present

## 2020-03-28 DIAGNOSIS — F339 Major depressive disorder, recurrent, unspecified: Secondary | ICD-10-CM | POA: Diagnosis not present

## 2020-03-28 DIAGNOSIS — I10 Essential (primary) hypertension: Secondary | ICD-10-CM | POA: Diagnosis not present

## 2020-03-28 DIAGNOSIS — F411 Generalized anxiety disorder: Secondary | ICD-10-CM | POA: Diagnosis not present

## 2020-03-28 DIAGNOSIS — K219 Gastro-esophageal reflux disease without esophagitis: Secondary | ICD-10-CM | POA: Diagnosis not present

## 2020-03-28 DIAGNOSIS — N189 Chronic kidney disease, unspecified: Secondary | ICD-10-CM | POA: Diagnosis not present

## 2020-03-28 DIAGNOSIS — M1990 Unspecified osteoarthritis, unspecified site: Secondary | ICD-10-CM | POA: Diagnosis not present

## 2020-03-28 DIAGNOSIS — F329 Major depressive disorder, single episode, unspecified: Secondary | ICD-10-CM | POA: Diagnosis not present

## 2020-03-28 DIAGNOSIS — R2681 Unsteadiness on feet: Secondary | ICD-10-CM | POA: Diagnosis not present

## 2020-03-30 DIAGNOSIS — F0390 Unspecified dementia without behavioral disturbance: Secondary | ICD-10-CM | POA: Diagnosis not present

## 2020-03-30 DIAGNOSIS — R2681 Unsteadiness on feet: Secondary | ICD-10-CM | POA: Diagnosis not present

## 2020-03-30 DIAGNOSIS — I10 Essential (primary) hypertension: Secondary | ICD-10-CM | POA: Diagnosis not present

## 2020-03-30 DIAGNOSIS — M1990 Unspecified osteoarthritis, unspecified site: Secondary | ICD-10-CM | POA: Diagnosis not present

## 2020-03-30 DIAGNOSIS — S0240CA Maxillary fracture, right side, initial encounter for closed fracture: Secondary | ICD-10-CM | POA: Diagnosis not present

## 2020-03-30 DIAGNOSIS — F329 Major depressive disorder, single episode, unspecified: Secondary | ICD-10-CM | POA: Diagnosis not present

## 2020-03-30 DIAGNOSIS — F411 Generalized anxiety disorder: Secondary | ICD-10-CM | POA: Diagnosis not present

## 2020-03-30 DIAGNOSIS — M6281 Muscle weakness (generalized): Secondary | ICD-10-CM | POA: Diagnosis not present

## 2020-03-30 DIAGNOSIS — K219 Gastro-esophageal reflux disease without esophagitis: Secondary | ICD-10-CM | POA: Diagnosis not present

## 2020-03-30 DIAGNOSIS — R5381 Other malaise: Secondary | ICD-10-CM | POA: Diagnosis not present

## 2020-03-30 DIAGNOSIS — N189 Chronic kidney disease, unspecified: Secondary | ICD-10-CM | POA: Diagnosis not present

## 2020-04-03 DIAGNOSIS — F32A Depression, unspecified: Secondary | ICD-10-CM | POA: Diagnosis not present

## 2020-04-03 DIAGNOSIS — F0390 Unspecified dementia without behavioral disturbance: Secondary | ICD-10-CM | POA: Diagnosis not present

## 2020-04-04 ENCOUNTER — Telehealth: Payer: Self-pay | Admitting: Adult Health Nurse Practitioner

## 2020-04-04 ENCOUNTER — Non-Acute Institutional Stay: Payer: Medicare HMO | Admitting: Adult Health Nurse Practitioner

## 2020-04-04 ENCOUNTER — Other Ambulatory Visit: Payer: Self-pay

## 2020-04-04 DIAGNOSIS — M1990 Unspecified osteoarthritis, unspecified site: Secondary | ICD-10-CM | POA: Diagnosis not present

## 2020-04-04 DIAGNOSIS — F0391 Unspecified dementia with behavioral disturbance: Secondary | ICD-10-CM | POA: Diagnosis not present

## 2020-04-04 DIAGNOSIS — M6281 Muscle weakness (generalized): Secondary | ICD-10-CM | POA: Diagnosis not present

## 2020-04-04 DIAGNOSIS — K219 Gastro-esophageal reflux disease without esophagitis: Secondary | ICD-10-CM | POA: Diagnosis not present

## 2020-04-04 DIAGNOSIS — S065XAA Traumatic subdural hemorrhage with loss of consciousness status unknown, initial encounter: Secondary | ICD-10-CM

## 2020-04-04 DIAGNOSIS — I10 Essential (primary) hypertension: Secondary | ICD-10-CM | POA: Diagnosis not present

## 2020-04-04 DIAGNOSIS — S065X9A Traumatic subdural hemorrhage with loss of consciousness of unspecified duration, initial encounter: Secondary | ICD-10-CM | POA: Diagnosis not present

## 2020-04-04 DIAGNOSIS — Z515 Encounter for palliative care: Secondary | ICD-10-CM | POA: Diagnosis not present

## 2020-04-04 DIAGNOSIS — F329 Major depressive disorder, single episode, unspecified: Secondary | ICD-10-CM | POA: Diagnosis not present

## 2020-04-04 DIAGNOSIS — F411 Generalized anxiety disorder: Secondary | ICD-10-CM | POA: Diagnosis not present

## 2020-04-04 DIAGNOSIS — F0390 Unspecified dementia without behavioral disturbance: Secondary | ICD-10-CM | POA: Diagnosis not present

## 2020-04-04 DIAGNOSIS — N189 Chronic kidney disease, unspecified: Secondary | ICD-10-CM | POA: Diagnosis not present

## 2020-04-04 DIAGNOSIS — R2681 Unsteadiness on feet: Secondary | ICD-10-CM | POA: Diagnosis not present

## 2020-04-04 NOTE — Telephone Encounter (Signed)
This is late entry.  Spoke with niece yesterday, 04/03/20.  Returned niece's message to office.  She is updating on her aunt's lack of eating and drinking at facility.  While at facility she was able to get her aunt to eat better.  Would like a visit.  Will visit 04/04/20. Amy K. Garner Nash NP

## 2020-04-04 NOTE — Progress Notes (Signed)
Therapist, nutritional Palliative Care Consult Note Telephone: [phone]  Fax: [phone]  PATIENT NAME: Cathy Rose DOB: 03/14/1924 MRN: [id number]  PRIMARY CARE PROVIDER:  Dr. Charlott Rakes  REFERRING PROVIDER:  Randell Loop PA  RESPONSIBLE PARTY: [name], niece [phone]  Chief complaint:  Follow up palliative visit/ subdural hematoma s/p fall   RECOMMENDATIONS and PLAN:  1.  Advanced care planning. Patient is DNR. Called niece to update on visit.  Left VM with reason for call and contact info  2.  Subdural hematoma.  Patient seems to be improving with therapy.  Though she seems to have decline in her cognition.  She is not following commands well for me today.  Will answer questions with "I am fine." So unsure if she is not comprehending or not hearing well.  Continue PT/OT as ordered.  Continue supportive care at the facility  3.  Dementia.  As noted above she is improving functionally.  She is able to ambulate with assistance.  She is incontinent of B&B.  She requires assistance with ADLs but can feed herself.  Continue supportive care at the facility.  Patient appears to be slowly returning to baseline functionally but is having some cognitive decline. Will discuss further with niece options for staying at the facility or returning home. Palliative will continue to monitor for symptom management/decline and make recommendations as needed. Will follow up in 2-4 weeks and as needed.  I spent 30 minutes providing this consultation, including time with patient, provider coordination, chart review, documentation. More than 50% of the time in this consultation was spent coordinating communication.   HISTORY OF PRESENT ILLNESS:  Cathy Rose is a 87 y.o. year old female with multiple medical problems including Alzheimer's disease, chronic kidney disease, edema,GERD, gait instability, renovascular hypertension,Vitamin b12 deficiency,  Vitamin D deficiency,allergic rhinitis,neuropathy,anxiety and history of CVA. Palliative Care was asked to help address goals of care.  Spoke with niece yesterday and had concerns that she was not eating or drinking much.  Patient has breakfast tray in front of her during visit and looks like she ate about 75% of her meal.  Reported by staff that she does eat around 25-75% of her meals.  Has gained weight since being admitted. She weighed 119 upon admission on 03/30/20 and now weighs 124.  She does seem to be doing well with therapy and reported that she ambulated with walker about 600 feet one day.  She is having more of a cognitive decline since the fall with subdural hematoma.  She is not following commands well today. HPI/ROS unreliable secondary to dementia.  Patient just states that everything is fine.  Staff with no new concerns today.     CODE STATUS: DNR  PPS: 40% HOSPICE ELIGIBILITY/DIAGNOSIS: TBD  PHYSICAL EXAM:  HR 69 O2 97% on RA General: NAD, frail appearing Eyes: sclera to right eye injected due to injury from fall otherwise not icterus or drainage noted ENMT: moist mucous membranes Cardiovascular: regular rate and rhythm Pulmonary:lung sounds clear; normal respiratory effort Abdomen: soft, nontender, + bowel sounds Extremities:traceedema, no joint deformities Skin: no rasheson exposed skin Neurological: Weakness; A&O to person today   PAST MEDICAL HISTORY:  Past Medical History:  Diagnosis Date  . Alzheimer's disease (HCC)   . Anxiety   . Dementia (HCC)   . Depression   . GERD (gastroesophageal reflux disease)   . Hallucinations   . Headache   . Heart beat abnormality   . Hypertension   .  Kidney disease   . Osteoarthritis   . Stroke (HCC)   . TIA (transient ischemic attack)   . Vitamin B deficiency   . Vitamin D deficiency     SOCIAL HX:  Social History   Tobacco Use  . Smoking status: Never Smoker  . Smokeless tobacco: Never Used  Substance Use  Topics  . Alcohol use: No    ALLERGIES: No Known Allergies   PERTINENT MEDICATIONS:  Outpatient Encounter Medications as of 04/04/2020  Medication Sig  . amLODipine (NORVASC) 5 MG tablet TAKE 1 TABLET EVERY DAY FOR BLOOD PRESSURE (Patient taking differently: Take 5 mg by mouth at bedtime.)  . citalopram (CELEXA) 20 MG tablet TAKE 1 TABLET EVERY DAY FOR ANXIETY (Patient taking differently: Take 20 mg by mouth daily.)  . polyethylene glycol (MIRALAX / GLYCOLAX) 17 g packet Take 17 g by mouth daily as needed for mild constipation.   No facility-administered encounter medications on file as of 04/04/2020.    Mckaylee Dimalanta Marlena Clipper, NP

## 2020-04-06 DIAGNOSIS — R2681 Unsteadiness on feet: Secondary | ICD-10-CM | POA: Diagnosis not present

## 2020-04-06 DIAGNOSIS — F0390 Unspecified dementia without behavioral disturbance: Secondary | ICD-10-CM | POA: Diagnosis not present

## 2020-04-06 DIAGNOSIS — F329 Major depressive disorder, single episode, unspecified: Secondary | ICD-10-CM | POA: Diagnosis not present

## 2020-04-06 DIAGNOSIS — F411 Generalized anxiety disorder: Secondary | ICD-10-CM | POA: Diagnosis not present

## 2020-04-06 DIAGNOSIS — M1990 Unspecified osteoarthritis, unspecified site: Secondary | ICD-10-CM | POA: Diagnosis not present

## 2020-04-06 DIAGNOSIS — N189 Chronic kidney disease, unspecified: Secondary | ICD-10-CM | POA: Diagnosis not present

## 2020-04-06 DIAGNOSIS — I10 Essential (primary) hypertension: Secondary | ICD-10-CM | POA: Diagnosis not present

## 2020-04-06 DIAGNOSIS — K219 Gastro-esophageal reflux disease without esophagitis: Secondary | ICD-10-CM | POA: Diagnosis not present

## 2020-04-06 DIAGNOSIS — M6281 Muscle weakness (generalized): Secondary | ICD-10-CM | POA: Diagnosis not present

## 2020-04-07 ENCOUNTER — Telehealth: Payer: Self-pay

## 2020-04-07 NOTE — Telephone Encounter (Signed)
04/07/20 @1PM : Palliative care SW outreached patients daughter, , to discuss patients DC plans from SNF.   Daughter shared that she had a care plan meeting today with facility IDT team and she has decided to leave patient at facility for long term care based on the update she received for the team. Patient is currently completley incontinent and may require 24/7 care in the home and daughter is unable to provide this level of support/care for patient. SW discussed next steps with SNF in regard to Medicaid and what the process may look like. SW also advised daughter that palliative care NP will continue to follow patient at SNF.   SW provided support and reflective listening to daughter during call. Daughter was appreciative of call and had no other questions/concnerns for SW at this time.

## 2020-04-09 DIAGNOSIS — Z4789 Encounter for other orthopedic aftercare: Secondary | ICD-10-CM | POA: Diagnosis not present

## 2020-04-09 DIAGNOSIS — R2681 Unsteadiness on feet: Secondary | ICD-10-CM | POA: Diagnosis not present

## 2020-04-09 DIAGNOSIS — I10 Essential (primary) hypertension: Secondary | ICD-10-CM | POA: Diagnosis not present

## 2020-04-09 DIAGNOSIS — F0390 Unspecified dementia without behavioral disturbance: Secondary | ICD-10-CM | POA: Diagnosis not present

## 2020-04-09 DIAGNOSIS — S065X0D Traumatic subdural hemorrhage without loss of consciousness, subsequent encounter: Secondary | ICD-10-CM | POA: Diagnosis not present

## 2020-04-09 DIAGNOSIS — R1312 Dysphagia, oropharyngeal phase: Secondary | ICD-10-CM | POA: Diagnosis not present

## 2020-04-09 DIAGNOSIS — R488 Other symbolic dysfunctions: Secondary | ICD-10-CM | POA: Diagnosis not present

## 2020-04-09 DIAGNOSIS — M6281 Muscle weakness (generalized): Secondary | ICD-10-CM | POA: Diagnosis not present

## 2020-04-10 DIAGNOSIS — R488 Other symbolic dysfunctions: Secondary | ICD-10-CM | POA: Diagnosis not present

## 2020-04-10 DIAGNOSIS — F0390 Unspecified dementia without behavioral disturbance: Secondary | ICD-10-CM | POA: Diagnosis not present

## 2020-04-10 DIAGNOSIS — R2681 Unsteadiness on feet: Secondary | ICD-10-CM | POA: Diagnosis not present

## 2020-04-10 DIAGNOSIS — Z4789 Encounter for other orthopedic aftercare: Secondary | ICD-10-CM | POA: Diagnosis not present

## 2020-04-10 DIAGNOSIS — R1312 Dysphagia, oropharyngeal phase: Secondary | ICD-10-CM | POA: Diagnosis not present

## 2020-04-10 DIAGNOSIS — S065X0D Traumatic subdural hemorrhage without loss of consciousness, subsequent encounter: Secondary | ICD-10-CM | POA: Diagnosis not present

## 2020-04-10 DIAGNOSIS — I10 Essential (primary) hypertension: Secondary | ICD-10-CM | POA: Diagnosis not present

## 2020-04-10 DIAGNOSIS — M6281 Muscle weakness (generalized): Secondary | ICD-10-CM | POA: Diagnosis not present

## 2020-04-11 ENCOUNTER — Telehealth: Payer: Self-pay

## 2020-04-11 DIAGNOSIS — Z4789 Encounter for other orthopedic aftercare: Secondary | ICD-10-CM | POA: Diagnosis not present

## 2020-04-11 DIAGNOSIS — F0390 Unspecified dementia without behavioral disturbance: Secondary | ICD-10-CM | POA: Diagnosis not present

## 2020-04-11 DIAGNOSIS — R1312 Dysphagia, oropharyngeal phase: Secondary | ICD-10-CM | POA: Diagnosis not present

## 2020-04-11 DIAGNOSIS — S065X0D Traumatic subdural hemorrhage without loss of consciousness, subsequent encounter: Secondary | ICD-10-CM | POA: Diagnosis not present

## 2020-04-11 DIAGNOSIS — R2681 Unsteadiness on feet: Secondary | ICD-10-CM | POA: Diagnosis not present

## 2020-04-11 DIAGNOSIS — M1990 Unspecified osteoarthritis, unspecified site: Secondary | ICD-10-CM | POA: Diagnosis not present

## 2020-04-11 DIAGNOSIS — K219 Gastro-esophageal reflux disease without esophagitis: Secondary | ICD-10-CM | POA: Diagnosis not present

## 2020-04-11 DIAGNOSIS — R488 Other symbolic dysfunctions: Secondary | ICD-10-CM | POA: Diagnosis not present

## 2020-04-11 DIAGNOSIS — I10 Essential (primary) hypertension: Secondary | ICD-10-CM | POA: Diagnosis not present

## 2020-04-11 DIAGNOSIS — F329 Major depressive disorder, single episode, unspecified: Secondary | ICD-10-CM | POA: Diagnosis not present

## 2020-04-11 DIAGNOSIS — N189 Chronic kidney disease, unspecified: Secondary | ICD-10-CM | POA: Diagnosis not present

## 2020-04-11 DIAGNOSIS — F411 Generalized anxiety disorder: Secondary | ICD-10-CM | POA: Diagnosis not present

## 2020-04-11 DIAGNOSIS — M6281 Muscle weakness (generalized): Secondary | ICD-10-CM | POA: Diagnosis not present

## 2020-04-11 NOTE — Telephone Encounter (Signed)
04/11/20 @6PM : Palliative care SW returned phone call to patents daughter, [name].  Daughter inquired about local skilled nursing facilities with memory care units. SW advised daughter that there are not many, but recommended she outreach [facility] in [place] and [facility] in [place]. SW and daughter discussed at length the process and requirements of LTC medicaid and patient needing a chang in program done from her community medicaid to [place]. Daughter expressed frustration around not receiving support or viable feedback from SNF staff around this process and shared that they are telling her that patient requires memory care due to her trying to constantly walk unassisted.   Daughter to speak with SNF tomorrow about assistance with LTC medicaid process and searching for a SNF with a memory care unit.

## 2020-04-12 DIAGNOSIS — R2681 Unsteadiness on feet: Secondary | ICD-10-CM | POA: Diagnosis not present

## 2020-04-12 DIAGNOSIS — M6281 Muscle weakness (generalized): Secondary | ICD-10-CM | POA: Diagnosis not present

## 2020-04-12 DIAGNOSIS — F0390 Unspecified dementia without behavioral disturbance: Secondary | ICD-10-CM | POA: Diagnosis not present

## 2020-04-12 DIAGNOSIS — S065X0D Traumatic subdural hemorrhage without loss of consciousness, subsequent encounter: Secondary | ICD-10-CM | POA: Diagnosis not present

## 2020-04-12 DIAGNOSIS — I10 Essential (primary) hypertension: Secondary | ICD-10-CM | POA: Diagnosis not present

## 2020-04-12 DIAGNOSIS — Z4789 Encounter for other orthopedic aftercare: Secondary | ICD-10-CM | POA: Diagnosis not present

## 2020-04-12 DIAGNOSIS — R1312 Dysphagia, oropharyngeal phase: Secondary | ICD-10-CM | POA: Diagnosis not present

## 2020-04-12 DIAGNOSIS — R488 Other symbolic dysfunctions: Secondary | ICD-10-CM | POA: Diagnosis not present

## 2020-04-13 DIAGNOSIS — R1312 Dysphagia, oropharyngeal phase: Secondary | ICD-10-CM | POA: Diagnosis not present

## 2020-04-13 DIAGNOSIS — R2681 Unsteadiness on feet: Secondary | ICD-10-CM | POA: Diagnosis not present

## 2020-04-13 DIAGNOSIS — R488 Other symbolic dysfunctions: Secondary | ICD-10-CM | POA: Diagnosis not present

## 2020-04-13 DIAGNOSIS — Z4789 Encounter for other orthopedic aftercare: Secondary | ICD-10-CM | POA: Diagnosis not present

## 2020-04-13 DIAGNOSIS — I10 Essential (primary) hypertension: Secondary | ICD-10-CM | POA: Diagnosis not present

## 2020-04-13 DIAGNOSIS — S065X0D Traumatic subdural hemorrhage without loss of consciousness, subsequent encounter: Secondary | ICD-10-CM | POA: Diagnosis not present

## 2020-04-13 DIAGNOSIS — F0390 Unspecified dementia without behavioral disturbance: Secondary | ICD-10-CM | POA: Diagnosis not present

## 2020-04-13 DIAGNOSIS — M6281 Muscle weakness (generalized): Secondary | ICD-10-CM | POA: Diagnosis not present

## 2020-04-14 DIAGNOSIS — F0390 Unspecified dementia without behavioral disturbance: Secondary | ICD-10-CM | POA: Diagnosis not present

## 2020-04-14 DIAGNOSIS — M6281 Muscle weakness (generalized): Secondary | ICD-10-CM | POA: Diagnosis not present

## 2020-04-14 DIAGNOSIS — R2681 Unsteadiness on feet: Secondary | ICD-10-CM | POA: Diagnosis not present

## 2020-04-14 DIAGNOSIS — R1312 Dysphagia, oropharyngeal phase: Secondary | ICD-10-CM | POA: Diagnosis not present

## 2020-04-14 DIAGNOSIS — S065X0D Traumatic subdural hemorrhage without loss of consciousness, subsequent encounter: Secondary | ICD-10-CM | POA: Diagnosis not present

## 2020-04-14 DIAGNOSIS — Z4789 Encounter for other orthopedic aftercare: Secondary | ICD-10-CM | POA: Diagnosis not present

## 2020-04-14 DIAGNOSIS — I10 Essential (primary) hypertension: Secondary | ICD-10-CM | POA: Diagnosis not present

## 2020-04-14 DIAGNOSIS — R488 Other symbolic dysfunctions: Secondary | ICD-10-CM | POA: Diagnosis not present

## 2020-04-15 DIAGNOSIS — S065X0D Traumatic subdural hemorrhage without loss of consciousness, subsequent encounter: Secondary | ICD-10-CM | POA: Diagnosis not present

## 2020-04-15 DIAGNOSIS — R1312 Dysphagia, oropharyngeal phase: Secondary | ICD-10-CM | POA: Diagnosis not present

## 2020-04-15 DIAGNOSIS — F0390 Unspecified dementia without behavioral disturbance: Secondary | ICD-10-CM | POA: Diagnosis not present

## 2020-04-15 DIAGNOSIS — Z4789 Encounter for other orthopedic aftercare: Secondary | ICD-10-CM | POA: Diagnosis not present

## 2020-04-15 DIAGNOSIS — R2681 Unsteadiness on feet: Secondary | ICD-10-CM | POA: Diagnosis not present

## 2020-04-15 DIAGNOSIS — R488 Other symbolic dysfunctions: Secondary | ICD-10-CM | POA: Diagnosis not present

## 2020-04-15 DIAGNOSIS — M6281 Muscle weakness (generalized): Secondary | ICD-10-CM | POA: Diagnosis not present

## 2020-04-15 DIAGNOSIS — I10 Essential (primary) hypertension: Secondary | ICD-10-CM | POA: Diagnosis not present

## 2020-04-16 DIAGNOSIS — R488 Other symbolic dysfunctions: Secondary | ICD-10-CM | POA: Diagnosis not present

## 2020-04-16 DIAGNOSIS — S065X0D Traumatic subdural hemorrhage without loss of consciousness, subsequent encounter: Secondary | ICD-10-CM | POA: Diagnosis not present

## 2020-04-16 DIAGNOSIS — R1312 Dysphagia, oropharyngeal phase: Secondary | ICD-10-CM | POA: Diagnosis not present

## 2020-04-16 DIAGNOSIS — I10 Essential (primary) hypertension: Secondary | ICD-10-CM | POA: Diagnosis not present

## 2020-04-16 DIAGNOSIS — R2681 Unsteadiness on feet: Secondary | ICD-10-CM | POA: Diagnosis not present

## 2020-04-16 DIAGNOSIS — F0390 Unspecified dementia without behavioral disturbance: Secondary | ICD-10-CM | POA: Diagnosis not present

## 2020-04-16 DIAGNOSIS — M6281 Muscle weakness (generalized): Secondary | ICD-10-CM | POA: Diagnosis not present

## 2020-04-16 DIAGNOSIS — Z4789 Encounter for other orthopedic aftercare: Secondary | ICD-10-CM | POA: Diagnosis not present

## 2020-04-17 DIAGNOSIS — I10 Essential (primary) hypertension: Secondary | ICD-10-CM | POA: Diagnosis not present

## 2020-04-17 DIAGNOSIS — R1312 Dysphagia, oropharyngeal phase: Secondary | ICD-10-CM | POA: Diagnosis not present

## 2020-04-17 DIAGNOSIS — M6281 Muscle weakness (generalized): Secondary | ICD-10-CM | POA: Diagnosis not present

## 2020-04-17 DIAGNOSIS — R2681 Unsteadiness on feet: Secondary | ICD-10-CM | POA: Diagnosis not present

## 2020-04-17 DIAGNOSIS — F0390 Unspecified dementia without behavioral disturbance: Secondary | ICD-10-CM | POA: Diagnosis not present

## 2020-04-17 DIAGNOSIS — R488 Other symbolic dysfunctions: Secondary | ICD-10-CM | POA: Diagnosis not present

## 2020-04-17 DIAGNOSIS — Z4789 Encounter for other orthopedic aftercare: Secondary | ICD-10-CM | POA: Diagnosis not present

## 2020-04-17 DIAGNOSIS — S065X0D Traumatic subdural hemorrhage without loss of consciousness, subsequent encounter: Secondary | ICD-10-CM | POA: Diagnosis not present

## 2020-04-18 DIAGNOSIS — N189 Chronic kidney disease, unspecified: Secondary | ICD-10-CM | POA: Diagnosis not present

## 2020-04-18 DIAGNOSIS — M1990 Unspecified osteoarthritis, unspecified site: Secondary | ICD-10-CM | POA: Diagnosis not present

## 2020-04-18 DIAGNOSIS — K219 Gastro-esophageal reflux disease without esophagitis: Secondary | ICD-10-CM | POA: Diagnosis not present

## 2020-04-18 DIAGNOSIS — F0390 Unspecified dementia without behavioral disturbance: Secondary | ICD-10-CM | POA: Diagnosis not present

## 2020-04-18 DIAGNOSIS — R488 Other symbolic dysfunctions: Secondary | ICD-10-CM | POA: Diagnosis not present

## 2020-04-18 DIAGNOSIS — R1312 Dysphagia, oropharyngeal phase: Secondary | ICD-10-CM | POA: Diagnosis not present

## 2020-04-18 DIAGNOSIS — R2681 Unsteadiness on feet: Secondary | ICD-10-CM | POA: Diagnosis not present

## 2020-04-18 DIAGNOSIS — M6281 Muscle weakness (generalized): Secondary | ICD-10-CM | POA: Diagnosis not present

## 2020-04-18 DIAGNOSIS — F411 Generalized anxiety disorder: Secondary | ICD-10-CM | POA: Diagnosis not present

## 2020-04-18 DIAGNOSIS — I10 Essential (primary) hypertension: Secondary | ICD-10-CM | POA: Diagnosis not present

## 2020-04-18 DIAGNOSIS — F329 Major depressive disorder, single episode, unspecified: Secondary | ICD-10-CM | POA: Diagnosis not present

## 2020-04-18 DIAGNOSIS — Z4789 Encounter for other orthopedic aftercare: Secondary | ICD-10-CM | POA: Diagnosis not present

## 2020-04-18 DIAGNOSIS — S065X0D Traumatic subdural hemorrhage without loss of consciousness, subsequent encounter: Secondary | ICD-10-CM | POA: Diagnosis not present

## 2020-04-19 ENCOUNTER — Non-Acute Institutional Stay: Payer: Medicare HMO | Admitting: Adult Health Nurse Practitioner

## 2020-04-19 DIAGNOSIS — G309 Alzheimer's disease, unspecified: Secondary | ICD-10-CM | POA: Diagnosis not present

## 2020-04-19 DIAGNOSIS — R5383 Other fatigue: Secondary | ICD-10-CM

## 2020-04-19 DIAGNOSIS — Z515 Encounter for palliative care: Secondary | ICD-10-CM

## 2020-04-19 DIAGNOSIS — F0280 Dementia in other diseases classified elsewhere without behavioral disturbance: Secondary | ICD-10-CM | POA: Diagnosis not present

## 2020-04-19 DIAGNOSIS — R531 Weakness: Secondary | ICD-10-CM

## 2020-04-19 NOTE — Progress Notes (Signed)
[place] Consult Note Telephone: [phone]  Fax: [phone]  PATIENT NAME: Cathy Rose DOB: 1924/05/13 MRN: [id number]  PRIMARY CARE PROVIDER:  Dr. Maryella Shivers  REFERRING PROVIDER:David Herring PA  RESPONSIBLE PARTY:[name] Royce Macadamia, niece [phone]  Chief complaint: Follow up palliative visit/lethargy and lack of appetite  Patient is resident at [facility] and niece had her at her home for the day.  Met with patient and her niece at her niece's home per niece's request as patient having change in condition  RECOMMENDATIONS and PLAN:  1.  Advanced care planning.  Patient is a DNR.  Patient is having decline and discussed option for hospice versus taking to hospital for further evaluation and treatment.  Niece has endorsed previously that she did not want any aggressive treatment given her aunt's age and dementia. She wants to make sure her aunt is comfortable at the facility. She would like hospice services once her aunt's Medicaid is approved.    2.  Lethargy/left sided weakness.  Discussed with niece that her aunt may have had an event such as another CVA or possible increased bleeding from recent subdural hematoma. She does not want to take her to the hospital and would like her to be comfortable.  As above discussed hospice and niece would like to proceed with hospice services.  Have reached out to hospice physicians for hospice eligibility and have spoken with PA at facility.    I spent 60 minutes providing this consultation, including time spent with patient/family, provider coordination, chart review, documentation. More than 50% of the time in this consultation was spent coordinating communication.   HISTORY OF PRESENT ILLNESS:  Cathy Rose is a 87 y.o. year old female with multiple medical problems including Alzheimer's disease, chronic kidney disease, edema,GERD, gait instability, renovascular  hypertension,Vitamin b12 deficiency, Vitamin D deficiency,allergic rhinitis,neuropathy,anxiety and history of CVA. Palliative Care was asked to help address goals of care. Niece has called for visit today due to concerns for increased lethargy and decreased appetite over the past 2 weeks.  States that she has noticed that she does have increased generalized weakness with more to left side.  She is not talking as much and is not following commands.  Patient only responds to questions with "I am fine."  She is not following commands today.  She is responsive and easy to waken. Niece states that she has not been eating or drinking as much.  She has only eaten a little bit of watermelon and a cup of water today.  Have spoken with PA at facility via phone and he has not been informed of any changes and will check into her latest weight.  Her appetite was improving at first at facility.  On admission she was 119 pounds and had increased to 124 pounds. As I did not see her at the facility today, did not have her most recent weight.  Will reach out to facility for weight. She does look like she has lost weight from the last time I saw her 3 weeks ago. Can especially see in her face where she has lost weight.  Though too she is having more edema which may affect her weight.   CODE STATUS: DNR  PPS: 40-30% HOSPICE ELIGIBILITY/DIAGNOSIS: TBD  PHYSICAL EXAM:  BP 110/60 HR 80 O2 98% on RA General: NAD, frail appearing Eyes: sclera anicteric and noninjected with no discharge noted ENMT: moist mucous membranes Cardiovascular: regular rate and rhythm Pulmonary:lung sounds clear; normal  respiratory effort Abdomen: soft, nontender, + bowel sounds Extremities: 1+edema, no joint deformities Skin: no rasheson exposed skin Neurological: Weakness; A&O to person  PAST MEDICAL HISTORY:  Past Medical History:  Diagnosis Date  . Alzheimer's disease ([place])   . Anxiety   . Dementia ([place])   . Depression   .  GERD (gastroesophageal reflux disease)   . Hallucinations   . Headache   . Heart beat abnormality   . Hypertension   . Kidney disease   . Osteoarthritis   . Stroke ([place])   . TIA (transient ischemic attack)   . Vitamin B deficiency   . Vitamin D deficiency     SOCIAL HX:  Social History   Tobacco Use  . Smoking status: Never Smoker  . Smokeless tobacco: Never Used  Substance Use Topics  . Alcohol use: No    ALLERGIES: No Known Allergies   PERTINENT MEDICATIONS:  Outpatient Encounter Medications as of 04/19/2020  Medication Sig  . amLODipine (NORVASC) 5 MG tablet TAKE 1 TABLET EVERY DAY FOR BLOOD PRESSURE (Patient taking differently: Take 5 mg by mouth at bedtime.)  . citalopram (CELEXA) 20 MG tablet TAKE 1 TABLET EVERY DAY FOR ANXIETY (Patient taking differently: Take 20 mg by mouth daily.)  . polyethylene glycol (MIRALAX / GLYCOLAX) 17 g packet Take 17 g by mouth daily as needed for mild constipation.   No facility-administered encounter medications on file as of 04/19/2020.     Britteny Fiebelkorn Jenetta Downer, NP

## 2020-04-20 ENCOUNTER — Non-Acute Institutional Stay: Payer: Medicare HMO | Admitting: Adult Health Nurse Practitioner

## 2020-04-20 DIAGNOSIS — F0391 Unspecified dementia with behavioral disturbance: Secondary | ICD-10-CM | POA: Diagnosis not present

## 2020-04-20 DIAGNOSIS — R531 Weakness: Secondary | ICD-10-CM

## 2020-04-20 DIAGNOSIS — R1312 Dysphagia, oropharyngeal phase: Secondary | ICD-10-CM | POA: Diagnosis not present

## 2020-04-20 DIAGNOSIS — Z4789 Encounter for other orthopedic aftercare: Secondary | ICD-10-CM | POA: Diagnosis not present

## 2020-04-20 DIAGNOSIS — F0390 Unspecified dementia without behavioral disturbance: Secondary | ICD-10-CM | POA: Diagnosis not present

## 2020-04-20 DIAGNOSIS — I10 Essential (primary) hypertension: Secondary | ICD-10-CM | POA: Diagnosis not present

## 2020-04-20 DIAGNOSIS — Z515 Encounter for palliative care: Secondary | ICD-10-CM

## 2020-04-20 DIAGNOSIS — R5383 Other fatigue: Secondary | ICD-10-CM

## 2020-04-20 DIAGNOSIS — M6281 Muscle weakness (generalized): Secondary | ICD-10-CM | POA: Diagnosis not present

## 2020-04-20 DIAGNOSIS — R2681 Unsteadiness on feet: Secondary | ICD-10-CM | POA: Diagnosis not present

## 2020-04-20 DIAGNOSIS — S065X0D Traumatic subdural hemorrhage without loss of consciousness, subsequent encounter: Secondary | ICD-10-CM | POA: Diagnosis not present

## 2020-04-20 DIAGNOSIS — R488 Other symbolic dysfunctions: Secondary | ICD-10-CM | POA: Diagnosis not present

## 2020-04-20 NOTE — Progress Notes (Signed)
Therapist, nutritional Palliative Care Consult Note Telephone: [phone]  Fax: [phone]  PATIENT NAME: Cathy Rose DOB: 1924-11-22 MRN: [id number]  PRIMARY CARE PROVIDER:   Dr. Charlott Rakes  REFERRING PROVIDER:David Herring PA  RESPONSIBLE PARTY:Cathy Rose, niece [phone]  Chief complaint: Follow up palliative visit/lethargy and lack of appetite   Spoke with patient's niece today but did not meet with patient.  RECOMMENDATIONS and PLAN:  1.  Advanced care planning.  Patient is a DNR.  Based on added information from yesterday's visit, have reached out to hospice physicians and patient does meet criteria for hospice eligibility. Reached out to provider at facility with this update. He plans on seeing patient tomorrow morning.  Will proceed with hospice referral per niece's wishes if provider is in agreement.   I spent 45 minutes providing this consultation, including time spent with patient's family, provider coordination, and documentation. More than 50% of the time in this consultation was spent coordinating communication.   HISTORY OF PRESENT ILLNESS:  Cathy Rose is a 87 y.o. year old female with multiple medical problems including Alzheimer's disease, chronic kidney disease, edema,GERD, gait instability, renovascular hypertension,Vitamin b12 deficiency, Vitamin D deficiency,allergic rhinitis,neuropathy,anxiety and history of CVA. Palliative Care was asked to help address goals of care.  Prior to her fall in February she was 137 pounds with BMI of 30.55.  Weights from facility over the past month have gone up and down from 125 to 112 to 125 to 119 to 124 over the past 5 weeks (really don't know how accurate these are). She is only eating bites and sips and will not eat anything unless someone is feeding her.  States her clothes size has gone from a 14 to a 10 over the past month.   Her FAST score has gone from 6d prior to  the fall and even shortly after.  Now she is FAST 7b.  Niece kept her at her house last night and she says that she is not getting up without help and she is not repositioning herself in bed anymore.  She can walk with assistance once you stand her up She answers in only short responses and usually the same answer to everything like "I am fine."  This decline has happened over the past 2-3 weeks per the niece.     CODE STATUS: yes/dementia and history of CVA/TIAs/subdural hematoma  PPS: 30% HOSPICE ELIGIBILITY/DIAGNOSIS: TBD  PHYSICAL EXAM:   Deferred   PAST MEDICAL HISTORY:  Past Medical History:  Diagnosis Date  . Alzheimer's disease (HCC)   . Anxiety   . Dementia (HCC)   . Depression   . GERD (gastroesophageal reflux disease)   . Hallucinations   . Headache   . Heart beat abnormality   . Hypertension   . Kidney disease   . Osteoarthritis   . Stroke (HCC)   . TIA (transient ischemic attack)   . Vitamin B deficiency   . Vitamin D deficiency     SOCIAL HX:  Social History   Tobacco Use  . Smoking status: Never Smoker  . Smokeless tobacco: Never Used  Substance Use Topics  . Alcohol use: No    ALLERGIES: No Known Allergies   PERTINENT MEDICATIONS:  Outpatient Encounter Medications as of 04/20/2020  Medication Sig  . amLODipine (NORVASC) 5 MG tablet TAKE 1 TABLET EVERY DAY FOR BLOOD PRESSURE (Patient taking differently: Take 5 mg by mouth at bedtime.)  . citalopram (CELEXA) 20 MG tablet TAKE 1  TABLET EVERY DAY FOR ANXIETY (Patient taking differently: Take 20 mg by mouth daily.)  . polyethylene glycol (MIRALAX / GLYCOLAX) 17 g packet Take 17 g by mouth daily as needed for mild constipation.   No facility-administered encounter medications on file as of 04/20/2020.     Cathy Keeran Marlena Clipper, NP

## 2020-04-21 ENCOUNTER — Other Ambulatory Visit: Payer: Self-pay

## 2020-04-21 DIAGNOSIS — F0390 Unspecified dementia without behavioral disturbance: Secondary | ICD-10-CM | POA: Diagnosis not present

## 2020-04-21 DIAGNOSIS — I10 Essential (primary) hypertension: Secondary | ICD-10-CM | POA: Diagnosis not present

## 2020-04-21 DIAGNOSIS — S065X0D Traumatic subdural hemorrhage without loss of consciousness, subsequent encounter: Secondary | ICD-10-CM | POA: Diagnosis not present

## 2020-04-21 DIAGNOSIS — R1312 Dysphagia, oropharyngeal phase: Secondary | ICD-10-CM | POA: Diagnosis not present

## 2020-04-21 DIAGNOSIS — R2681 Unsteadiness on feet: Secondary | ICD-10-CM | POA: Diagnosis not present

## 2020-04-21 DIAGNOSIS — R488 Other symbolic dysfunctions: Secondary | ICD-10-CM | POA: Diagnosis not present

## 2020-04-21 DIAGNOSIS — M6281 Muscle weakness (generalized): Secondary | ICD-10-CM | POA: Diagnosis not present

## 2020-04-21 DIAGNOSIS — Z4789 Encounter for other orthopedic aftercare: Secondary | ICD-10-CM | POA: Diagnosis not present

## 2020-04-23 DIAGNOSIS — R488 Other symbolic dysfunctions: Secondary | ICD-10-CM | POA: Diagnosis not present

## 2020-04-23 DIAGNOSIS — R1312 Dysphagia, oropharyngeal phase: Secondary | ICD-10-CM | POA: Diagnosis not present

## 2020-04-23 DIAGNOSIS — Z4789 Encounter for other orthopedic aftercare: Secondary | ICD-10-CM | POA: Diagnosis not present

## 2020-04-23 DIAGNOSIS — M6281 Muscle weakness (generalized): Secondary | ICD-10-CM | POA: Diagnosis not present

## 2020-04-23 DIAGNOSIS — F0390 Unspecified dementia without behavioral disturbance: Secondary | ICD-10-CM | POA: Diagnosis not present

## 2020-04-23 DIAGNOSIS — R2681 Unsteadiness on feet: Secondary | ICD-10-CM | POA: Diagnosis not present

## 2020-04-23 DIAGNOSIS — S065X0D Traumatic subdural hemorrhage without loss of consciousness, subsequent encounter: Secondary | ICD-10-CM | POA: Diagnosis not present

## 2020-04-23 DIAGNOSIS — I10 Essential (primary) hypertension: Secondary | ICD-10-CM | POA: Diagnosis not present

## 2020-04-24 DIAGNOSIS — F0390 Unspecified dementia without behavioral disturbance: Secondary | ICD-10-CM | POA: Diagnosis not present

## 2020-04-24 DIAGNOSIS — F32A Depression, unspecified: Secondary | ICD-10-CM | POA: Diagnosis not present

## 2020-04-25 ENCOUNTER — Telehealth: Payer: Self-pay | Admitting: Adult Health Nurse Practitioner

## 2020-04-25 DIAGNOSIS — F329 Major depressive disorder, single episode, unspecified: Secondary | ICD-10-CM | POA: Diagnosis not present

## 2020-04-25 DIAGNOSIS — K219 Gastro-esophageal reflux disease without esophagitis: Secondary | ICD-10-CM | POA: Diagnosis not present

## 2020-04-25 DIAGNOSIS — Z4789 Encounter for other orthopedic aftercare: Secondary | ICD-10-CM | POA: Diagnosis not present

## 2020-04-25 DIAGNOSIS — R488 Other symbolic dysfunctions: Secondary | ICD-10-CM | POA: Diagnosis not present

## 2020-04-25 DIAGNOSIS — R2681 Unsteadiness on feet: Secondary | ICD-10-CM | POA: Diagnosis not present

## 2020-04-25 DIAGNOSIS — F0390 Unspecified dementia without behavioral disturbance: Secondary | ICD-10-CM | POA: Diagnosis not present

## 2020-04-25 DIAGNOSIS — I10 Essential (primary) hypertension: Secondary | ICD-10-CM | POA: Diagnosis not present

## 2020-04-25 DIAGNOSIS — R1312 Dysphagia, oropharyngeal phase: Secondary | ICD-10-CM | POA: Diagnosis not present

## 2020-04-25 DIAGNOSIS — S065X0D Traumatic subdural hemorrhage without loss of consciousness, subsequent encounter: Secondary | ICD-10-CM | POA: Diagnosis not present

## 2020-04-25 DIAGNOSIS — M1990 Unspecified osteoarthritis, unspecified site: Secondary | ICD-10-CM | POA: Diagnosis not present

## 2020-04-25 DIAGNOSIS — F411 Generalized anxiety disorder: Secondary | ICD-10-CM | POA: Diagnosis not present

## 2020-04-25 DIAGNOSIS — M6281 Muscle weakness (generalized): Secondary | ICD-10-CM | POA: Diagnosis not present

## 2020-04-25 DIAGNOSIS — N189 Chronic kidney disease, unspecified: Secondary | ICD-10-CM | POA: Diagnosis not present

## 2020-04-25 NOTE — Telephone Encounter (Signed)
This is a late entry.  Spoke with patient's niece, Denzil Magnuson, yesterday, 04/24/2020. Patient called our office yesterday requesting return call to update on hospice referral.  Spoke with niece and updated her on where we were out in the process.  Attempted to answer her questions.  Encouraged to call with any further questions or concerns Landan Fedie K. Garner Nash, NP

## 2020-05-01 DIAGNOSIS — M7989 Other specified soft tissue disorders: Secondary | ICD-10-CM | POA: Diagnosis not present

## 2020-06-15 DIAGNOSIS — F0390 Unspecified dementia without behavioral disturbance: Secondary | ICD-10-CM | POA: Diagnosis not present

## 2020-06-22 ENCOUNTER — Emergency Department (HOSPITAL_COMMUNITY)
Admission: EM | Admit: 2020-06-22 | Discharge: 2020-06-22 | Disposition: A | Discharge status: Home / Self Care | Attending: Emergency Medicine | Admitting: Emergency Medicine

## 2020-06-22 ENCOUNTER — Emergency Department (HOSPITAL_COMMUNITY)

## 2020-06-22 ENCOUNTER — Other Ambulatory Visit: Payer: Self-pay

## 2020-06-22 DIAGNOSIS — T07XXXA Unspecified multiple injuries, initial encounter: Secondary | ICD-10-CM | POA: Diagnosis not present

## 2020-06-22 DIAGNOSIS — W19XXXA Unspecified fall, initial encounter: Secondary | ICD-10-CM | POA: Insufficient documentation

## 2020-06-22 DIAGNOSIS — S52352A Displaced comminuted fracture of shaft of radius, left arm, initial encounter for closed fracture: Secondary | ICD-10-CM | POA: Diagnosis not present

## 2020-06-22 DIAGNOSIS — S6992XA Unspecified injury of left wrist, hand and finger(s), initial encounter: Secondary | ICD-10-CM | POA: Diagnosis present

## 2020-06-22 DIAGNOSIS — M25539 Pain in unspecified wrist: Secondary | ICD-10-CM | POA: Diagnosis not present

## 2020-06-22 DIAGNOSIS — Z79899 Other long term (current) drug therapy: Secondary | ICD-10-CM | POA: Insufficient documentation

## 2020-06-22 DIAGNOSIS — G309 Alzheimer's disease, unspecified: Secondary | ICD-10-CM | POA: Insufficient documentation

## 2020-06-22 DIAGNOSIS — R609 Edema, unspecified: Secondary | ICD-10-CM | POA: Diagnosis not present

## 2020-06-22 DIAGNOSIS — I10 Essential (primary) hypertension: Secondary | ICD-10-CM | POA: Diagnosis not present

## 2020-06-22 DIAGNOSIS — M2550 Pain in unspecified joint: Secondary | ICD-10-CM | POA: Diagnosis not present

## 2020-06-22 DIAGNOSIS — S52252A Displaced comminuted fracture of shaft of ulna, left arm, initial encounter for closed fracture: Secondary | ICD-10-CM | POA: Diagnosis not present

## 2020-06-22 DIAGNOSIS — Y92129 Unspecified place in nursing home as the place of occurrence of the external cause: Secondary | ICD-10-CM | POA: Diagnosis not present

## 2020-06-22 DIAGNOSIS — Z515 Encounter for palliative care: Secondary | ICD-10-CM | POA: Diagnosis not present

## 2020-06-22 DIAGNOSIS — S62102A Fracture of unspecified carpal bone, left wrist, initial encounter for closed fracture: Secondary | ICD-10-CM

## 2020-06-22 DIAGNOSIS — S52609A Unspecified fracture of lower end of unspecified ulna, initial encounter for closed fracture: Secondary | ICD-10-CM | POA: Diagnosis not present

## 2020-06-22 DIAGNOSIS — Z7401 Bed confinement status: Secondary | ICD-10-CM | POA: Diagnosis not present

## 2020-06-22 MED ORDER — MORPHINE SULFATE 10 MG/5ML PO SOLN
2.5000 mg | Freq: Once | ORAL | Status: AC
  Administered 2020-06-22: 2.5 mg via ORAL
  Filled 2020-06-22: qty 2

## 2020-06-22 NOTE — ED Triage Notes (Signed)
Pt brought in via [facility] with c/c wrist injury from [facility]. Per EMS PA at facility noticed swelling and deformity yesterday and completed xray that showed a left wrist fracture. Facility called 911 and want pt brought into hospital for future eval. Pt is followed by hospice.   68HR, 96%RA

## 2020-06-22 NOTE — Progress Notes (Signed)
Orthopedic Tech Progress Note Patient Details:  Cathy Rose October 30, 1924 [id number]  Ortho Devices Type of Ortho Device: Arm sling,Sugartong splint Ortho Device/Splint Location: LUE Ortho Device/Splint Interventions: Application,Ordered   Post Interventions Patient Tolerated: Well   Cathy Rose A Maximilian Tallo 06/22/2020, 11:28 AM

## 2020-06-22 NOTE — Discharge Instructions (Addendum)
Follow-up with orthopedic surgery in a week. Keep splint clean and dry. Elevate the left wrist above the level of the heart to help with pain and swelling. Ice can help as well with pain and swelling. Speak to hospice provider for recommendations for pain control.

## 2020-06-22 NOTE — ED Provider Notes (Signed)
[facility] EMERGENCY DEPARTMENT Provider Note   CSN: [id number] Arrival date & time: 06/22/20  0930     History Chief Complaint  Patient presents with  . Wrist Injury    Pt brought in via [facility] with c/c wrist injury from [facility]. Per EMS PA at facility noticed swelling and deformity yesterday and completed xray that showed a left wrist fracture. Facility called 911 and want pt brought into hospital for future eval. Pt is followed by hospice.   68HR, 96%RA     Cathy Rose is a 87 y.o. female.  HPI 87 year old female with extensive medical history as below on hospice presents the emergency department for left wrist injury.  Per EMS, patient is essentially nonverbal at baseline.  History and review of systems is limited by this.  Patient's POA, Ms. [name], was able to provide some history.  States that she noticed patient had swelling and deformity to left wrist yesterday.  Did not see any other traumatic injuries.  PA at the facility did an x-ray which showed fracture.  No reported falls, unclear etiology.  Patient is currently at her baseline.  Did discuss patient's hospice status with POA, she agrees to x-ray and splint for comfort.  Does not think further laboratory evaluation or imaging is within goals of care for patient.  Left message with patient's hospice worker, Shikira Folino, informing Ms. Paulsen of patient's being here.    Past Medical History:  Diagnosis Date  . Alzheimer's disease (HCC)   . Anxiety   . Dementia (HCC)   . Depression   . GERD (gastroesophageal reflux disease)   . Hallucinations   . Headache   . Heart beat abnormality   . Hypertension   . Kidney disease   . Osteoarthritis   . Stroke (HCC)   . TIA (transient ischemic attack)   . Vitamin B deficiency   . Vitamin D deficiency     Patient Active Problem List   Diagnosis Date Noted  . Acute subdural hematoma (HCC) 03/20/2020  . Fall at home, initial encounter  03/20/2020  . Primary osteoarthritis of left knee 09/22/2019  . Swelling of left lower extremity 09/22/2019  . Preventative health care 09/22/2019  . Pincer nail deformity 09/06/2019  . Weakness of both lower extremities 12/29/2018  . Essential hypertension 11/20/2017  . Normochromic anemia 06/04/2014  . H/O: CVA (cerebrovascular accident) 06/04/2014  . Dementia (HCC)     No past surgical history on file.   OB History   No obstetric history on file.     Family History  Problem Relation Age of Onset  . Stroke Father   . Colon cancer Sister   . Kidney disease Sister     Social History   Tobacco Use  . Smoking status: Never Smoker  . Smokeless tobacco: Never Used  Substance Use Topics  . Alcohol use: No  . Drug use: No    Home Medications Prior to Admission medications   Medication Sig Start Date End Date Taking? Authorizing Provider  amLODipine (NORVASC) 5 MG tablet TAKE 1 TABLET EVERY DAY FOR BLOOD PRESSURE Patient taking differently: Take 5 mg by mouth at bedtime. 09/22/19   Doreene Nest, NP  citalopram (CELEXA) 20 MG tablet TAKE 1 TABLET EVERY DAY FOR ANXIETY Patient taking differently: Take 20 mg by mouth daily. 09/22/19   Doreene Nest, NP  polyethylene glycol (MIRALAX / GLYCOLAX) 17 g packet Take 17 g by mouth daily as needed  for mild constipation. 03/23/20   Lanae Boast, MD    Allergies    Patient has no known allergies.  Review of Systems   Review of Systems  Unable to perform ROS: Dementia  Musculoskeletal: Positive for arthralgias and joint swelling.  All other systems reviewed and are negative.   Physical Exam Updated Vital Signs BP 138/69   Pulse (!) 57   Temp 97.6 F (36.4 C) (Oral)   Resp 16   Ht 4\' 9"  (1.448 m)   Wt 62.1 kg   SpO2 97%   BMI 29.63 kg/m   Physical Exam Vitals and nursing note reviewed.  Constitutional:      General: She is not in acute distress.    Appearance: She is well-developed.  HENT:     Head:  Normocephalic and atraumatic.  Eyes:     Conjunctiva/sclera: Conjunctivae normal.  Cardiovascular:     Rate and Rhythm: Normal rate and regular rhythm.     Heart sounds: No murmur heard.   Abdominal:     Palpations: Abdomen is soft.     Tenderness: There is no abdominal tenderness.  Musculoskeletal:     Cervical back: Neck supple.     Comments: Wincing with palpation of the left wrist with associated swelling.  Has bounding left radial pulse and preserved capillary refill.  Unable to assess for full range of motion and sensation due to patient's mental status.  Patient had no other wincing to palpation or range of motion of other joints.  No other obvious traumatic injuries.  Skin:    General: Skin is warm and dry.     Capillary Refill: Capillary refill takes less than 2 seconds.     Comments: Full skin exam showed no other bruising.  Very faint erythema to presacral area without obvious wound or infection.  Neurological:     Mental Status: Mental status is at baseline.  Psychiatric:     Comments: At baseline per [facility]     ED Results / Procedures / Treatments   Labs (all labs ordered are listed, but only abnormal results are displayed) Labs Reviewed - No data to display  EKG None  Radiology DG Wrist Complete Left  Result Date: 06/22/2020 CLINICAL DATA:  Fall with pain and deformity. EXAM: LEFT WRIST - COMPLETE 3+ VIEW COMPARISON:  None. FINDINGS: Comminuted displaced fracture of the distal radius with disruption of the articular surface and dorsal displacement of the main distal fragments relative to the proximal bone. Comminuted fracture also of the distal ulna involving the ulnar styloid. Widening of the scapholunate distance. Abnormal appearance of the scaphoid, possibly due to old trauma or resection. No acute carpal fracture identified. IMPRESSION: Acute comminuted fractures of the distal radius and ulna, extending to the articular surface of the radius, with dorsal  displacement of the distal radial articular surface. Probable chronic changes of widening of the scapholunate distance and post traumatic or postsurgical changes of the scaphoid. Electronically Signed   By: 06/24/2020 M.D.   On: 06/22/2020 10:22    Procedures Procedures   Medications Ordered in ED Medications  morphine 10 MG/5ML solution 2.5 mg (2.5 mg Oral Given 06/22/20 1106)    ED Course  I have reviewed the triage vital signs and the nursing notes.  Pertinent labs & imaging results that were available during my care of the patient were reviewed by me and considered in my medical decision making (see chart for details).    MDM Rules/Calculators/A&P  87 year old female presents to the emergency department for left wrist deformity.  Upon arrival, not in acute distress, vital signs are stable.  Exam shows minimally responsive 87 year old female.  Tenderness, swelling, deformity to left wrist.  To perform extensive skin and musculoskeletal exam with no other signs of traumatic injury.  As patient is on hospice, want to be mindful of goals of care.  Niece does want to focus on comfort, but thinks that x-ray of the wrist and possible splinting would be in line with those goals.  Other advanced imaging or labs would not be in line with patient's goals of care.  Differential includes: Strain, sprain, fracture, dislocation, elder abuse  Lower suspicion for elder abuse as there is no other acute traumatic injuries.  Additionally, niece states that patient has been very well taken care of and is facility and has low suspicion.  Xray shows left radius and ulnar fracture, displaced.  Due to the unstable appearance, will not attempt to reduce fracture.  Is mostly aligned.  Will place splint for comfort.  Morphine solution provided as well for comfort.  Patient stable for discharge.  Printed recommendations for symptomatic management and return precautions.  POA updated  by phone, in agreement with plan.  Recommended PCP follow-up as needed.  Final Clinical Impression(s) / ED Diagnoses Final diagnoses:  Closed fracture of left wrist, initial encounter    Rx / DC Orders ED Discharge Orders    None       Louretta Parma, DO 06/22/20 1121    Gerhard Munch, MD 06/22/20 1537

## 2020-06-27 DIAGNOSIS — M25532 Pain in left wrist: Secondary | ICD-10-CM | POA: Diagnosis not present

## 2020-07-05 DIAGNOSIS — S52502A Unspecified fracture of the lower end of left radius, initial encounter for closed fracture: Secondary | ICD-10-CM | POA: Diagnosis not present

## 2020-07-07 DIAGNOSIS — S52502A Unspecified fracture of the lower end of left radius, initial encounter for closed fracture: Secondary | ICD-10-CM | POA: Diagnosis not present

## 2020-07-07 DIAGNOSIS — S52502D Unspecified fracture of the lower end of left radius, subsequent encounter for closed fracture with routine healing: Secondary | ICD-10-CM | POA: Diagnosis not present

## 2020-07-14 DIAGNOSIS — S52502A Unspecified fracture of the lower end of left radius, initial encounter for closed fracture: Secondary | ICD-10-CM | POA: Diagnosis not present

## 2020-07-14 DIAGNOSIS — M25532 Pain in left wrist: Secondary | ICD-10-CM | POA: Diagnosis not present

## 2020-08-20 DIAGNOSIS — F0390 Unspecified dementia without behavioral disturbance: Secondary | ICD-10-CM | POA: Diagnosis not present

## 2020-08-20 DIAGNOSIS — R41841 Cognitive communication deficit: Secondary | ICD-10-CM | POA: Diagnosis not present

## 2020-08-20 DIAGNOSIS — S065X0D Traumatic subdural hemorrhage without loss of consciousness, subsequent encounter: Secondary | ICD-10-CM | POA: Diagnosis not present

## 2020-08-21 DIAGNOSIS — S065X0D Traumatic subdural hemorrhage without loss of consciousness, subsequent encounter: Secondary | ICD-10-CM | POA: Diagnosis not present

## 2020-08-21 DIAGNOSIS — R41841 Cognitive communication deficit: Secondary | ICD-10-CM | POA: Diagnosis not present

## 2020-08-21 DIAGNOSIS — F0390 Unspecified dementia without behavioral disturbance: Secondary | ICD-10-CM | POA: Diagnosis not present

## 2020-08-22 DIAGNOSIS — R41841 Cognitive communication deficit: Secondary | ICD-10-CM | POA: Diagnosis not present

## 2020-08-22 DIAGNOSIS — S065X0D Traumatic subdural hemorrhage without loss of consciousness, subsequent encounter: Secondary | ICD-10-CM | POA: Diagnosis not present

## 2020-08-22 DIAGNOSIS — F0390 Unspecified dementia without behavioral disturbance: Secondary | ICD-10-CM | POA: Diagnosis not present

## 2020-08-23 DIAGNOSIS — Z23 Encounter for immunization: Secondary | ICD-10-CM | POA: Diagnosis not present

## 2020-08-23 DIAGNOSIS — F0390 Unspecified dementia without behavioral disturbance: Secondary | ICD-10-CM | POA: Diagnosis not present

## 2020-08-23 DIAGNOSIS — R41841 Cognitive communication deficit: Secondary | ICD-10-CM | POA: Diagnosis not present

## 2020-08-23 DIAGNOSIS — S065X0D Traumatic subdural hemorrhage without loss of consciousness, subsequent encounter: Secondary | ICD-10-CM | POA: Diagnosis not present

## 2020-08-24 DIAGNOSIS — R41841 Cognitive communication deficit: Secondary | ICD-10-CM | POA: Diagnosis not present

## 2020-08-24 DIAGNOSIS — S065X0D Traumatic subdural hemorrhage without loss of consciousness, subsequent encounter: Secondary | ICD-10-CM | POA: Diagnosis not present

## 2020-08-24 DIAGNOSIS — F0390 Unspecified dementia without behavioral disturbance: Secondary | ICD-10-CM | POA: Diagnosis not present

## 2020-09-11 DIAGNOSIS — F32A Depression, unspecified: Secondary | ICD-10-CM | POA: Diagnosis not present

## 2020-09-11 DIAGNOSIS — F0390 Unspecified dementia without behavioral disturbance: Secondary | ICD-10-CM | POA: Diagnosis not present

## 2020-10-19 ENCOUNTER — Ambulatory Visit: Payer: Medicare HMO | Admitting: Podiatry

## 2020-10-31 ENCOUNTER — Encounter (HOSPITAL_COMMUNITY): Payer: Self-pay | Admitting: Emergency Medicine

## 2020-10-31 ENCOUNTER — Emergency Department (HOSPITAL_COMMUNITY)
Admission: EM | Admit: 2020-10-31 | Discharge: 2020-11-01 | Disposition: A | Discharge status: Home / Self Care | Attending: Emergency Medicine | Admitting: Emergency Medicine

## 2020-10-31 ENCOUNTER — Emergency Department (HOSPITAL_COMMUNITY)

## 2020-10-31 DIAGNOSIS — R0902 Hypoxemia: Secondary | ICD-10-CM | POA: Diagnosis not present

## 2020-10-31 DIAGNOSIS — S199XXA Unspecified injury of neck, initial encounter: Secondary | ICD-10-CM | POA: Diagnosis not present

## 2020-10-31 DIAGNOSIS — S0083XA Contusion of other part of head, initial encounter: Secondary | ICD-10-CM | POA: Insufficient documentation

## 2020-10-31 DIAGNOSIS — M5030 Other cervical disc degeneration, unspecified cervical region: Secondary | ICD-10-CM | POA: Diagnosis not present

## 2020-10-31 DIAGNOSIS — R9389 Abnormal findings on diagnostic imaging of other specified body structures: Secondary | ICD-10-CM | POA: Diagnosis not present

## 2020-10-31 DIAGNOSIS — S0993XA Unspecified injury of face, initial encounter: Secondary | ICD-10-CM | POA: Diagnosis not present

## 2020-10-31 DIAGNOSIS — G309 Alzheimer's disease, unspecified: Secondary | ICD-10-CM | POA: Diagnosis not present

## 2020-10-31 DIAGNOSIS — I517 Cardiomegaly: Secondary | ICD-10-CM | POA: Diagnosis not present

## 2020-10-31 DIAGNOSIS — S0990XA Unspecified injury of head, initial encounter: Secondary | ICD-10-CM | POA: Diagnosis present

## 2020-10-31 DIAGNOSIS — S0003XA Contusion of scalp, initial encounter: Secondary | ICD-10-CM | POA: Diagnosis not present

## 2020-10-31 DIAGNOSIS — W19XXXA Unspecified fall, initial encounter: Secondary | ICD-10-CM | POA: Insufficient documentation

## 2020-10-31 DIAGNOSIS — R296 Repeated falls: Secondary | ICD-10-CM | POA: Diagnosis not present

## 2020-10-31 DIAGNOSIS — Z043 Encounter for examination and observation following other accident: Secondary | ICD-10-CM | POA: Diagnosis not present

## 2020-10-31 DIAGNOSIS — M4312 Spondylolisthesis, cervical region: Secondary | ICD-10-CM | POA: Diagnosis not present

## 2020-10-31 DIAGNOSIS — K056 Periodontal disease, unspecified: Secondary | ICD-10-CM | POA: Diagnosis not present

## 2020-10-31 DIAGNOSIS — I6200 Nontraumatic subdural hemorrhage, unspecified: Secondary | ICD-10-CM | POA: Diagnosis not present

## 2020-10-31 DIAGNOSIS — K449 Diaphragmatic hernia without obstruction or gangrene: Secondary | ICD-10-CM | POA: Diagnosis not present

## 2020-10-31 DIAGNOSIS — Z79899 Other long term (current) drug therapy: Secondary | ICD-10-CM | POA: Diagnosis not present

## 2020-10-31 DIAGNOSIS — I10 Essential (primary) hypertension: Secondary | ICD-10-CM | POA: Diagnosis not present

## 2020-10-31 DIAGNOSIS — Y92129 Unspecified place in nursing home as the place of occurrence of the external cause: Secondary | ICD-10-CM | POA: Insufficient documentation

## 2020-10-31 DIAGNOSIS — I700 Atherosclerosis of aorta: Secondary | ICD-10-CM | POA: Diagnosis not present

## 2020-10-31 NOTE — ED Provider Notes (Signed)
Emergency Medicine Provider Triage Evaluation Note  Cathy Rose , a 87 y.o. female  was evaluated in triage.   History obtained from triage nurse Chestine Spore who is spoke with EMS.  Patient came in from [facility] found on the floor after fall with hematoma to the forehead.  Patient has a history of multiple falls, dementia normally alert and oriented x1, baseline mental status.  Patient without complaints.  DNR.  On my evaluation patient is awake, alert and oriented to self only.  She reports she feels well.  Speech is clear, she is pleasant.  She will move all 4 extremities with moderate range of motion secondary to age.  She has no concerns.  Review of Systems  Level 5 caveat dementia  Physical Exam  BP 108/85 (BP Location: Right Arm)   Pulse 71   Temp 97.6 F (36.4 C) (Oral)   Resp 14   SpO2 95%  Gen:   Awake, no distress   Resp:  Normal effort  MSK:   Moves extremities without difficulty  Other:  Hematoma forehead.  No TTP of the neck, back, chest, abdomen, pelvis.  Major joints including bilateral shoulders, elbows, wrists/hands, hips and knees mobilized without complaints of pain, no obvious deformity.  Patient denies pain with movement of this joint  Medical Decision Making  Medically screening exam initiated at 8:43 AM.  Appropriate orders placed.  Lyne Caliya Narine was informed that the remainder of the evaluation will be completed by another provider, this initial triage assessment does not replace that evaluation, and the importance of remaining in the ED until their evaluation is complete.   Note: Portions of this report may have been transcribed using voice recognition software. Every effort was made to ensure accuracy; however, inadvertent computerized transcription errors may still be present.    Bill Salinas, PA-C 10/31/20 0845    Wynetta Fines, MD 11/01/20 [phone]

## 2020-10-31 NOTE — ED Triage Notes (Signed)
Patient BIB [facility] from [facility] after being found on the floor, history of multiple falls. Patient has MOST form with her indicating DNR and comfort measures. Patient oriented to self only which is her baseline.  BP 110/70 HR 76

## 2020-11-01 DIAGNOSIS — R531 Weakness: Secondary | ICD-10-CM | POA: Diagnosis not present

## 2020-11-01 DIAGNOSIS — R5383 Other fatigue: Secondary | ICD-10-CM | POA: Diagnosis not present

## 2020-11-01 DIAGNOSIS — Z7401 Bed confinement status: Secondary | ICD-10-CM | POA: Diagnosis not present

## 2020-11-01 LAB — URINALYSIS, ROUTINE W REFLEX MICROSCOPIC
Bilirubin Urine: NEGATIVE
Glucose, UA: NEGATIVE mg/dL
Hgb urine dipstick: NEGATIVE
Ketones, ur: NEGATIVE mg/dL
Leukocytes,Ua: NEGATIVE
Nitrite: NEGATIVE
Protein, ur: NEGATIVE mg/dL
Specific Gravity, Urine: 1.011 (ref 1.005–1.030)
pH: 6 (ref 5.0–8.0)

## 2020-11-01 NOTE — Discharge Instructions (Addendum)
You were seen today after frequent falls.  Your x-rays do not show any obvious injury.  Your urinalysis is without urinary tract infection.  You do have an abnormality on your chest x-ray which could be a mass.  I discussed this with your power of attorney and niece and it was decided given your goals of care that we would not pursue further imaging

## 2020-11-01 NOTE — ED Provider Notes (Signed)
[facility] EMERGENCY DEPARTMENT Provider Note   CSN: [id number] Arrival date & time: 10/31/20  7425     History Chief Complaint  Patient presents with   Cathy Rose is a 87 y.o. female.  HPI    This a 87 year old female with a history of Alzheimer's dementia, hypertension, stroke who presents with fall.  Per triage note and EMS report, patient transferred from [facility] after being found on the floor.  Noted to have a small hematoma of the forehead.  History of recurrent falls.  Patient is only able to tell me her name.  She does not provide much history.  She cannot provide collateral information.  She denies any pain.  She has been waiting in the emergency room greater than 15 hours from initial presentation.  Level 5 caveat for dementia  Past Medical History:  Diagnosis Date   Alzheimer's disease (HCC)    Anxiety    Dementia (HCC)    Depression    GERD (gastroesophageal reflux disease)    Hallucinations    Headache    Heart beat abnormality    Hypertension    Kidney disease    Osteoarthritis    Stroke [facility])    TIA (transient ischemic attack)    Vitamin B deficiency    Vitamin D deficiency     Patient Active Problem List   Diagnosis Date Noted   Acute subdural hematoma 03/20/2020   Fall at home, initial encounter 03/20/2020   Primary osteoarthritis of left knee 09/22/2019   Swelling of left lower extremity 09/22/2019   Preventative health care 09/22/2019   Pincer nail deformity 09/06/2019   Weakness of both lower extremities 12/29/2018   Essential hypertension 11/20/2017   Normochromic anemia 06/04/2014   H/O: CVA (cerebrovascular accident) 06/04/2014   Dementia (HCC)     No past surgical history on file.   OB History   No obstetric history on file.     Family History  Problem Relation Age of Onset   Stroke Father    Colon cancer Sister    Kidney disease Sister     Social History   Tobacco Use   Smoking  status: Never   Smokeless tobacco: Never  Substance Use Topics   Alcohol use: No   Drug use: No    Home Medications Prior to Admission medications   Medication Sig Start Date End Date Taking? Authorizing Provider  acetaminophen (TYLENOL) 500 MG tablet Take 1,000 mg by mouth every 8 (eight) hours.   Yes [provider]  amLODipine (NORVASC) 5 MG tablet TAKE 1 TABLET EVERY DAY FOR BLOOD PRESSURE Patient taking differently: Take 5 mg by mouth daily. 09/22/19  Yes Doreene Nest, NP  citalopram (CELEXA) 20 MG tablet TAKE 1 TABLET EVERY DAY FOR ANXIETY Patient taking differently: Take 20 mg by mouth daily. 09/22/19  Yes Doreene Nest, NP  polyethylene glycol (MIRALAX / GLYCOLAX) 17 g packet Take 17 g by mouth daily as needed for mild constipation. 03/23/20  Yes Lanae Boast, MD    Allergies    Patient has no known allergies.  Review of Systems   Review of Systems  Unable to perform ROS: Dementia   Physical Exam Updated Vital Signs BP 126/73   Pulse 83   Temp 97.6 F (36.4 C) (Oral)   Resp 16   SpO2 98%   Physical Exam Vitals and nursing note reviewed.  Constitutional:      Appearance: She is  well-developed.     Comments: Elderly chronically ill-appearing  HENT:     Head: Normocephalic.     Comments: Small hematoma noted to forehead    Nose: Nose normal.  Eyes:     Pupils: Pupils are equal, round, and reactive to light.  Cardiovascular:     Rate and Rhythm: Normal rate and regular rhythm.     Heart sounds: Normal heart sounds.  Pulmonary:     Effort: Pulmonary effort is normal. No respiratory distress.     Breath sounds: No wheezing.  Abdominal:     Palpations: Abdomen is soft.     Tenderness: There is no abdominal tenderness.  Musculoskeletal:     Cervical back: Neck supple.     Comments: Kyphotic, generally contractured, normal range of motion bilateral hips and knees without obvious pain or deformity  Skin:    General: Skin is warm and dry.   Neurological:     Mental Status: She is alert.     Comments: Oriented to self  Psychiatric:     Comments: Unable to assess    ED Results / Procedures / Treatments   Labs (all labs ordered are listed, but only abnormal results are displayed) Labs Reviewed  URINALYSIS, ROUTINE W REFLEX MICROSCOPIC    EKG None  Radiology CT HEAD WO CONTRAST ( )  Result Date: 10/31/2020 CLINICAL DATA:  Head trauma, found on floor this morning EXAM: CT HEAD WITHOUT CONTRAST TECHNIQUE: Contiguous axial images were obtained from the base of the skull through the vertex without intravenous contrast. Sagittal and coronal MPR images reconstructed from axial data set. COMPARISON:  03/20/2020 FINDINGS: Brain: Generalized atrophy. Ex vacuo dilatation of the ventricular system. No midline shift or mass effect. Small vessel chronic ischemic changes of deep cerebral white matter. Dense calcification in the anterior falx. No intracranial hemorrhage, mass lesion, or evidence of acute infarction. No extra-axial fluid collections. Vascular: No hyperdense vessels. Skull: Demineralized but intact.  Small frontal scalp hematoma. Sinuses/Orbits: Opacification of LEFT sphenoid sinus. Remaining visualized paranasal sinuses and mastoid air cells clear Other: N/A IMPRESSION: Atrophy with small vessel chronic ischemic changes of deep cerebral white matter. No acute intracranial abnormalities. Small frontal scalp hematoma. Electronically Signed   By: Ulyses Southward M.D.   On: 10/31/2020 09:51   CT Cervical Spine Wo Contrast  Result Date: 10/31/2020 CLINICAL DATA:  Head trauma, found on floor this morning, neck trauma EXAM: CT CERVICAL SPINE WITHOUT CONTRAST TECHNIQUE: Multidetector CT imaging of the cervical spine was performed without intravenous contrast. Multiplanar CT image reconstructions were also generated. COMPARISON:  03/20/2020 FINDINGS: Alignment: Chronic anterolisthesis at C4-C5. Remaining alignments normal. Skull base and  vertebrae: Osseous demineralization. Skull base intact. Multilevel facet degenerative changes. Diffuse disc space narrowing and endplate spur formation. No fracture, subluxation, or bone destruction. Soft tissues and spinal canal: Prevertebral soft tissues normal thickness. Atherosclerotic calcifications aortic arch. Disc levels:  No specific abnormalities Upper chest: Lung apices clear Other: N/A IMPRESSION: Multilevel degenerative disc and facet disease changes of the cervical spine. Chronic anterolisthesis at C4-C5. No acute cervical spine abnormalities. Aortic Atherosclerosis (ICD10-I70.0). Electronically Signed   By: Ulyses Southward M.D.   On: 10/31/2020 10:06   DG Pelvis Portable  Result Date: 10/31/2020 CLINICAL DATA:  Fall. EXAM: PORTABLE PELVIS 1-2 VIEWS COMPARISON:  None. FINDINGS: There is no evidence of pelvic fracture or diastasis. No pelvic bone lesions are seen. IMPRESSION: Negative. Electronically Signed   By: Lupita Raider M.D.   On: 10/31/2020 09:34  DG Chest Portable 1 View  Result Date: 10/31/2020 CLINICAL DATA:  Fall. EXAM: PORTABLE CHEST 1 VIEW COMPARISON:  Jun 04, 2014. FINDINGS: Mild cardiomegaly is noted with probable central pulmonary vascular congestion. Left perihilar opacity is noted concerning for pneumonia or mass. Bilateral pulmonary edema or inflammation may be present. Probable large hiatal hernia. No pneumothorax or pleural effusion is noted. Bony thorax is unremarkable. IMPRESSION: Cardiomegaly with probable central pulmonary vascular congestion. Left perihilar opacity is noted which may represent pneumonia or possibly mass. CT scan of the chest is recommended for further evaluation. Large hiatal hernia. Probable bilateral pulmonary edema or inflammation. Electronically Signed   By: Lupita Raider M.D.   On: 10/31/2020 09:36   CT Maxillofacial Wo Contrast  Result Date: 10/31/2020 CLINICAL DATA:  Head trauma, found on floor this morning EXAM: CT MAXILLOFACIAL WITHOUT  CONTRAST TECHNIQUE: Multidetector CT imaging of the maxillofacial structures was performed. Multiplanar CT image reconstructions were also generated. No BB placed on the RIGHT side of head for confirmation of laterality. COMPARISON:  None FINDINGS: Osseous: Osseous demineralization. Beam hardening artifacts of dental origin. TMJ alignment normal. No facial bone fractures identified. LEFT maxillary periodontal disease noted. Orbits: Bony orbits intact.  Intraorbital soft tissue planes clear. Sinuses: Opacified LEFT sphenoid sinus. Remaining paranasal sinuses, mastoid air cells, and middle ear cavities clear. Soft tissues: Frontal scalp hematoma. Limited intracranial: No acute abnormalities. IMPRESSION: No acute facial bone abnormalities. Electronically Signed   By: Ulyses Southward M.D.   On: 10/31/2020 10:04    Procedures Procedures   Medications Ordered in ED Medications - No data to display  ED Course  I have reviewed the triage vital signs and the nursing notes.  Pertinent labs & imaging results that were available during my care of the patient were reviewed by me and considered in my medical decision making (see chart for details).    MDM Rules/Calculators/A&P                           Patient presents following a reported fall.  On my assessment, x-rays and CT imaging had already been done.  CT head neck without any obvious injuries.  X-rays of the chest and pelvis reviewed.  Patient with possible mass on chest x-ray.  Recommend advanced imaging.  Patient has a goals of care form at the bedside which indicate DNR and comfort care.  I was able to speak to her niece, [name], who is her next of kin.  We discussed whether pursuing further work-up was in line with the patient's goals of comfort given her significant dementia and age.  She does not wish to pursue any further work-up.  Feel this is reasonable.  I did obtain a urine to rule out UTI.  Urine does not show any obvious infection.  At baseline  patient should be in a wheelchair so she was not able to be ambulated prior to discharge.  After history, exam, and medical workup I feel the patient has been appropriately medically screened and is safe for discharge home. Pertinent diagnoses were discussed with the patient. Patient was given return precautions.  Final Clinical Impression(s) / ED Diagnoses Final diagnoses:  Frequent falls  Abnormal x-ray    Rx / DC Orders ED Discharge Orders     None        Shon Baton, MD 11/01/20 [phone]

## 2020-11-19 DIAGNOSIS — S065X0D Traumatic subdural hemorrhage without loss of consciousness, subsequent encounter: Secondary | ICD-10-CM | POA: Diagnosis not present

## 2020-11-19 DIAGNOSIS — F0390 Unspecified dementia without behavioral disturbance: Secondary | ICD-10-CM | POA: Diagnosis not present

## 2020-11-19 DIAGNOSIS — M6281 Muscle weakness (generalized): Secondary | ICD-10-CM | POA: Diagnosis not present

## 2020-11-19 DIAGNOSIS — R41841 Cognitive communication deficit: Secondary | ICD-10-CM | POA: Diagnosis not present

## 2020-11-19 DIAGNOSIS — R1312 Dysphagia, oropharyngeal phase: Secondary | ICD-10-CM | POA: Diagnosis not present

## 2020-11-20 DIAGNOSIS — S065X0D Traumatic subdural hemorrhage without loss of consciousness, subsequent encounter: Secondary | ICD-10-CM | POA: Diagnosis not present

## 2020-11-20 DIAGNOSIS — M6281 Muscle weakness (generalized): Secondary | ICD-10-CM | POA: Diagnosis not present

## 2020-11-20 DIAGNOSIS — R41841 Cognitive communication deficit: Secondary | ICD-10-CM | POA: Diagnosis not present

## 2020-11-20 DIAGNOSIS — F0390 Unspecified dementia without behavioral disturbance: Secondary | ICD-10-CM | POA: Diagnosis not present

## 2020-11-20 DIAGNOSIS — R1312 Dysphagia, oropharyngeal phase: Secondary | ICD-10-CM | POA: Diagnosis not present

## 2020-11-21 DIAGNOSIS — R1312 Dysphagia, oropharyngeal phase: Secondary | ICD-10-CM | POA: Diagnosis not present

## 2020-11-21 DIAGNOSIS — F0390 Unspecified dementia without behavioral disturbance: Secondary | ICD-10-CM | POA: Diagnosis not present

## 2020-11-21 DIAGNOSIS — M6281 Muscle weakness (generalized): Secondary | ICD-10-CM | POA: Diagnosis not present

## 2020-11-21 DIAGNOSIS — R41841 Cognitive communication deficit: Secondary | ICD-10-CM | POA: Diagnosis not present

## 2020-11-21 DIAGNOSIS — S065X0D Traumatic subdural hemorrhage without loss of consciousness, subsequent encounter: Secondary | ICD-10-CM | POA: Diagnosis not present

## 2020-11-22 DIAGNOSIS — R1312 Dysphagia, oropharyngeal phase: Secondary | ICD-10-CM | POA: Diagnosis not present

## 2020-11-22 DIAGNOSIS — M6281 Muscle weakness (generalized): Secondary | ICD-10-CM | POA: Diagnosis not present

## 2020-11-22 DIAGNOSIS — S065X0D Traumatic subdural hemorrhage without loss of consciousness, subsequent encounter: Secondary | ICD-10-CM | POA: Diagnosis not present

## 2020-11-22 DIAGNOSIS — R41841 Cognitive communication deficit: Secondary | ICD-10-CM | POA: Diagnosis not present

## 2020-11-22 DIAGNOSIS — F0390 Unspecified dementia without behavioral disturbance: Secondary | ICD-10-CM | POA: Diagnosis not present

## 2020-11-23 DIAGNOSIS — S065X0D Traumatic subdural hemorrhage without loss of consciousness, subsequent encounter: Secondary | ICD-10-CM | POA: Diagnosis not present

## 2020-11-23 DIAGNOSIS — R41841 Cognitive communication deficit: Secondary | ICD-10-CM | POA: Diagnosis not present

## 2020-11-23 DIAGNOSIS — M6281 Muscle weakness (generalized): Secondary | ICD-10-CM | POA: Diagnosis not present

## 2020-11-23 DIAGNOSIS — F0390 Unspecified dementia without behavioral disturbance: Secondary | ICD-10-CM | POA: Diagnosis not present

## 2020-11-23 DIAGNOSIS — R1312 Dysphagia, oropharyngeal phase: Secondary | ICD-10-CM | POA: Diagnosis not present

## 2020-11-26 DIAGNOSIS — R1312 Dysphagia, oropharyngeal phase: Secondary | ICD-10-CM | POA: Diagnosis not present

## 2020-11-26 DIAGNOSIS — S065X0D Traumatic subdural hemorrhage without loss of consciousness, subsequent encounter: Secondary | ICD-10-CM | POA: Diagnosis not present

## 2020-11-26 DIAGNOSIS — F0390 Unspecified dementia without behavioral disturbance: Secondary | ICD-10-CM | POA: Diagnosis not present

## 2020-11-26 DIAGNOSIS — R41841 Cognitive communication deficit: Secondary | ICD-10-CM | POA: Diagnosis not present

## 2020-11-26 DIAGNOSIS — M6281 Muscle weakness (generalized): Secondary | ICD-10-CM | POA: Diagnosis not present

## 2020-12-01 ENCOUNTER — Encounter (HOSPITAL_COMMUNITY): Payer: Self-pay | Admitting: Radiology

## 2021-01-01 ENCOUNTER — Encounter: Payer: Self-pay | Admitting: Podiatry

## 2021-01-01 ENCOUNTER — Ambulatory Visit (INDEPENDENT_AMBULATORY_CARE_PROVIDER_SITE_OTHER): Payer: Medicare HMO | Admitting: Podiatry

## 2021-01-01 ENCOUNTER — Other Ambulatory Visit: Payer: Self-pay

## 2021-01-01 DIAGNOSIS — M79674 Pain in right toe(s): Secondary | ICD-10-CM | POA: Diagnosis not present

## 2021-01-01 DIAGNOSIS — M79675 Pain in left toe(s): Secondary | ICD-10-CM

## 2021-01-01 DIAGNOSIS — B351 Tinea unguium: Secondary | ICD-10-CM | POA: Diagnosis not present

## 2021-01-01 DIAGNOSIS — L608 Other nail disorders: Secondary | ICD-10-CM

## 2021-01-01 NOTE — Progress Notes (Signed)
This patient returns to my office for at risk foot care.  This patient requires this care by a professional since this patient will be at risk due to having dementia and kidney disease.   This patient is unable to cut nails herself since the patient cannot reach her nails.These nails are painful walking and wearing shoes.  This patient presents for at risk foot care today. Patient is brought to the office by her niece.  General Appearance  Alert, conversant and in no acute stress.  Vascular  Dorsalis pedis and posterior tibial  pulses are weakly  palpable  bilaterally.  Capillary return is within normal limits  bilaterally. Temperature is within normal limits  bilaterally.  Neurologic  Senn-Weinstein monofilament wire test within normal limits  bilaterally. Muscle power within normal limits bilaterally.  Nails Thick disfigured discolored nails with subungual debris  from hallux to fifth toes bilaterally. No evidence of bacterial infection or drainage bilaterally. Pincer nails  B/L.  Orthopedic  No limitations of motion  feet .  No crepitus or effusions noted.  No bony pathology or digital deformities noted. HAV  B/L.  Skin  normotropic skin with no porokeratosis noted bilaterally.  No signs of infections or ulcers noted.     Onychomycosis  Pain in right toes  Pain in left toes  Consent was obtained for treatment procedures.   Mechanical debridement of nails 1-5  bilaterally performed with a nail nipper.  Filed with dremel without incident.    Return office visit  prn                 Told patient to return for periodic foot care and evaluation due to potential at risk complications.   Helane Gunther DPM

## 2021-04-01 ENCOUNTER — Emergency Department (HOSPITAL_COMMUNITY)

## 2021-04-01 ENCOUNTER — Other Ambulatory Visit: Payer: Self-pay

## 2021-04-01 ENCOUNTER — Emergency Department (HOSPITAL_COMMUNITY)
Admission: EM | Admit: 2021-04-01 | Discharge: 2021-04-01 | Disposition: A | Discharge status: Skilled Nursing Facility | Attending: Emergency Medicine | Admitting: Emergency Medicine

## 2021-04-01 DIAGNOSIS — R58 Hemorrhage, not elsewhere classified: Secondary | ICD-10-CM | POA: Diagnosis not present

## 2021-04-01 DIAGNOSIS — S0003XA Contusion of scalp, initial encounter: Secondary | ICD-10-CM | POA: Insufficient documentation

## 2021-04-01 DIAGNOSIS — F0390 Unspecified dementia without behavioral disturbance: Secondary | ICD-10-CM | POA: Insufficient documentation

## 2021-04-01 DIAGNOSIS — Y92129 Unspecified place in nursing home as the place of occurrence of the external cause: Secondary | ICD-10-CM | POA: Insufficient documentation

## 2021-04-01 DIAGNOSIS — I10 Essential (primary) hypertension: Secondary | ICD-10-CM | POA: Diagnosis not present

## 2021-04-01 DIAGNOSIS — Z79899 Other long term (current) drug therapy: Secondary | ICD-10-CM | POA: Diagnosis not present

## 2021-04-01 DIAGNOSIS — S0990XA Unspecified injury of head, initial encounter: Secondary | ICD-10-CM | POA: Diagnosis not present

## 2021-04-01 DIAGNOSIS — W19XXXA Unspecified fall, initial encounter: Secondary | ICD-10-CM

## 2021-04-01 DIAGNOSIS — R410 Disorientation, unspecified: Secondary | ICD-10-CM | POA: Diagnosis not present

## 2021-04-01 DIAGNOSIS — Z7401 Bed confinement status: Secondary | ICD-10-CM | POA: Diagnosis not present

## 2021-04-01 MED ORDER — ACETAMINOPHEN 325 MG PO TABS
650.0000 mg | ORAL_TABLET | Freq: Once | ORAL | Status: AC
  Administered 2021-04-01: 650 mg via ORAL
  Filled 2021-04-01: qty 2

## 2021-04-01 NOTE — ED Provider Notes (Signed)
[facility] EMERGENCY DEPARTMENT Provider Note   CSN: [id number] Arrival date & time: 04/01/21  1701     History  Chief Complaint  Patient presents with   Cathy Rose is a 88 y.o. female history of dementia, hypertension, here presenting with fall.  Patient is from a nursing home, [facility].  Patient is wheelchair-bound apparently fell forward on her wheelchair and hit her head.  Patient unable to give much history.  Patient does have a MOST form and she is comfort care only.  Patient was noted to have a large right scalp hematoma  The history is provided by the EMS personnel.      Home Medications Prior to Admission medications   Medication Sig Start Date End Date Taking? Authorizing Provider  acetaminophen (TYLENOL) 500 MG tablet Take 1,000 mg by mouth every 8 (eight) hours.    [provider]  amLODipine (NORVASC) 5 MG tablet TAKE 1 TABLET EVERY DAY FOR BLOOD PRESSURE Patient taking differently: Take 5 mg by mouth daily. 09/22/19   Pleas Koch, NP  citalopram (CELEXA) 20 MG tablet TAKE 1 TABLET EVERY DAY FOR ANXIETY Patient taking differently: Take 20 mg by mouth daily. 09/22/19   Pleas Koch, NP  polyethylene glycol (MIRALAX / GLYCOLAX) 17 g packet Take 17 g by mouth daily as needed for mild constipation. 03/23/20   Antonieta Pert, MD      Allergies    Patient has no known allergies.    Review of Systems   Review of Systems  Skin:  Positive for wound.  All other systems reviewed and are negative.  Physical Exam Updated Vital Signs BP (!) 158/70    Pulse 68    Temp (!) 97.5 F (36.4 C) (Oral)    Resp 12    SpO2 100%  Physical Exam Vitals and nursing note reviewed.  Constitutional:      Comments: Chronically ill, demented   HENT:     Head:     Comments: + R frontal scalp hematoma above R eye     Nose: Nose normal.     Mouth/Throat:     Mouth: Mucous membranes are moist.  Eyes:     Comments: Extra ocular  movements intact. No obvious hyphema   Cardiovascular:     Rate and Rhythm: Normal rate and regular rhythm.     Pulses: Normal pulses.     Heart sounds: Normal heart sounds.  Pulmonary:     Effort: Pulmonary effort is normal.     Breath sounds: Normal breath sounds.  Abdominal:     General: Abdomen is flat.     Palpations: Abdomen is soft.  Musculoskeletal:     Cervical back: Normal range of motion and neck supple.     Comments: No midline spinal tenderness.  Normal range of motion bilateral hips.  No obvious extremity trauma  Skin:    General: Skin is warm.     Capillary Refill: Capillary refill takes less than 2 seconds.  Neurological:     Comments: Demented, moving all extremities, unable to follow commands.  Psychiatric:     Comments: Unable     ED Results / Procedures / Treatments   Labs (all labs ordered are listed, but only abnormal results are displayed) Labs Reviewed - No data to display  EKG None  Radiology CT HEAD WO CONTRAST (5MM)  Result Date: 04/01/2021 CLINICAL DATA:  Facial trauma, right forehead hematoma EXAM: CT HEAD WITHOUT  CONTRAST CT MAXILLOFACIAL WITHOUT CONTRAST CT CERVICAL SPINE WITHOUT CONTRAST TECHNIQUE: Multidetector CT imaging of the head, cervical spine, and maxillofacial structures were performed using the standard protocol without intravenous contrast. Multiplanar CT image reconstructions of the cervical spine and maxillofacial structures were also generated. RADIATION DOSE REDUCTION: This exam was performed according to the departmental dose-optimization program which includes automated exposure control, adjustment of the mA and/or kV according to patient size and/or use of iterative reconstruction technique. COMPARISON:  10/31/2020 FINDINGS: CT HEAD FINDINGS Brain: No evidence of acute infarction, hemorrhage, hydrocephalus, extra-axial collection or mass lesion/mass effect. Mild periventricular white matter hypodensity and global cerebral volume loss.  Vascular: No hyperdense vessel or unexpected calcification. CT FACIAL BONES FINDINGS Skull: Normal. Negative for fracture or focal lesion. Facial bones: No displaced fractures or dislocations. Sinuses/Orbits: No acute finding. Other: Large superficial hematoma of the right forehead (series 3, image 31). CT CERVICAL SPINE FINDINGS Alignment: Minimal multilevel degenerative anterolisthesis. Skull base and vertebrae: No acute fracture. No primary bone lesion or focal pathologic process. Soft tissues and spinal canal: No prevertebral fluid or swelling. No visible canal hematoma. Disc levels: Mild to moderate multilevel disc space height loss and osteophytosis throughout, worst at C6-C7. Upper chest: Negative. Other: None. IMPRESSION: 1. No acute intracranial pathology. Small-vessel white matter disease and global cerebral volume loss. 2. Soft tissue hematoma of the right forehead. 3. No displaced fracture or dislocation of the facial bones. 4. No fracture or static subluxation of the cervical spine. Multilevel cervical disc degenerative disease. Electronically Signed   By: Delanna Ahmadi M.D.   On: 04/01/2021 17:59   CT Cervical Spine Wo Contrast  Result Date: 04/01/2021 CLINICAL DATA:  Facial trauma, right forehead hematoma EXAM: CT HEAD WITHOUT CONTRAST CT MAXILLOFACIAL WITHOUT CONTRAST CT CERVICAL SPINE WITHOUT CONTRAST TECHNIQUE: Multidetector CT imaging of the head, cervical spine, and maxillofacial structures were performed using the standard protocol without intravenous contrast. Multiplanar CT image reconstructions of the cervical spine and maxillofacial structures were also generated. RADIATION DOSE REDUCTION: This exam was performed according to the departmental dose-optimization program which includes automated exposure control, adjustment of the mA and/or kV according to patient size and/or use of iterative reconstruction technique. COMPARISON:  10/31/2020 FINDINGS: CT HEAD FINDINGS Brain: No evidence of  acute infarction, hemorrhage, hydrocephalus, extra-axial collection or mass lesion/mass effect. Mild periventricular white matter hypodensity and global cerebral volume loss. Vascular: No hyperdense vessel or unexpected calcification. CT FACIAL BONES FINDINGS Skull: Normal. Negative for fracture or focal lesion. Facial bones: No displaced fractures or dislocations. Sinuses/Orbits: No acute finding. Other: Large superficial hematoma of the right forehead (series 3, image 31). CT CERVICAL SPINE FINDINGS Alignment: Minimal multilevel degenerative anterolisthesis. Skull base and vertebrae: No acute fracture. No primary bone lesion or focal pathologic process. Soft tissues and spinal canal: No prevertebral fluid or swelling. No visible canal hematoma. Disc levels: Mild to moderate multilevel disc space height loss and osteophytosis throughout, worst at C6-C7. Upper chest: Negative. Other: None. IMPRESSION: 1. No acute intracranial pathology. Small-vessel white matter disease and global cerebral volume loss. 2. Soft tissue hematoma of the right forehead. 3. No displaced fracture or dislocation of the facial bones. 4. No fracture or static subluxation of the cervical spine. Multilevel cervical disc degenerative disease. Electronically Signed   By: Delanna Ahmadi M.D.   On: 04/01/2021 17:59   CT Maxillofacial Wo Contrast  Result Date: 04/01/2021 CLINICAL DATA:  Facial trauma, right forehead hematoma EXAM: CT HEAD WITHOUT CONTRAST CT MAXILLOFACIAL WITHOUT CONTRAST CT CERVICAL  SPINE WITHOUT CONTRAST TECHNIQUE: Multidetector CT imaging of the head, cervical spine, and maxillofacial structures were performed using the standard protocol without intravenous contrast. Multiplanar CT image reconstructions of the cervical spine and maxillofacial structures were also generated. RADIATION DOSE REDUCTION: This exam was performed according to the departmental dose-optimization program which includes automated exposure control,  adjustment of the mA and/or kV according to patient size and/or use of iterative reconstruction technique. COMPARISON:  10/31/2020 FINDINGS: CT HEAD FINDINGS Brain: No evidence of acute infarction, hemorrhage, hydrocephalus, extra-axial collection or mass lesion/mass effect. Mild periventricular white matter hypodensity and global cerebral volume loss. Vascular: No hyperdense vessel or unexpected calcification. CT FACIAL BONES FINDINGS Skull: Normal. Negative for fracture or focal lesion. Facial bones: No displaced fractures or dislocations. Sinuses/Orbits: No acute finding. Other: Large superficial hematoma of the right forehead (series 3, image 31). CT CERVICAL SPINE FINDINGS Alignment: Minimal multilevel degenerative anterolisthesis. Skull base and vertebrae: No acute fracture. No primary bone lesion or focal pathologic process. Soft tissues and spinal canal: No prevertebral fluid or swelling. No visible canal hematoma. Disc levels: Mild to moderate multilevel disc space height loss and osteophytosis throughout, worst at C6-C7. Upper chest: Negative. Other: None. IMPRESSION: 1. No acute intracranial pathology. Small-vessel white matter disease and global cerebral volume loss. 2. Soft tissue hematoma of the right forehead. 3. No displaced fracture or dislocation of the facial bones. 4. No fracture or static subluxation of the cervical spine. Multilevel cervical disc degenerative disease. Electronically Signed   By: Delanna Ahmadi M.D.   On: 04/01/2021 17:59    Procedures Procedures    Medications Ordered in ED Medications  acetaminophen (TYLENOL) tablet 650 mg (650 mg Oral Given 04/01/21 1726)    ED Course/ Medical Decision Making/ A&P                           Medical Decision Making Cathy Rose is a 88 y.o. female here presenting with a fall.  Patient is demented and DNR.  Patient likely has a mechanical fall and fell forward from a wheelchair.  We will get CT head and cervical spine and  maxillofacial to rule out bleed or fracture.  We will get chest and pelvis x-rays.  No other signs of extremity or back trauma  6:47 PM CT showed no fracture or intracranial bleeding.  Patient just has a scalp hematoma.  I updated the proxy, the niece, who lives in [place].  Patient is stable for discharge back to facility  Problems Addressed: Fall, initial encounter: acute illness or injury Scalp hematoma, initial encounter: acute illness or injury  Amount and/or Complexity of Data Reviewed Labs: ordered. Decision-making details documented in ED Course. Radiology: ordered and independent interpretation performed. Decision-making details documented in ED Course.  Risk OTC drugs.  Final Clinical Impression(s) / ED Diagnoses Final diagnoses:  None    Rx / DC Orders ED Discharge Orders     None         Drenda Freeze, MD 04/01/21 [phone]

## 2021-04-01 NOTE — Progress Notes (Signed)
[employer] engineer, [employer] [facility]) Hospital Liaison Note ? ?This patient is a current hospice patient with [facility], admitted with a terminal diagnosis of alzheimer's dementia.  ?  ?[facility] will continue to follow for any discharge planning needs and to coordinate continuation of hospice care.  ? ?Please don't hesitate to call with any Hospice related questions or concerns.  ?  ?Thank you for the opportunity to participate in this patient's care. ? ?Dionicio Stall, LCSW ?[facility] Hospital Liaison ?[phone] ?

## 2021-04-01 NOTE — ED Notes (Signed)
Patient transported to CT 

## 2021-04-01 NOTE — ED Triage Notes (Signed)
Pt bib [facility] from [facility] where she had an unwitnessed fall and was found on the floor. Pt arrives with large hematoma on right forehead. Pt is not on blood thinners. Dementia and wheelchair bound at baseline. ?EMS vitals: 140/90, 127CBG, 68HR, 97% RA ?

## 2021-04-01 NOTE — Discharge Instructions (Addendum)
You have a scalp hematoma.  I expect the area to become black and blue ? ?Fall precautions at the facility ? ?Take tylenol 500 mg every 6 hrs for pain  ? ?See your doctor for follow up ? ?Return to ER if you have worse pain, headache, vomiting, another fall  ?

## 2021-04-01 NOTE — ED Notes (Signed)
RN called [facility].  

## 2022-03-29 IMAGING — DX DG CHEST 1V PORT
1 series · 1 of 1 positions shown · non-contrast
Comparison: October 31, 2020

CLINICAL DATA: fall

EXAM:
PORTABLE CHEST 1 VIEW

[chest]
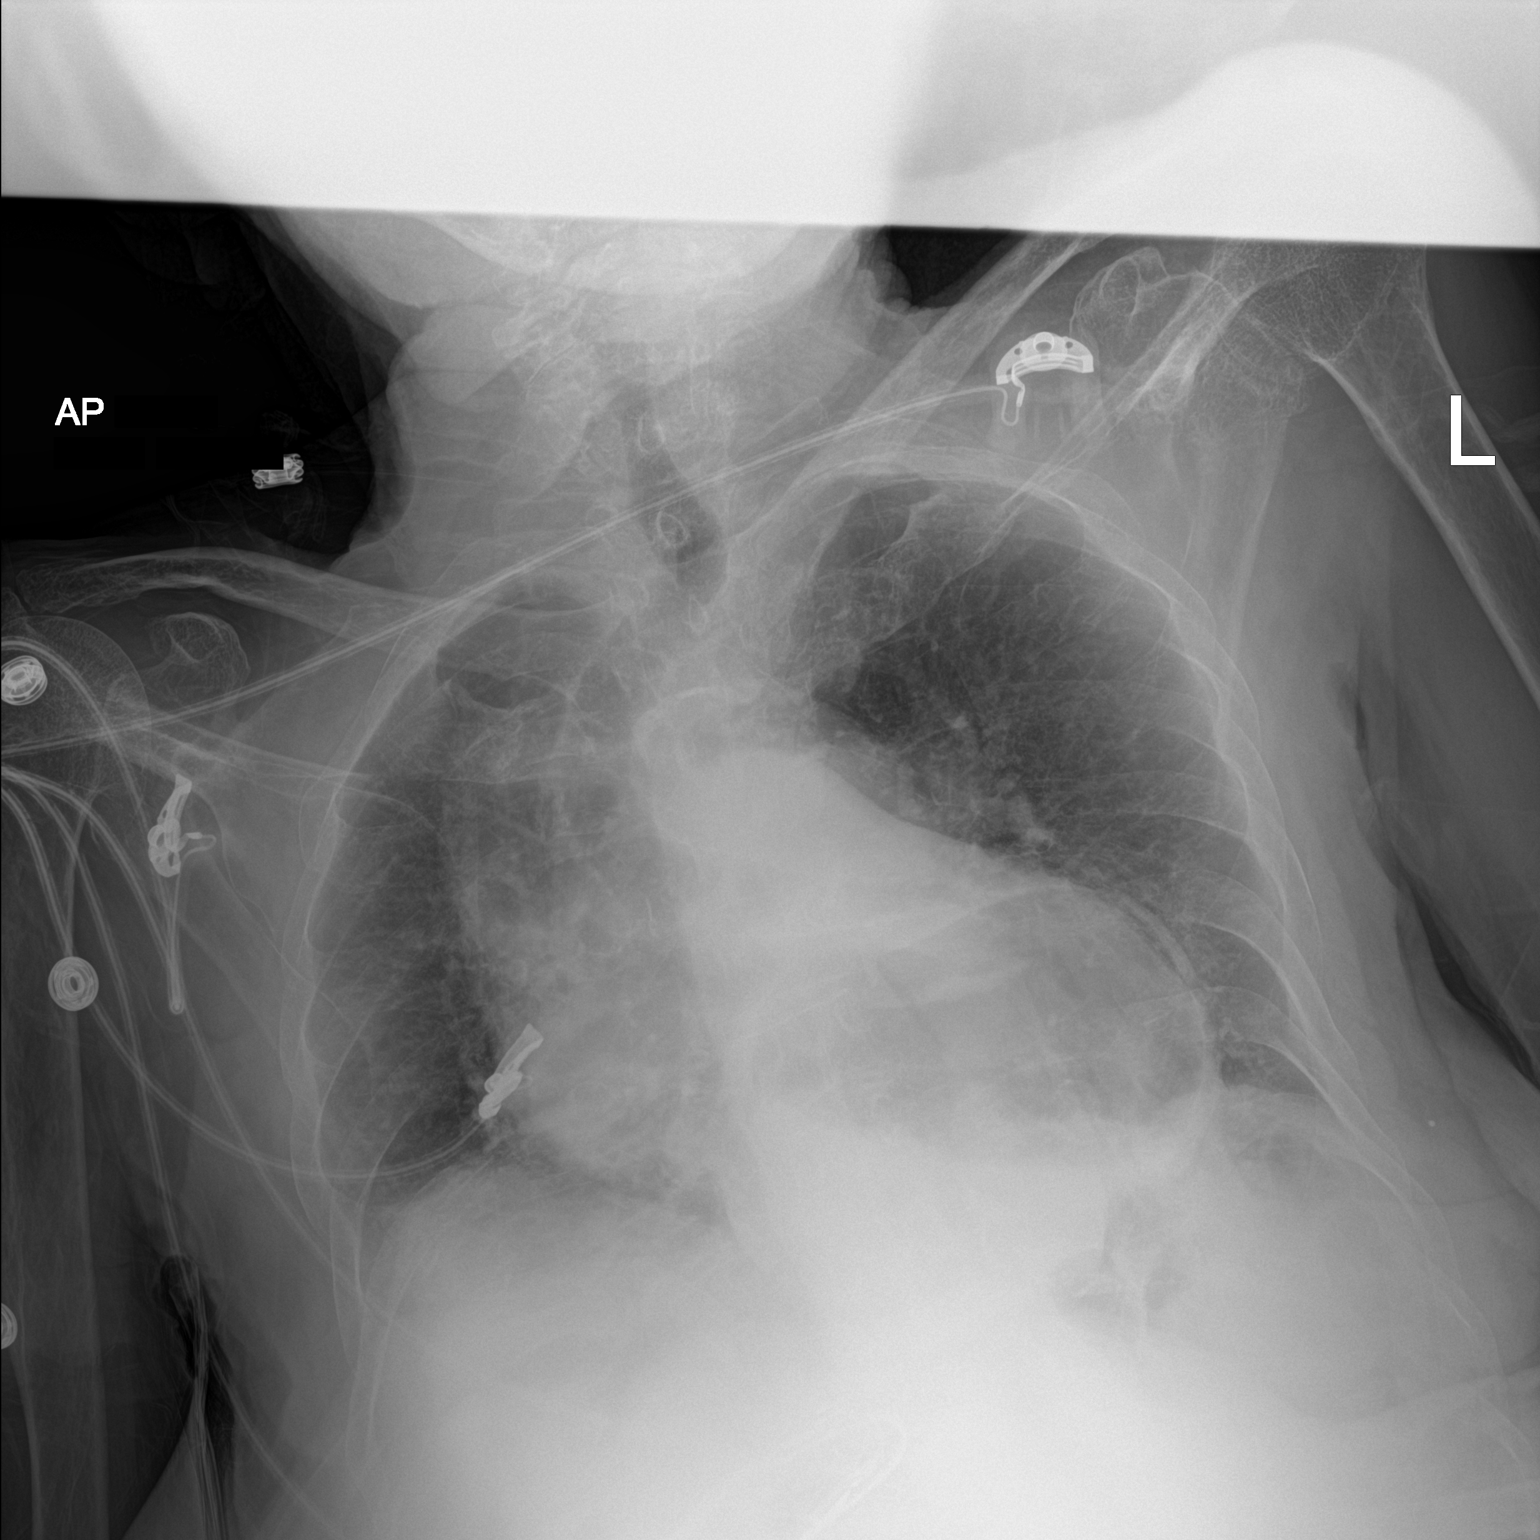

[1 of 1 positions shown; findings below may reference images not displayed]

FINDINGS: Evaluation is limited by patient rotation. The cardiomediastinal
silhouette is unchanged and enlarged in contour.Large hiatal hernia.
No pleural effusion. No pneumothorax. No acute pleuroparenchymal
abnormality. Visualized abdomen is unremarkable. Osteopenia.
IMPRESSION: No acute cardiopulmonary abnormality.  Large hiatal hernia

## 2022-03-29 IMAGING — DX DG PORTABLE PELVIS
1 series · 2 of 2 positions shown · non-contrast
Comparison: October 31, 2020.

CLINICAL DATA: fall

EXAM:
PORTABLE PELVIS 1-2 VIEWS

[Series 1: pelvis · 0.14mm/px · 2 of 2 slices shown]
[im 1/2]
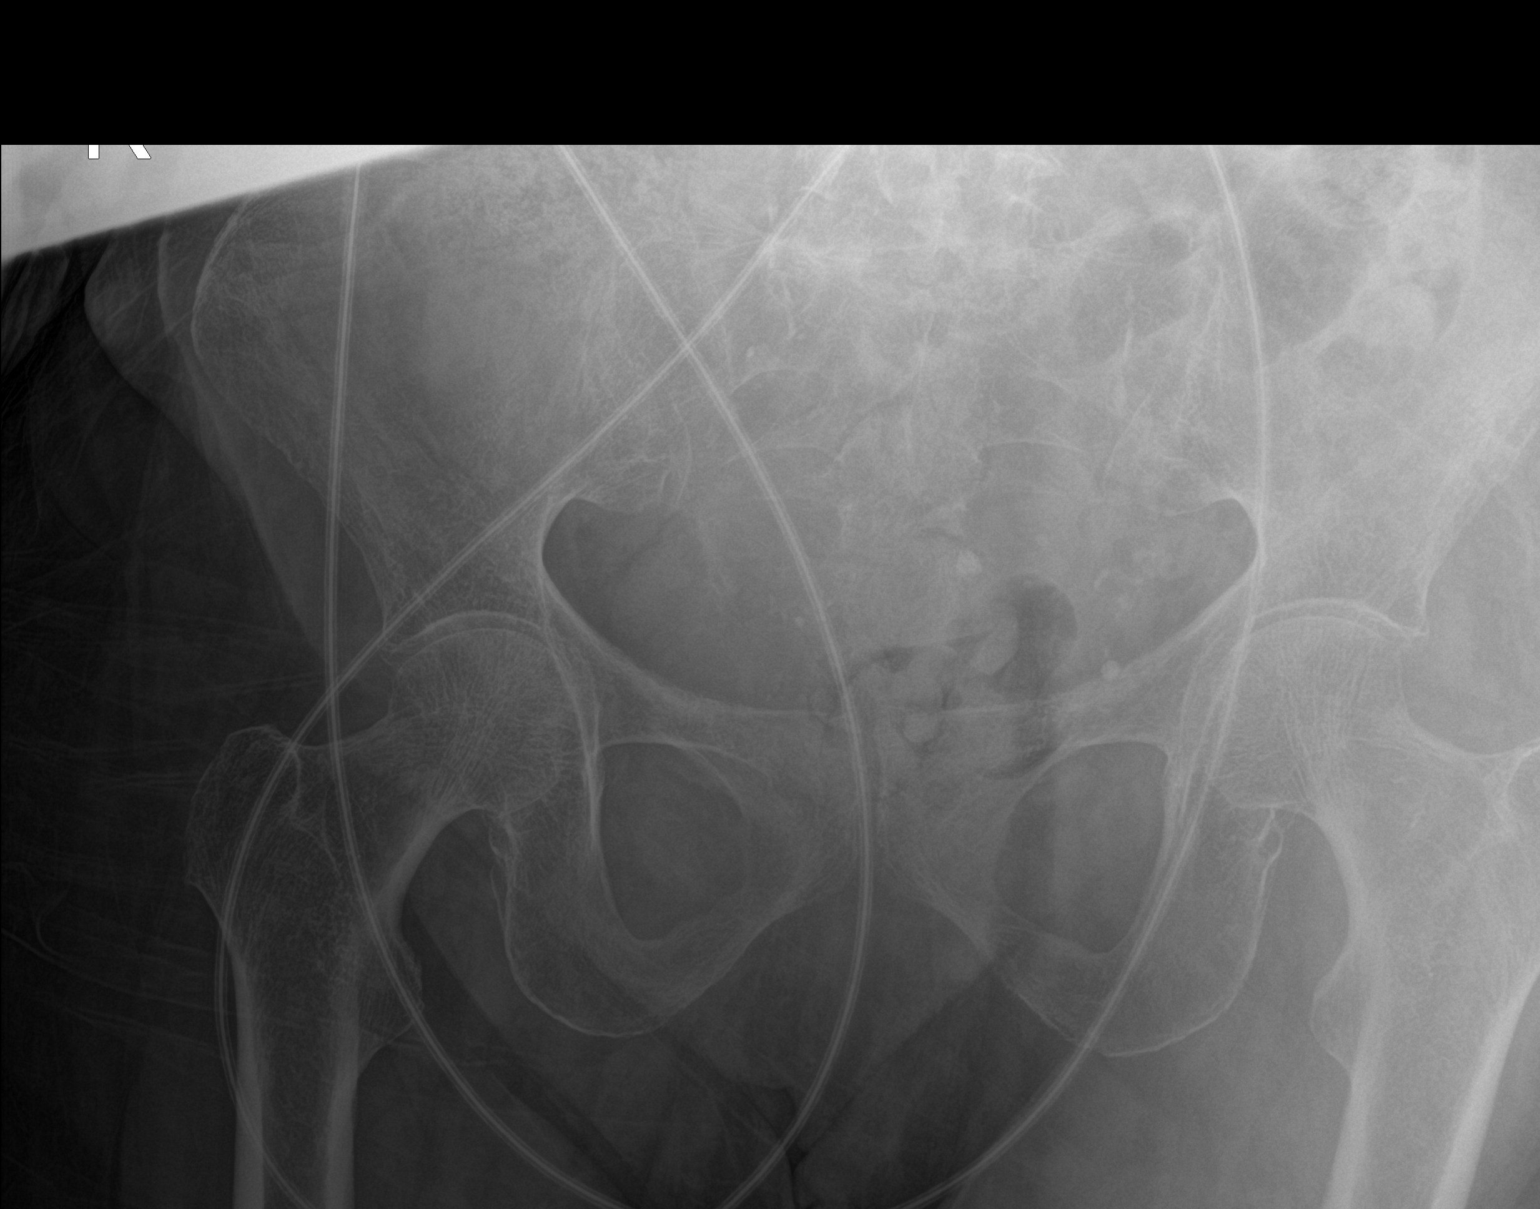
[im 2/2]
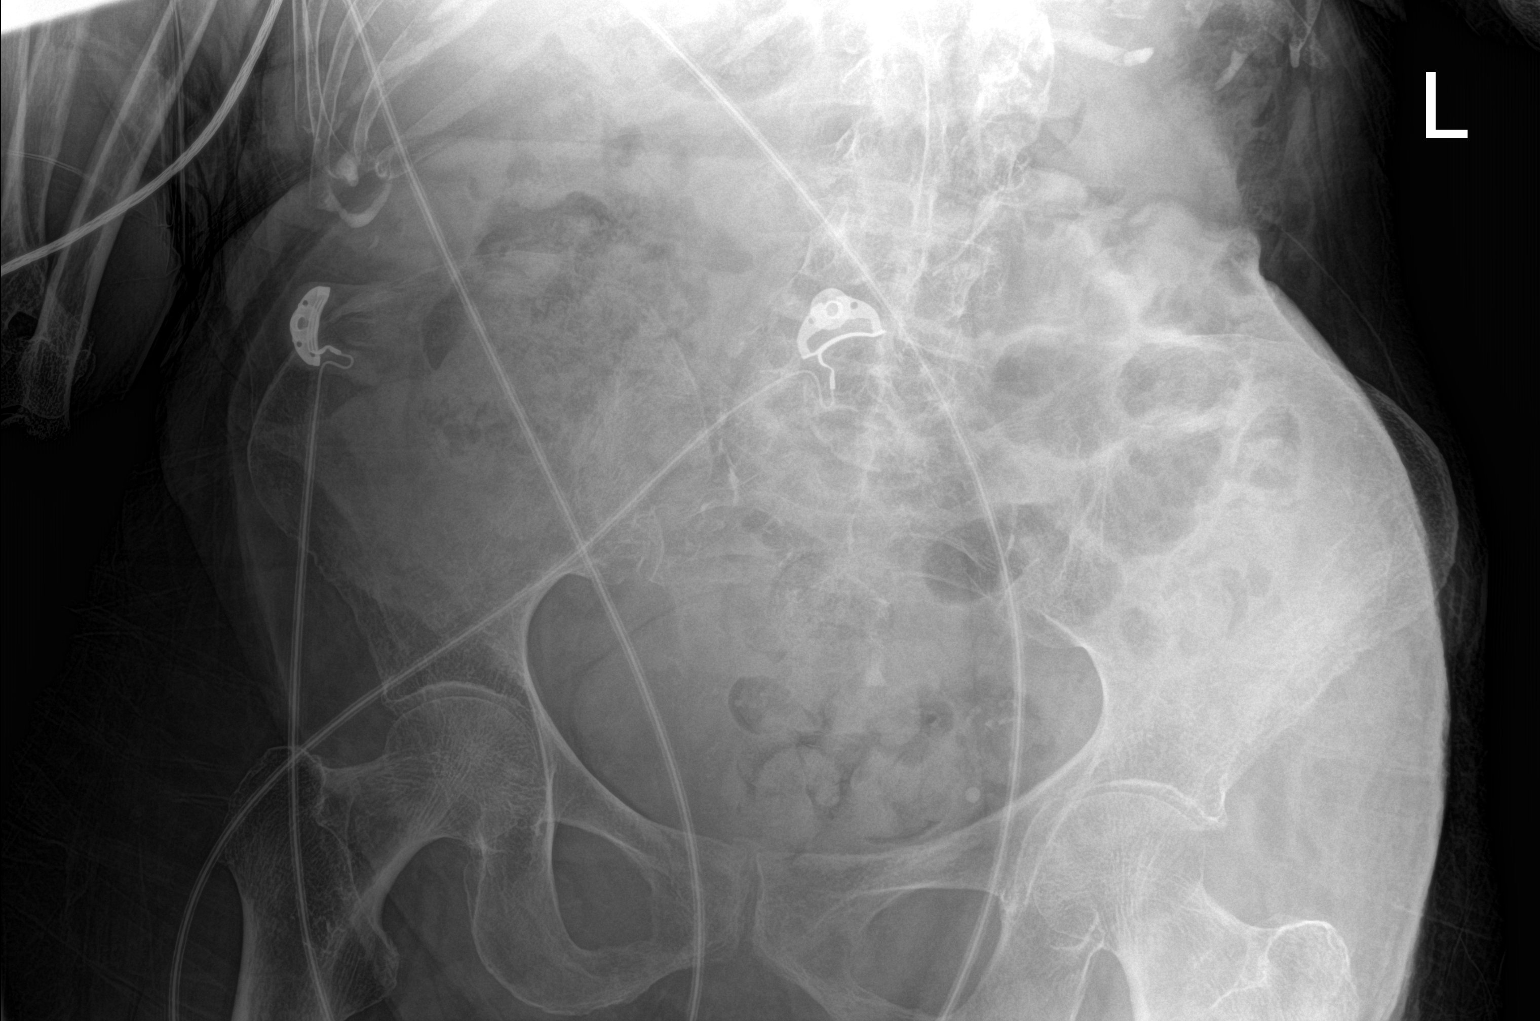

[2 of 2 positions shown; findings below may reference images not displayed]

FINDINGS: Osteopenia. There is no evidence of pelvic fracture or diastasis. No
pelvic bone lesions are seen.Degenerative changes of the lumbar
spine. Pelvic phleboliths. Limited assessment of the sacrum
secondary to overlapping bowel contents.
IMPRESSION: No acute fracture or dislocation. If persistent concern for
nondisplaced hip or pelvic fracture, recommend dedicated additional
radiographs.

## 2022-03-29 IMAGING — CT CT HEAD W/O CM
3 of 4 series · 13 of 47 positions shown, 15 images · non-contrast
Comparison: 10/31/2020

CLINICAL DATA: Facial trauma, right forehead hematoma



[Series 3: head wo · axial · 0.32mm/px · z∈[-232,-117]mm · 7 of 33 slices shown, 9 images]
[im 5/33  brain]
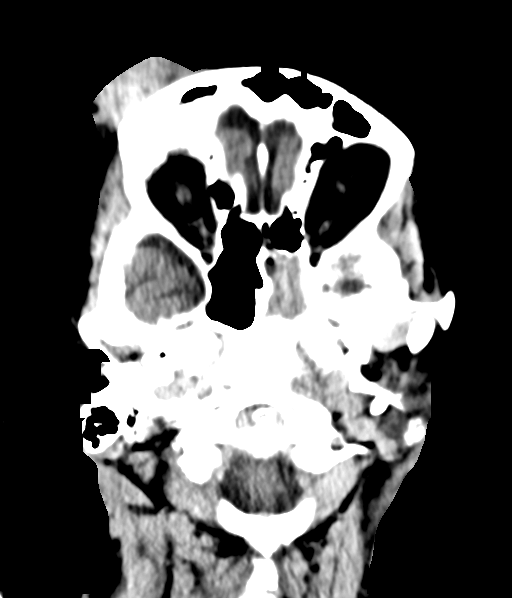
[im 5/33  bone]
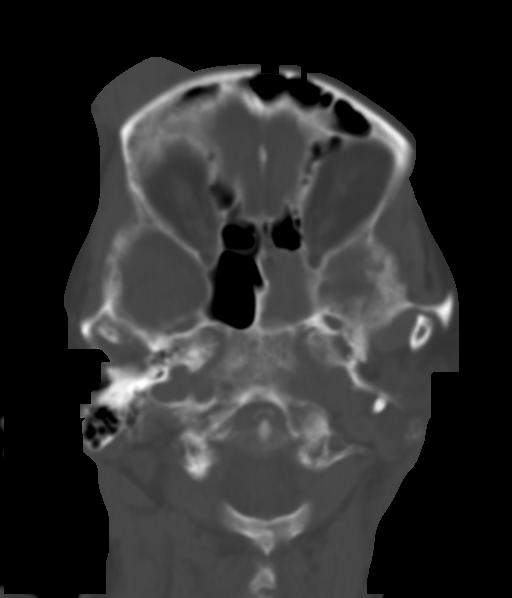
[im 9/33  brain]
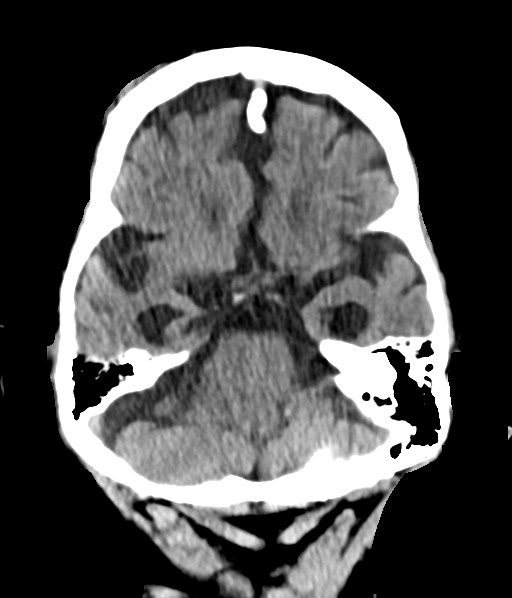
[im 13/33  brain]
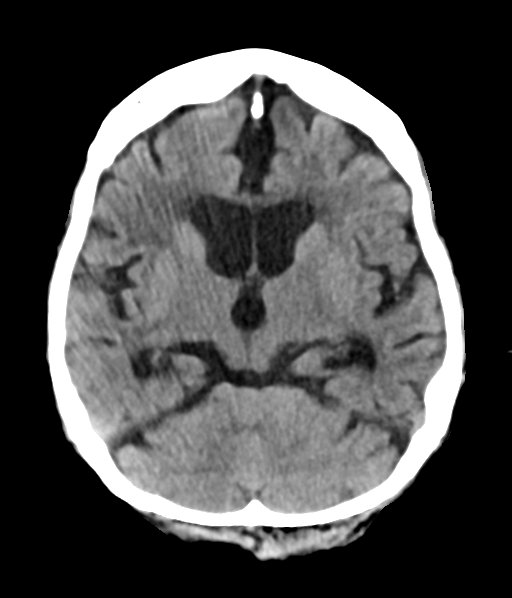
[im 17/33  brain]
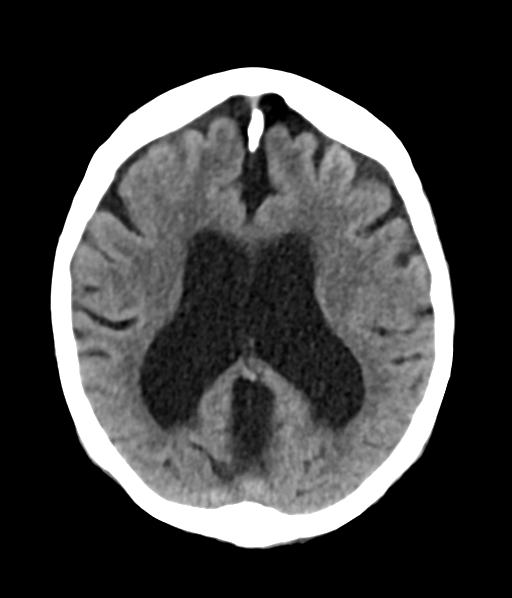
[im 21/33  brain]
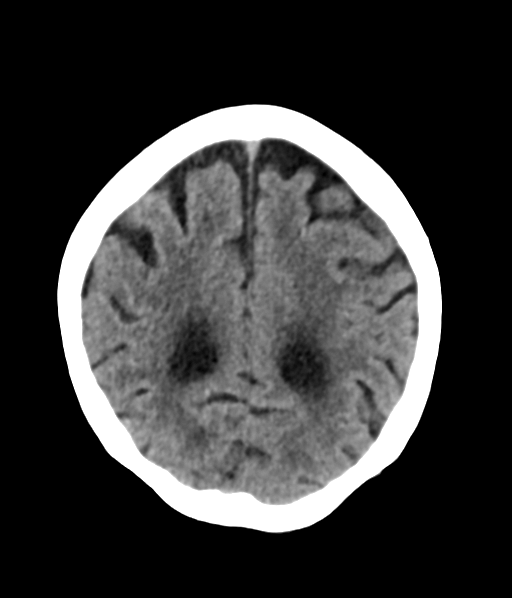
[im 21/33  bone]
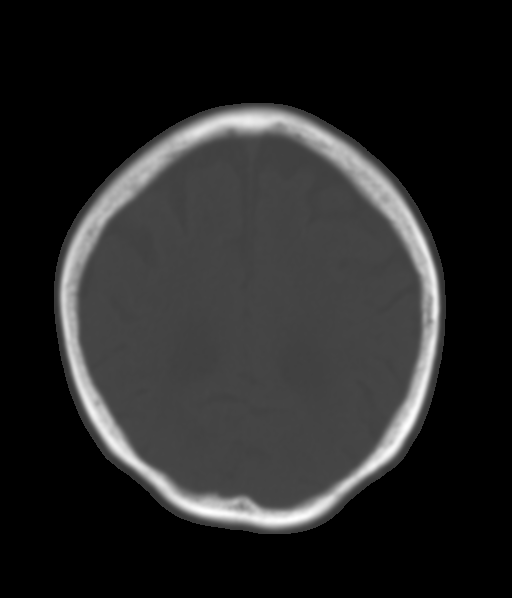
[im 25/33  brain]
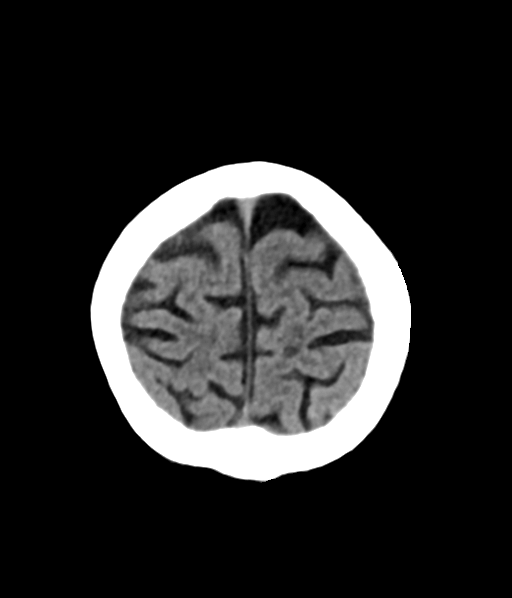
[im 29/33  brain]
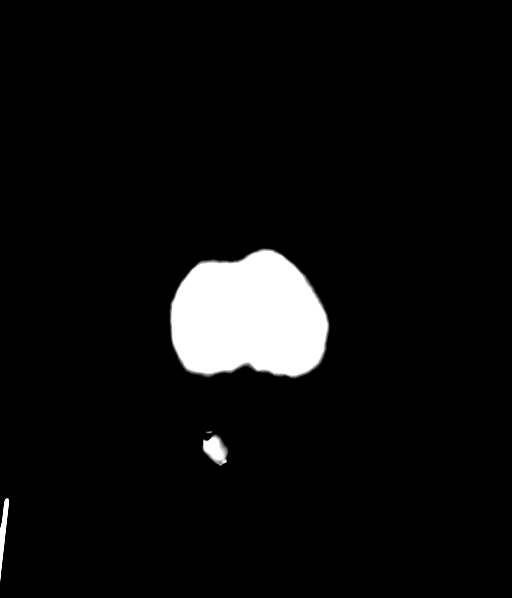

[Series 5: cor soft · coronal · 0.31mm/px · 3 of 64 slices shown]
[im 22/64  brain]
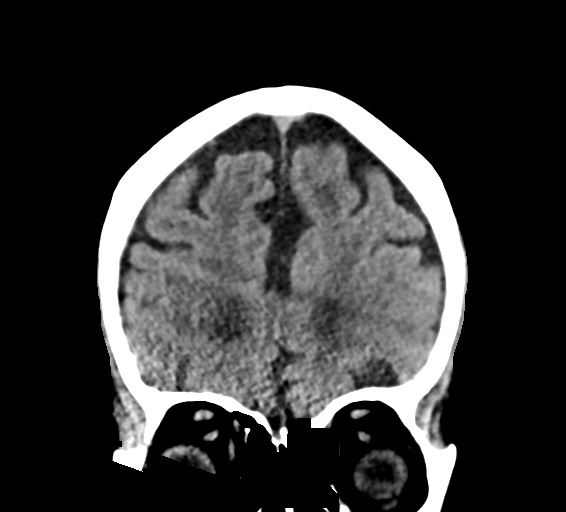
[im 29/64  brain]
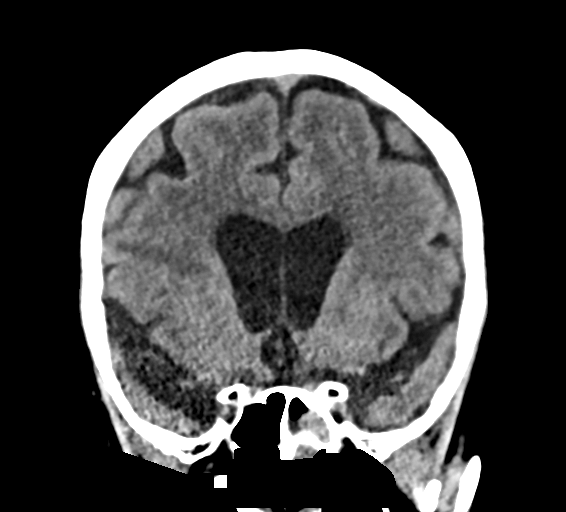
[im 36/64  brain]
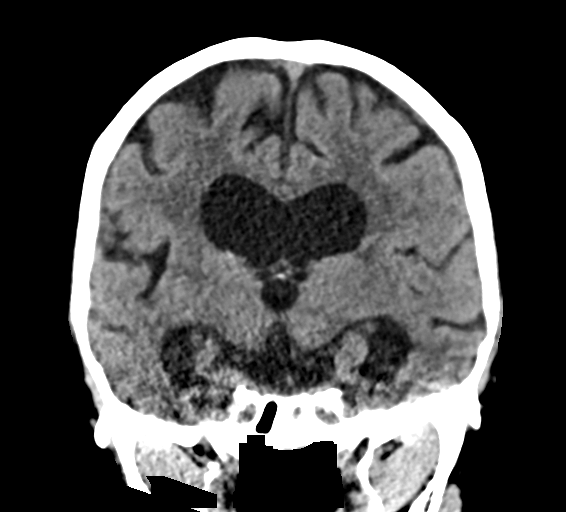

[Series 6: sag soft · sagittal · 0.31mm/px · 3 of 52 slices shown]
[im 23/52  brain]
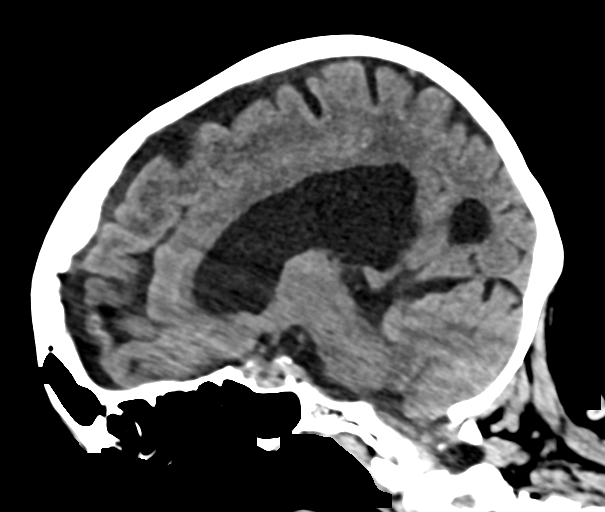
[im 25/52  brain]
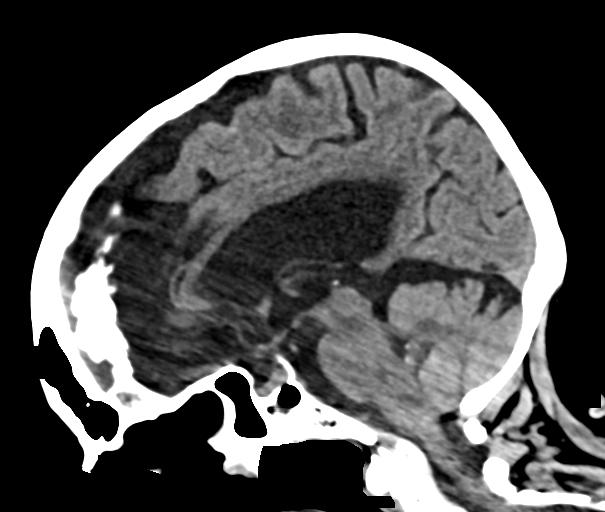
[im 28/52  brain]
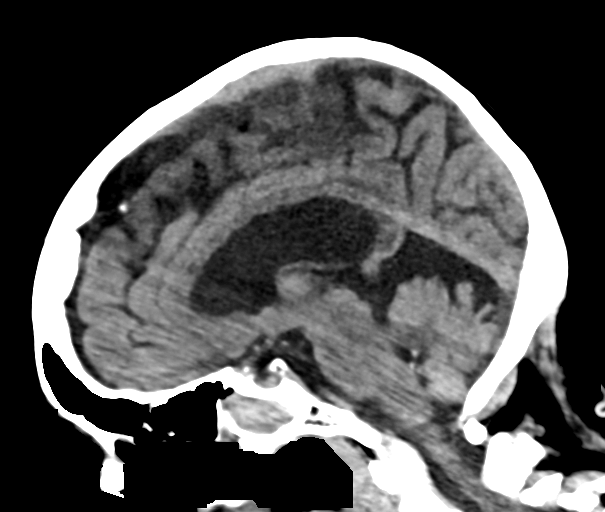

[13 of 47 positions shown; findings below may reference images not displayed]

FINDINGS: CT HEAD FINDINGS

Brain: No evidence of acute infarction, hemorrhage, hydrocephalus,
extra-axial collection or mass lesion/mass effect. Mild
periventricular white matter hypodensity and global cerebral volume
loss.

Vascular: No hyperdense vessel or unexpected calcification.

CT FACIAL BONES FINDINGS

Skull: Normal. Negative for fracture or focal lesion.

Facial bones: No displaced fractures or dislocations.

Sinuses/Orbits: No acute finding.

Other: Large superficial hematoma of the right forehead (series 3,
image 31).

CT CERVICAL SPINE FINDINGS

Alignment: Minimal multilevel degenerative anterolisthesis.

Skull base and vertebrae: No acute fracture. No primary bone lesion
or focal pathologic process.

Soft tissues and spinal canal: No prevertebral fluid or swelling. No
visible canal hematoma.

Disc levels: Mild to moderate multilevel disc space height loss and
osteophytosis throughout, worst at C6-C7.

Upper chest: Negative.

Other: None.
IMPRESSION: 1. No acute intracranial pathology. Small-vessel white matter
disease and global cerebral volume loss.
2. Soft tissue hematoma of the right forehead.
3. No displaced fracture or dislocation of the facial bones.
4. No fracture or static subluxation of the cervical spine.
Multilevel cervical disc degenerative disease.
